# Patient Record
Sex: Male | Born: 1955 | Race: White | Hispanic: No | Marital: Married | Smoking: Former smoker
Health system: Southern US, Community
[De-identification: ages and names within clinical notes are randomized; demographics above are authoritative.]

## PROBLEM LIST (undated history)

## (undated) DIAGNOSIS — Z9289 Personal history of other medical treatment: Secondary | ICD-10-CM

## (undated) DIAGNOSIS — D473 Essential (hemorrhagic) thrombocythemia: Secondary | ICD-10-CM

## (undated) DIAGNOSIS — M1990 Unspecified osteoarthritis, unspecified site: Secondary | ICD-10-CM

## (undated) DIAGNOSIS — I82409 Acute embolism and thrombosis of unspecified deep veins of unspecified lower extremity: Secondary | ICD-10-CM

## (undated) DIAGNOSIS — F32A Depression, unspecified: Secondary | ICD-10-CM

## (undated) DIAGNOSIS — R161 Splenomegaly, not elsewhere classified: Secondary | ICD-10-CM

## (undated) DIAGNOSIS — I10 Essential (primary) hypertension: Secondary | ICD-10-CM

## (undated) DIAGNOSIS — N400 Enlarged prostate without lower urinary tract symptoms: Secondary | ICD-10-CM

## (undated) DIAGNOSIS — G4730 Sleep apnea, unspecified: Secondary | ICD-10-CM

## (undated) DIAGNOSIS — I739 Peripheral vascular disease, unspecified: Secondary | ICD-10-CM

## (undated) DIAGNOSIS — D75839 Thrombocytosis, unspecified: Secondary | ICD-10-CM

## (undated) DIAGNOSIS — H269 Unspecified cataract: Secondary | ICD-10-CM

## (undated) DIAGNOSIS — F419 Anxiety disorder, unspecified: Secondary | ICD-10-CM

## (undated) DIAGNOSIS — R51 Headache: Secondary | ICD-10-CM

## (undated) DIAGNOSIS — R519 Headache, unspecified: Secondary | ICD-10-CM

## (undated) DIAGNOSIS — K219 Gastro-esophageal reflux disease without esophagitis: Secondary | ICD-10-CM

## (undated) HISTORY — DX: Essential (hemorrhagic) thrombocythemia: D47.3

## (undated) HISTORY — PX: OTHER SURGICAL HISTORY: SHX169

## (undated) HISTORY — DX: Gastro-esophageal reflux disease without esophagitis: K21.9

## (undated) HISTORY — DX: Depression, unspecified: F32.A

## (undated) HISTORY — PX: JOINT REPLACEMENT: SHX530

## (undated) HISTORY — PX: TRIGGER FINGER RELEASE: SHX641

## (undated) HISTORY — PX: EYE SURGERY: SHX253

## (undated) HISTORY — PX: TONSILLECTOMY: SUR1361

## (undated) HISTORY — PX: NASAL SINUS SURGERY: SHX719

## (undated) HISTORY — DX: Benign prostatic hyperplasia without lower urinary tract symptoms: N40.0

## (undated) HISTORY — DX: Unspecified cataract: H26.9

## (undated) HISTORY — DX: Essential (primary) hypertension: I10

---

## 1999-03-26 DIAGNOSIS — I82409 Acute embolism and thrombosis of unspecified deep veins of unspecified lower extremity: Secondary | ICD-10-CM

## 1999-03-26 HISTORY — PX: KNEE SURGERY: SHX244

## 1999-03-26 HISTORY — DX: Acute embolism and thrombosis of unspecified deep veins of unspecified lower extremity: I82.409

## 2005-03-25 HISTORY — PX: ULNAR NERVE TRANSPOSITION: SHX2595

## 2006-03-25 DIAGNOSIS — Z9289 Personal history of other medical treatment: Secondary | ICD-10-CM

## 2006-03-25 HISTORY — DX: Personal history of other medical treatment: Z92.89

## 2011-08-12 ENCOUNTER — Encounter: Payer: Self-pay | Admitting: Family Medicine

## 2011-08-12 ENCOUNTER — Ambulatory Visit (INDEPENDENT_AMBULATORY_CARE_PROVIDER_SITE_OTHER): Payer: BC Managed Care – PPO | Admitting: Family Medicine

## 2011-08-12 VITALS — BP 112/50 | HR 88 | Temp 97.9°F | Ht 71.0 in | Wt 177.0 lb

## 2011-08-12 DIAGNOSIS — Z136 Encounter for screening for cardiovascular disorders: Secondary | ICD-10-CM

## 2011-08-12 DIAGNOSIS — D473 Essential (hemorrhagic) thrombocythemia: Secondary | ICD-10-CM | POA: Insufficient documentation

## 2011-08-12 DIAGNOSIS — I10 Essential (primary) hypertension: Secondary | ICD-10-CM

## 2011-08-12 DIAGNOSIS — N400 Enlarged prostate without lower urinary tract symptoms: Secondary | ICD-10-CM

## 2011-08-12 LAB — LIPID PANEL
HDL: 47.9 mg/dL (ref >39.00)
Total CHOL/HDL Ratio: 3
Triglycerides: 87 mg/dL (ref 0.0–149.0)
VLDL: 17.4 mg/dL (ref 0.0–40.0)

## 2011-08-12 LAB — CBC WITH DIFFERENTIAL/PLATELET
Basophils Absolute: 0.1 10*3/uL (ref 0.0–0.1)
Hemoglobin: 15.1 g/dL (ref 13.0–17.0)
Lymphocytes Relative: 10.3 % — ABNORMAL LOW (ref 12.0–46.0)
Monocytes Relative: 7 % (ref 3.0–12.0)
Neutro Abs: 5.5 10*3/uL (ref 1.4–7.7)
Neutrophils Relative %: 78.6 % — ABNORMAL HIGH (ref 43.0–77.0)
RDW: 13.4 % (ref 11.5–14.6)

## 2011-08-12 LAB — COMPREHENSIVE METABOLIC PANEL
ALT: 14 U/L (ref 0–53)
AST: 18 U/L (ref 0–37)
BUN: 24 mg/dL — ABNORMAL HIGH (ref 6–23)
Creatinine, Ser: 1 mg/dL (ref 0.4–1.5)
GFR: 79.07 mL/min (ref >60.00)
Total Bilirubin: 0.8 mg/dL (ref 0.3–1.2)

## 2011-08-12 MED ORDER — DOXAZOSIN MESYLATE 1 MG PO TABS: 1.0000 mg | ORAL_TABLET | Freq: Every day | ORAL | Status: DC

## 2011-08-12 MED ORDER — HYDROXYUREA 500 MG PO CAPS: ORAL_CAPSULE | ORAL | Status: DC

## 2011-08-12 MED ORDER — LISINOPRIL 10 MG PO TABS: 10.0000 mg | ORAL_TABLET | Freq: Every day | ORAL | Status: DC

## 2011-08-12 MED ORDER — NAPROXEN 500 MG PO TABS: 500.0000 mg | ORAL_TABLET | Freq: Two times a day (BID) | ORAL | Status: DC

## 2011-08-12 NOTE — Progress Notes (Signed)
Subjective:    Patient ID: Dennis Martin, male    DOB: 1955/10/12, 56 y.o.   MRN: [id number]  HPI  56 yo here to establish care.  Recently moved here from [facility] for his job-works for [employer].    Essential Thrombocytosis- diagnosed in 2006. Takes hydroxyurea. CBC has been stable.  Does not have a local hematologist- his platelets have been in 300-400s.  HTN- on lisinopril 10 mg daily. Denies any HA, blurred vision, dizziness with standing, LE edema, CP or SOB.  BPH- has been on cardura 1 mg for years- feels it works "ok."  Emptying bladder more effectively than he was prior to using it.  Patient Active Problem List  Diagnoses  . Essential thrombocytosis   Past Medical History  Diagnosis Date  . BPH (benign prostatic hyperplasia)    Past Surgical History  Procedure Date  . Total knee arthroplasty 2001    bilateral   History  Substance Use Topics  . Smoking status: Former Games developer  . Smokeless tobacco: Not on file   Comment: Quit 2005  . Alcohol Use: Not on file   No family history on file. No Known Allergies Current Outpatient Prescriptions on File Prior to Visit  Medication Sig Dispense Refill  . doxazosin (CARDURA) 1 MG tablet Take 1 tablet (1 mg total) by mouth at bedtime.  90 tablet  3  . lisinopril (PRINIVIL,ZESTRIL) 10 MG tablet Take 1 tablet (10 mg total) by mouth daily.  90 tablet  3  . loratadine (CLARITIN) 10 MG tablet Take 10 mg by mouth daily.       The PMH, PSH, Social History, Family History, Medications, and allergies have been reviewed in [facility], and have been updated if relevant.   Review of Systems See HPI Patient reports no  vision/ hearing changes,anorexia, weight change, fever ,adenopathy, persistant / recurrent hoarseness, swallowing issues, chest pain, edema,persistant / recurrent cough, hemoptysis, dyspnea(rest, exertional, paroxysmal nocturnal), gastrointestinal  bleeding (melena, rectal bleeding), abdominal pain, excessive heart burn, GU  symptoms(dysuria, hematuria, pyuria, voiding/incontinence  Issues) syncope, focal weakness, severe memory loss, concerning skin lesions, depression, anxiety, abnormal bruising/bleeding, major joint swelling.       Objective:   Physical Exam BP 112/50  Pulse 88  Temp(Src) 97.9 F (36.6 C) (Oral)  Ht 5\' 11"  (1.803 m)  Wt 177 lb (80.287 kg)  BMI 24.69 kg/m2 General:  pleasant male in NAD Eyes:  PERRL Ears:  External ear exam shows no significant lesions or deformities.  Otoscopic examination reveals clear canals, tympanic membranes are intact bilaterally without bulging, retraction, inflammation or discharge. Hearing is grossly normal bilaterally. Nose:  External nasal examination shows no deformity or inflammation. Nasal mucosa are pink and moist without lesions or exudates. Mouth:  Oral mucosa and oropharynx without lesions or exudates.  Teeth in good repair. Neck:  no carotid bruit or thyromegaly no cervical or supraclavicular lymphadenopathy  Lungs:  Normal respiratory effort, chest expands symmetrically. Lungs are clear to auscultation, no crackles or wheezes. Heart:  Normal rate and regular rhythm. S1 and S2 normal without gallop, murmur, click, rub or other extra sounds. Abdomen:  Bowel sounds positive,abdomen soft and non-tender without masses, organomegaly or hernias noted. Pulses:  R and L posterior tibial pulses are full and equal bilaterally  Extremities:  no edema     Assessment & Plan:   1. Essential thrombocytosis  Stable, refilled hydroxyurea.  Refer to hematology so he can establish with local hematologist.  CBC today. CBC with Differential, Ambulatory referral to Hematology,  Comprehensive metabolic panel  2. Screening for ischemic heart disease  Lipid Panel  3. HTN (hypertension)  Stable on current dose of lisinopril.   4. BPH (benign prostatic hyperplasia)  Stable with cardura.

## 2011-08-12 NOTE — Patient Instructions (Signed)
Wonderful to meet you. Please stop by to see Shirlee Limerick on your way out to set up your hematology referral. Make an appointment to see Dr. Patsy Lager for hip pain at the front desk. We will call you with your lab work.

## 2011-08-15 ENCOUNTER — Telehealth: Payer: Self-pay

## 2011-08-15 NOTE — Telephone Encounter (Signed)
Noted- quantity should be 12. Thank you.

## 2011-08-15 NOTE — Telephone Encounter (Signed)
Dennis Martin  [facility] request clarification on Hydrea instructions one by mouth on Fridays but quantity was 90.Please advise. Ref # [id number].

## 2011-08-16 ENCOUNTER — Telehealth: Payer: Self-pay | Admitting: Internal Medicine

## 2011-08-16 NOTE — Telephone Encounter (Signed)
l/m re:new pt appt  aom 

## 2011-08-16 NOTE — Telephone Encounter (Signed)
pt called back and made appt for 8/12-his req,he req this as he just had lab work and this is to estab care.

## 2011-08-21 ENCOUNTER — Other Ambulatory Visit: Payer: Self-pay | Admitting: *Deleted

## 2011-08-21 MED ORDER — HYDROXYUREA 500 MG PO CAPS: ORAL_CAPSULE | ORAL | Status: DC

## 2011-08-21 NOTE — Telephone Encounter (Signed)
Thank you :)

## 2011-08-21 NOTE — Telephone Encounter (Signed)
There has been some confusion on how pt takes hyroxyurea.  Chart has that he takes one on fridays, which is what was understood when he gave [place] his medicine information at first visit, but he says he takes one daily and takes two on fridays, for a total of 102 for a 90 day period.  We have already sent in 12, so today I am sending in 90 to the pharmacy, with refills.

## 2011-08-22 NOTE — Telephone Encounter (Signed)
Ethelene Browns pharmacist with [facility] called & confirmed cancelling Hyroxyurea one tab on Friday and update with new rx 1 daily and 2 on Fridays(rx received on 08/21/11.)

## 2011-08-26 ENCOUNTER — Encounter: Payer: Self-pay | Admitting: Family Medicine

## 2011-08-26 ENCOUNTER — Telehealth: Payer: Self-pay | Admitting: Internal Medicine

## 2011-08-26 ENCOUNTER — Ambulatory Visit (INDEPENDENT_AMBULATORY_CARE_PROVIDER_SITE_OTHER): Payer: BC Managed Care – PPO | Admitting: Family Medicine

## 2011-08-26 VITALS — BP 120/78 | HR 75 | Temp 97.6°F | Ht 71.0 in | Wt 176.0 lb

## 2011-08-26 DIAGNOSIS — M76899 Other specified enthesopathies of unspecified lower limb, excluding foot: Secondary | ICD-10-CM

## 2011-08-26 DIAGNOSIS — G5700 Lesion of sciatic nerve, unspecified lower limb: Secondary | ICD-10-CM

## 2011-08-26 DIAGNOSIS — M67959 Unspecified disorder of synovium and tendon, unspecified thigh: Secondary | ICD-10-CM

## 2011-08-26 DIAGNOSIS — M7060 Trochanteric bursitis, unspecified hip: Secondary | ICD-10-CM

## 2011-08-26 MED ORDER — DICLOFENAC SODIUM 75 MG PO TBEC: 75.0000 mg | DELAYED_RELEASE_TABLET | Freq: Two times a day (BID) | ORAL | Status: DC

## 2011-08-26 NOTE — Patient Instructions (Signed)
PIRIFORMIS SYNDROME REHAB 1. Work on pretzel stretching, shoulder back and leg draped in front. 3-5 sets, 30 sec.Marland Kitchen Hip Abductions - 3 sets of 15, then progress to 30 2. hip abductor rotations. standing, hip flexion and rotation outward then inward. 3 sets, 15 reps. when can do comfortably, add ankle weights starting at 2 pounds.  3. cross over stretching - shoulder back to ground, same side leg crossover. 3-5 sets for 30 min..  4. SINK STRETCH - YOU CAN DO THIS WHENEVER YOU WANT DURING THE DAY  Tennis ball underneath area in buttocks - on a hard surface underneath Can also massage this area with an Hydrologist or hand

## 2011-08-26 NOTE — Progress Notes (Signed)
HEALTHCARE at [facility]  Patient Name: Dennis Martin Date of Birth: 05/26/1955 Medical Record Number: [id number] Gender: male Date of Encounter: 08/26/2011  History of Present Illness:  Dennis Martin is a 56 y.o. very pleasant male patient who presents with the following:  Normally will sleep in his left hip and then starteed on the right. About a month ago, then would alternate left and right.  Pressure on the left  Patient presents and has been having symptoms that began more on the R, then alternating L and R. Posteriorly mostly, and then lateral. No back pain. No trauma. No radiculopathy. No groin pain.  Works at [employer] 8 hours a day. 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   Has taken some naprosyn  Patient Active Problem List  Diagnoses  . Essential thrombocytosis  . HTN (hypertension)  . BPH (benign prostatic hyperplasia)   Past Medical History  Diagnosis Date  . BPH (benign prostatic hyperplasia)    Past Surgical History  Procedure Date  . Total knee arthroplasty 2001    bilateral   History  Substance Use Topics  . Smoking status: Former Games developer  . Smokeless tobacco: Not on file   Comment: Quit 2005  . Alcohol Use: Not on file   No family history on file. No Known Allergies  Medication list has been reviewed and updated.  Prior to Admission medications   Medication Sig Start Date End Date Taking? Authorizing Provider  aspirin 325 MG tablet Take 325 mg by mouth daily.    Historical Provider, MD  doxazosin (CARDURA) 1 MG tablet Take 1 tablet (1 mg total) by mouth at bedtime. 08/12/11   Dianne Dun, MD  hydroxyurea (HYDREA) 500 MG capsule Take one by mouth daily and two by mouth on fridays. 08/21/11   Dianne Dun, MD  lisinopril (PRINIVIL,ZESTRIL) 10 MG tablet Take 1 tablet (10 mg total) by mouth daily. 08/12/11   Dianne Dun, MD  loratadine (CLARITIN) 10 MG tablet Take 10 mg by mouth daily.    Historical Provider, MD  Multiple Vitamins-Minerals (CENTRUM  SILVER PO) Take by mouth. Take one by mouth daily.    Historical Provider, MD  naproxen (NAPROSYN) 500 MG tablet Take 1 tablet (500 mg total) by mouth 2 (two) times daily with a meal. 08/12/11   Dianne Dun, MD    Review of Systems:   GEN: No fevers, chills. Nontoxic. Primarily MSK c/o today. MSK: Detailed in the HPI GI: tolerating PO intake without difficulty Neuro: No numbness, parasthesias, or tingling associated. Otherwise the pertinent positives of the ROS are noted above.    Physical Examination: Filed Vitals:   08/26/11 0802  BP: 120/78  Pulse: 75  Temp: 97.6 F (36.4 C)   Filed Vitals:   08/26/11 0802  Height: 5\' 11"  (1.803 m)  Weight: 176 lb (79.833 kg)   Body mass index is 24.55 kg/(m^2).   GEN: WDWN, NAD, Non-toxic, Alert & Oriented x 3 HEENT: Atraumatic, Normocephalic.  Ears and Nose: No external deformity. EXTR: No clubbing/cyanosis/edema NEURO: Normal gait.  PSYCH: Normally interactive. Conversant. Not depressed or anxious appearing.  Calm demeanor.   HIP EXAM: SIDE: b ROM: Abduction, Flexion, Internal and External range of motion: excellent and full Pain with terminal IROM and EROM: no GTB: mild GTB on L SLR: NEG Knees: No effusion FABER: NT REVERSE FABER: NT, neg Piriformis: NT at direct palpation Str: flexion: 5/5 abduction: 5/5 adduction: 5/5 Strength testing non-tender   Back: full  ROM, neurovascularly intact, DTR 2+, pulses 2+.  SLR  Assessment and Plan:  1. Piriformis syndrome   2. Trochanteric bursitis   3. Tendinopathy of gluteal region    >25 minutes spent in face to face time with patient, >50% spent in counselling or coordination of care: discussion of anatomy, pathophys, rehab. Mild all of above, suspect irritating sciatic nerve some with sitting and piriformis. Deconditioning contributing  Reviewed rehab from [facility] F/u 6 weeks if not better  Orders Today: No orders of the defined types were placed in this encounter.      Medications Today: Meds ordered this encounter  Medications  . diclofenac (VOLTAREN) 75 MG EC tablet    Sig: Take 1 tablet (75 mg total) by mouth 2 (two) times daily.    Dispense:  60 tablet    Refill:  3     Hannah Beat, MD 08/26/2011 8:12 AM

## 2011-08-26 NOTE — Telephone Encounter (Signed)
Referred by Dr. [name] Dx- Thombocytosis

## 2011-09-12 NOTE — Telephone Encounter (Signed)
Can this encounter be closed?

## 2011-09-13 NOTE — Telephone Encounter (Signed)
This has been taken care of.

## 2011-10-30 ENCOUNTER — Telehealth: Payer: Self-pay | Admitting: Internal Medicine

## 2011-10-30 NOTE — Telephone Encounter (Signed)
pt called and r/s new pt appt to 8/19

## 2011-11-04 ENCOUNTER — Other Ambulatory Visit: Payer: BC Managed Care – PPO | Admitting: Lab

## 2011-11-04 ENCOUNTER — Ambulatory Visit: Payer: BC Managed Care – PPO

## 2011-11-04 ENCOUNTER — Ambulatory Visit: Payer: BC Managed Care – PPO | Admitting: Internal Medicine

## 2011-11-11 ENCOUNTER — Telehealth: Payer: Self-pay | Admitting: Internal Medicine

## 2011-11-11 ENCOUNTER — Ambulatory Visit (HOSPITAL_BASED_OUTPATIENT_CLINIC_OR_DEPARTMENT_OTHER): Payer: BC Managed Care – PPO | Admitting: Internal Medicine

## 2011-11-11 ENCOUNTER — Other Ambulatory Visit (HOSPITAL_BASED_OUTPATIENT_CLINIC_OR_DEPARTMENT_OTHER): Payer: BC Managed Care – PPO

## 2011-11-11 ENCOUNTER — Ambulatory Visit: Payer: BC Managed Care – PPO

## 2011-11-11 VITALS — BP 113/72 | HR 68 | Temp 96.7°F | Resp 20 | Ht 71.0 in | Wt 179.8 lb

## 2011-11-11 DIAGNOSIS — D473 Essential (hemorrhagic) thrombocythemia: Secondary | ICD-10-CM

## 2011-11-11 DIAGNOSIS — I10 Essential (primary) hypertension: Secondary | ICD-10-CM

## 2011-11-11 LAB — COMPREHENSIVE METABOLIC PANEL
ALT: 14 U/L (ref 0–53)
AST: 18 U/L (ref 0–37)
Albumin: 4.1 g/dL (ref 3.5–5.2)
BUN: 27 mg/dL — ABNORMAL HIGH (ref 6–23)
Calcium: 9.5 mg/dL (ref 8.4–10.5)
Chloride: 101 mEq/L (ref 96–112)
Potassium: 4.9 mEq/L (ref 3.5–5.3)
Sodium: 140 mEq/L (ref 135–145)
Total Protein: 6 g/dL (ref 6.0–8.3)

## 2011-11-11 LAB — CBC WITH DIFFERENTIAL/PLATELET
BASO%: 0.2 % (ref 0.0–2.0)
Basophils Absolute: 0 10*3/uL (ref 0.0–0.1)
MCH: 36.6 pg — ABNORMAL HIGH (ref 27.2–33.4)
MCV: 107.1 fL — ABNORMAL HIGH (ref 79.3–98.0)
MONO#: 0.6 10*3/uL (ref 0.1–0.9)
NEUT#: 5.2 10*3/uL (ref 1.5–6.5)
NEUT%: 73 % (ref 39.0–75.0)
Platelets: 382 10*3/uL (ref 140–400)
RBC: 4.29 10*6/uL (ref 4.20–5.82)
RDW: 12.4 % (ref 11.0–14.6)

## 2011-11-11 NOTE — Telephone Encounter (Signed)
appts made and printed for pt aom °

## 2011-11-11 NOTE — Progress Notes (Signed)
Dennis Martin [phone]   [phone]  CONSULT NOTE  REASON FOR CONSULTATION:  56 years old white male with essential thrombocythemia.   HPI Dennis Martin is a 56 y.o. male was past medical history significant for hypertension, benign prostatic hypertrophy as well as essential thrombocythemia diagnosed in 2006 under the care of Dr. Mariah Milling in [facility] after having a bone marrow biopsy and aspirate as the patient was complaining of severe pain and heaviness in his feet. Blood work at that time showed elevated platelets count. He was started on hydroxyurea and for the last 4 years his dose has been 500 mg by mouth daily except Friday he receives 1000 mg. He has been doing fine and his platelets count has always been less than 400,000 over the last 4 years. He moved recently to [facility] and establish care with a primary care physician, Dr. Kerin Perna.  He recommended for him to establish care with a local hematologist for evaluation of his essential thrombocythemia. The patient is feeling fine with no specific complaints today except for occasional night sweats. He has no significant adverse effect from his hydroxyurea.  He denied having any significant nausea or vomiting, no chest pain or shortness breath, no cough or hemoptysis. The patient denied having any bleeding or bruises, he denied having any significant weight loss. He has a history of splenomegaly and occasional early satiety. Family history unremarkable for any malignancy. The patient is married and has 2 sons. He has a history of smoking but quit in 2005. He drinks alcohol on daily basis but no history of drug abuse. @SFHPI @  Past Medical History  Diagnosis Date  . BPH (benign prostatic hyperplasia)     Past Surgical History  Procedure Date  . Total knee arthroplasty 2001    bilateral    No family history on file.  Social History History  Substance Use Topics  .  Smoking status: Former Games developer  . Smokeless tobacco: Not on file   Comment: Quit 2005  . Alcohol Use: Not on file    No Known Allergies  Current Outpatient Prescriptions  Medication Sig Dispense Refill  . aspirin 325 MG tablet Take 325 mg by mouth daily.      Marland Kitchen doxazosin (CARDURA) 1 MG tablet Take 1 tablet (1 mg total) by mouth at bedtime.  90 tablet  3  . hydroxyurea (HYDREA) 500 MG capsule Take one by mouth daily and two by mouth on fridays.  90 capsule  3  . lisinopril (PRINIVIL,ZESTRIL) 10 MG tablet Take 1 tablet (10 mg total) by mouth daily.  90 tablet  3  . loratadine (CLARITIN) 10 MG tablet Take 10 mg by mouth daily.      . Multiple Vitamins-Minerals (CENTRUM SILVER PO) Take by mouth. Take one by mouth daily.      . naproxen (NAPROSYN) 500 MG tablet Take 1 tablet (500 mg total) by mouth 2 (two) times daily with a meal.  180 tablet  3    Review of Systems  A comprehensive review of systems was negative.  Physical Exam  AVW:UJWJX, healthy, no distress, well nourished and well developed SKIN: skin color, texture, turgor are normal HEAD: Normocephalic, No masses, lesions, tenderness or abnormalities EYES: normal EARS: External ears normal OROPHARYNX:no exudate and no erythema  NECK: supple, no adenopathy LYMPH:  no palpable lymphadenopathy, no hepatosplenomegaly LUNGS: clear to auscultation  HEART: regular rate & rhythm, no murmurs and no gallops ABDOMEN:abdomen soft, non-tender,  normal bowel sounds and no masses or organomegaly BACK: Back symmetric, no curvature. EXTREMITIES:no joint deformities, effusion, or inflammation, no edema, no skin discoloration, no clubbing, no cyanosis  NEURO: alert & oriented x 3 with fluent speech, no focal motor/sensory deficits  PERFORMANCE STATUS: ECOG 0  LABORATORY DATA: Lab Results  Component Value Date   WBC 7.1 11/11/2011   HGB 15.7 11/11/2011   HCT 45.9 11/11/2011   MCV 107.1* 11/11/2011   PLT 382 11/11/2011      Chemistry        Component Value Date/Time   NA 140 08/12/2011 1425   K 4.2 08/12/2011 1425   CL 102 08/12/2011 1425   CO2 31 08/12/2011 1425   BUN 24* 08/12/2011 1425   CREATININE 1.0 08/12/2011 1425      Component Value Date/Time   CALCIUM 9.0 08/12/2011 1425   ALKPHOS 49 08/12/2011 1425   AST 18 08/12/2011 1425   ALT 14 08/12/2011 1425   BILITOT 0.8 08/12/2011 1425       RADIOGRAPHIC STUDIES: No results found.  ASSESSMENT: This is a very pleasant 56 years old white male with history of essential thrombocythemia diagnosed in 2006 and has been on treatment with hydroxyurea for the last 7 years. The patient is tolerating his treatment fairly well with no significant adverse effect and good control of his platelets count.  PLAN: I ordered a few studies today for evaluation of his essential thrombocythemia including repeat CBC, comprehensive metabolic panel, LDH and JAK-2 mutation. I advised the patient to continue on his current dose of hydroxyurea 500 mg by mouth daily except Friday he will take 1000 mg. The patient will continue with close monitoring and repeat CBC on every 3 month basis. He would alternate visits between me and his primary care physician every 6 months. I would see the patient back for followup visit in 6 months with repeat CBC, comprehensive metabolic panel and LDH. He was advised to call me immediately if he has any concerning symptoms in the interval. I gave the patient the time to ask questions and I answered them completely to his satisfaction. He understands the risk of secondary malignancy from treatment with hydroxyurea as well as the possibility of developing other myeloproliferative disorder secondary to his essential thrombocythemia. He was advised to report to me any concerning symptoms or signs.  All questions were answered. The patient knows to call the clinic with any problems, questions or concerns. We can certainly see the patient much sooner if necessary.  Thank you so  much for allowing me to participate in the care of [name]. I will continue to follow up the patient with you and assist in his care.  I spent 30 minutes counseling the patient face to face. The total time spent in the appointment was 55 minutes.  Nancy Arvin K. 11/11/2011, 2:46 PM

## 2011-12-25 ENCOUNTER — Ambulatory Visit (INDEPENDENT_AMBULATORY_CARE_PROVIDER_SITE_OTHER): Payer: BC Managed Care – PPO

## 2011-12-25 DIAGNOSIS — Z23 Encounter for immunization: Secondary | ICD-10-CM

## 2012-02-10 ENCOUNTER — Ambulatory Visit (INDEPENDENT_AMBULATORY_CARE_PROVIDER_SITE_OTHER): Payer: BC Managed Care – PPO | Admitting: Family Medicine

## 2012-02-10 ENCOUNTER — Encounter: Payer: Self-pay | Admitting: Family Medicine

## 2012-02-10 VITALS — BP 128/72 | HR 80 | Temp 97.6°F | Wt 185.0 lb

## 2012-02-10 DIAGNOSIS — I10 Essential (primary) hypertension: Secondary | ICD-10-CM

## 2012-02-10 DIAGNOSIS — D473 Essential (hemorrhagic) thrombocythemia: Secondary | ICD-10-CM

## 2012-02-10 DIAGNOSIS — N400 Enlarged prostate without lower urinary tract symptoms: Secondary | ICD-10-CM

## 2012-02-10 LAB — CBC WITH DIFFERENTIAL/PLATELET
Basophils Absolute: 0.1 10*3/uL (ref 0.0–0.1)
Basophils Relative: 0.9 % (ref 0.0–3.0)
Eosinophils Absolute: 0.3 10*3/uL (ref 0.0–0.7)
Hemoglobin: 14.9 g/dL (ref 13.0–17.0)
MCHC: 33.3 g/dL (ref 30.0–36.0)
MCV: 107.1 fl — ABNORMAL HIGH (ref 78.0–100.0)
Monocytes Absolute: 0.6 10*3/uL (ref 0.1–1.0)
Neutro Abs: 4.4 10*3/uL (ref 1.4–7.7)
RBC: 4.18 Mil/uL — ABNORMAL LOW (ref 4.22–5.81)
RDW: 12.8 % (ref 11.5–14.6)

## 2012-02-10 MED ORDER — KETOCONAZOLE 2 % EX SHAM: MEDICATED_SHAMPOO | CUTANEOUS | Status: DC

## 2012-02-10 MED ORDER — DESONIDE 0.05 % EX CREA: TOPICAL_CREAM | Freq: Two times a day (BID) | CUTANEOUS | Status: DC

## 2012-02-10 NOTE — Patient Instructions (Addendum)
Seborrheic Keratosis  Seborrheic keratosis is a common, noncancerous (benign) skin growth that can occur anywhere on the skin. It looks like "stuck-on," waxy, rough, tan, brown, or black spots on the skin. These skin growths can be flat or raised. They are often called "barnacles" because of their pasted-on appearance. Usually, these skin growths appear in adulthood, around age 54, and increase in number as you age. They may also develop during pregnancy or following estrogen therapy. Many people may only have one growth appear in their lifetime, while some people may develop many growths.  CAUSES  It is unknown what causes these skin growths, but they appear to run in families.  SYMPTOMS  Seborrheic keratosis is often located on the face, chest, shoulders, back, or other areas. These growths are:  · Usually painless, but may become irritated and itchy.  · Yellow, brown, black, or other colors.  · Slightly raised or have a flat surface.  · Sometimes rough or wart-like in texture.  · Often waxy on the surface.  · Round or oval-shaped.  · Sometimes "stuck-on" in appearance.  · Sometimes single, but there are usually many growths.  Any growth that bleeds, itches on a regular basis, becomes inflamed, or becomes irritated needs to be evaluated by a skin specialist (dermatologist).  DIAGNOSIS  Diagnosis is mainly based on the way the growths appear. In some cases, it can be difficult to tell this type of skin growth from skin cancer. A skin growth tissue sample (biopsy)  may be used to confirm the diagnosis.  TREATMENT  Most often, treatment is not needed because the skin growths are benign. If the skin growth is irritated easily by clothing or jewelry, causing it to scab or bleed, treatment may be recommended. Patients may also choose to have the growths removed because they do not like their appearance. Most commonly, these growths are treated with cryosurgery.  In cryosurgery, liquid nitrogen is applied to "freeze" the  growth. The growth usually falls off within a matter of days. A blister may form and dry into a scab that will also fall off. After the growth or scab falls off, it may leave a dark or light spot on the skin. This color may fade over time, or it may remain permanent on the skin.  HOME CARE INSTRUCTIONS  If the skin growths are treated with cryosurgery, the treated area needs to be kept clean with water and soap.  SEEK MEDICAL CARE IF:  · You have questions about these growths or other skin problems.  · You develop new symptoms, including:  · A change in the appearance of the skin growth.  · New growths.  · Any bleeding, itching, or pain in the growths.  · A skin growth that looks similar to seborrheic keratosis.  Document Released: 04/13/2010 Document Revised: 06/03/2011 Document Reviewed: 04/13/2010  Exit[employer]® Patient Information ©2013 [employer].

## 2012-02-10 NOTE — Progress Notes (Signed)
Subjective:    Patient ID: Dennis Martin, male    DOB: 1955/05/13, 56 y.o.   MRN: [id number]  HPI  56 yo here for follow up.     Essential Thrombocytosis- diagnosed in 2006. Takes hydroxyurea. CBC has been stable. Followed by hematology (Dr. Shirline Frees)- his platelets have been in 300-400s. Lab Results  Component Value Date   WBC 7.1 11/11/2011   HGB 15.7 11/11/2011   HCT 45.9 11/11/2011   MCV 107.1* 11/11/2011   PLT 382 11/11/2011     HTN- on lisinopril 10 mg daily. Denies any HA, blurred vision, dizziness with standing, LE edema, CP or SOB. Lab Results  Component Value Date   CREATININE 1.01 11/11/2011     BPH- has been on cardura 1 mg for years.   Emptying bladder more effectively than he was prior to using it.  Patient Active Problem List  Diagnosis  . Essential thrombocytosis  . HTN (hypertension)  . BPH (benign prostatic hyperplasia)   Past Medical History  Diagnosis Date  . BPH (benign prostatic hyperplasia)    Past Surgical History  Procedure Date  . Total knee arthroplasty 2001    bilateral   History  Substance Use Topics  . Smoking status: Former Games developer  . Smokeless tobacco: Not on file     Comment: Quit 2005  . Alcohol Use: Not on file   No family history on file. No Known Allergies Current Outpatient Prescriptions on File Prior to Visit  Medication Sig Dispense Refill  . aspirin 325 MG tablet Take 325 mg by mouth daily.      Marland Kitchen doxazosin (CARDURA) 1 MG tablet Take 1 tablet (1 mg total) by mouth at bedtime.  90 tablet  3  . hydroxyurea (HYDREA) 500 MG capsule Take one by mouth daily and two by mouth on fridays.  90 capsule  3  . lisinopril (PRINIVIL,ZESTRIL) 10 MG tablet Take 1 tablet (10 mg total) by mouth daily.  90 tablet  3  . loratadine (CLARITIN) 10 MG tablet Take 10 mg by mouth daily.      . Multiple Vitamins-Minerals (CENTRUM SILVER PO) Take by mouth. Take one by mouth daily.      . naproxen (NAPROSYN) 500 MG tablet Take 1 tablet (500 mg total)  by mouth 2 (two) times daily with a meal.  180 tablet  3   The PMH, PSH, Social History, Family History, Medications, and allergies have been reviewed in [facility], and have been updated if relevant.   Review of Systems See HPI Patient reports no  vision/ hearing changes,anorexia, weight change, fever ,adenopathy, persistant / recurrent hoarseness, swallowing issues, chest pain, edema,persistant / recurrent cough, hemoptysis, dyspnea(rest, exertional, paroxysmal nocturnal), gastrointestinal  bleeding (melena, rectal bleeding), abdominal pain, excessive heart burn, GU symptoms(dysuria, hematuria, pyuria, voiding/incontinence  Issues) syncope, focal weakness, severe memory loss, concerning skin lesions, depression, anxiety, abnormal bruising/bleeding, major joint swelling.       Objective:   Physical Exam BP 128/72  Pulse 80  Temp 97.6 F (36.4 C)  Wt 185 lb (83.915 kg) Wt Readings from Last 3 Encounters:  02/10/12 185 lb (83.915 kg)  11/11/11 179 lb 12.8 oz (81.557 kg)  08/26/11 176 lb (79.833 kg)    General:  pleasant male in NAD Eyes:  PERRL Ears:  External ear exam shows no significant lesions or deformities.  Otoscopic examination reveals clear canals, tympanic membranes are intact bilaterally without bulging, retraction, inflammation or discharge. Hearing is grossly normal bilaterally. Nose:  External nasal examination shows  no deformity or inflammation. Nasal mucosa are pink and moist without lesions or exudates. Mouth:  Oral mucosa and oropharynx without lesions or exudates.  Teeth in good repair. Neck:  no carotid bruit or thyromegaly no cervical or supraclavicular lymphadenopathy  Lungs:  Normal respiratory effort, chest expands symmetrically. Lungs are clear to auscultation, no crackles or wheezes. Heart:  Normal rate and regular rhythm. S1 and S2 normal without gallop, murmur, click, rub or other extra sounds. Abdomen:  Bowel sounds positive,abdomen soft and non-tender without  masses, organomegaly or hernias noted. Pulses:  R and L posterior tibial pulses are full and equal bilaterally  Extremities:  no edema     Assessment & Plan:   1. Essential thrombocytosis  Stable,  CBC today. Continue current meds. CBC  2. HTN (hypertension)  Stable on current dose of lisinopril.   3. BPH (benign prostatic hyperplasia)  Stable with cardura.

## 2012-02-13 ENCOUNTER — Encounter: Payer: Self-pay | Admitting: *Deleted

## 2012-04-14 ENCOUNTER — Encounter: Payer: Self-pay | Admitting: Internal Medicine

## 2012-04-14 ENCOUNTER — Ambulatory Visit (INDEPENDENT_AMBULATORY_CARE_PROVIDER_SITE_OTHER): Payer: BC Managed Care – PPO | Admitting: Family Medicine

## 2012-04-14 ENCOUNTER — Encounter: Payer: Self-pay | Admitting: Family Medicine

## 2012-04-14 VITALS — BP 122/72 | HR 80 | Temp 97.5°F | Ht 71.25 in | Wt 184.0 lb

## 2012-04-14 DIAGNOSIS — Z125 Encounter for screening for malignant neoplasm of prostate: Secondary | ICD-10-CM

## 2012-04-14 DIAGNOSIS — Z136 Encounter for screening for cardiovascular disorders: Secondary | ICD-10-CM

## 2012-04-14 DIAGNOSIS — Z1211 Encounter for screening for malignant neoplasm of colon: Secondary | ICD-10-CM

## 2012-04-14 DIAGNOSIS — N400 Enlarged prostate without lower urinary tract symptoms: Secondary | ICD-10-CM

## 2012-04-14 DIAGNOSIS — Z0000 Encounter for general adult medical examination without abnormal findings: Secondary | ICD-10-CM

## 2012-04-14 DIAGNOSIS — I10 Essential (primary) hypertension: Secondary | ICD-10-CM

## 2012-04-14 DIAGNOSIS — D473 Essential (hemorrhagic) thrombocythemia: Secondary | ICD-10-CM

## 2012-04-14 LAB — LIPID PANEL
HDL: 46.7 mg/dL (ref >39.00)
LDL Cholesterol: 96 mg/dL (ref 0–99)
Total CHOL/HDL Ratio: 3
Triglycerides: 62 mg/dL (ref 0.0–149.0)
VLDL: 12.4 mg/dL (ref 0.0–40.0)

## 2012-04-14 LAB — COMPREHENSIVE METABOLIC PANEL
AST: 21 U/L (ref 0–37)
Alkaline Phosphatase: 57 U/L (ref 39–117)
BUN: 31 mg/dL — ABNORMAL HIGH (ref 6–23)
Creatinine, Ser: 1 mg/dL (ref 0.4–1.5)
Total Bilirubin: 1 mg/dL (ref 0.3–1.2)

## 2012-04-14 NOTE — Patient Instructions (Addendum)
We will call you with your lab results.  Everything looked great. Have a great week!  Please stop by to see Shirlee Limerick on your way out to set up your colonoscopy.

## 2012-04-14 NOTE — Progress Notes (Signed)
Subjective:    Patient ID: Dennis Martin, male    DOB: Mar 29, 1955, 57 y.o.   MRN: [id number]  HPI  Pleasant 57 yo male here for CPX.  Has never had a colonoscopy.  Essential Thrombocytosis- diagnosed in 2006. Takes hydroxyurea. CBC has been stable. Followed by hematology (Dr. Shirline Frees)- his platelets have been in 300-400s. Lab Results  Component Value Date   WBC 6.3 02/10/2012   HGB 14.9 02/10/2012   HCT 44.8 02/10/2012   MCV 107.1* 02/10/2012   PLT 423.0* 02/10/2012     HTN- on lisinopril 10 mg daily. Denies any HA, blurred vision, dizziness with standing, LE edema, CP or SOB. Lab Results  Component Value Date   CREATININE 1.01 11/11/2011    BPH- has been on cardura 1 mg for years.   Emptying bladder more effectively than he was prior to using it.  He has no complaints.  No family h/o prostate CA.  Patient Active Problem List  Diagnosis  . Essential thrombocytosis  . HTN (hypertension)  . BPH (benign prostatic hyperplasia)  . Routine general medical examination at a health care facility   Past Medical History  Diagnosis Date  . BPH (benign prostatic hyperplasia)    Past Surgical History  Procedure Date  . Total knee arthroplasty 2001    bilateral   History  Substance Use Topics  . Smoking status: Former Games developer  . Smokeless tobacco: Not on file     Comment: Quit 2005  . Alcohol Use: Not on file   No family history on file. No Known Allergies Current Outpatient Prescriptions on File Prior to Visit  Medication Sig Dispense Refill  . aspirin 325 MG tablet Take 325 mg by mouth daily.      Marland Kitchen desonide (DESOWEN) 0.05 % cream Apply topically 2 (two) times daily.  30 g  3  . doxazosin (CARDURA) 1 MG tablet Take 1 tablet (1 mg total) by mouth at bedtime.  90 tablet  3  . hydroxyurea (HYDREA) 500 MG capsule Take one by mouth daily and two by mouth on fridays.  90 capsule  3  . ketoconazole (NIZORAL) 2 % cream daily.      Marland Kitchen ketoconazole (NIZORAL) 2 % shampoo Apply  topically 2 (two) times a week.  120 mL  3  . lisinopril (PRINIVIL,ZESTRIL) 10 MG tablet Take 1 tablet (10 mg total) by mouth daily.  90 tablet  3  . loratadine (CLARITIN) 10 MG tablet Take 10 mg by mouth daily.      . Multiple Vitamins-Minerals (CENTRUM SILVER PO) Take by mouth. Take one by mouth daily.      . naproxen (NAPROSYN) 500 MG tablet Take 1 tablet (500 mg total) by mouth 2 (two) times daily with a meal.  180 tablet  3   The PMH, PSH, Social History, Family History, Medications, and allergies have been reviewed in [facility], and have been updated if relevant.   Review of Systems See HPI Patient reports no  vision/ hearing changes,anorexia, weight change, fever ,adenopathy, persistant / recurrent hoarseness, swallowing issues, chest pain, edema,persistant / recurrent cough, hemoptysis, dyspnea(rest, exertional, paroxysmal nocturnal), gastrointestinal  bleeding (melena, rectal bleeding), abdominal pain, excessive heart burn, GU symptoms(dysuria, hematuria, pyuria, voiding/incontinence  Issues) syncope, focal weakness, severe memory loss, concerning skin lesions, depression, anxiety, abnormal bruising/bleeding, major joint swelling.       Objective:   Physical Exam BP 122/72  Pulse 80  Temp 97.5 F (36.4 C)  Ht 5' 11.25" (1.81 m)  Wt 184  lb (83.462 kg)  BMI 25.48 kg/m2 Wt Readings from Last 3 Encounters:  04/14/12 184 lb (83.462 kg)  02/10/12 185 lb (83.915 kg)  11/11/11 179 lb 12.8 oz (81.557 kg)     General:  pleasant male in NAD Eyes:  PERRL Ears:  External ear exam shows no significant lesions or deformities.  Otoscopic examination reveals clear canals, tympanic membranes are intact bilaterally without bulging, retraction, inflammation or discharge. Hearing is grossly normal bilaterally. Nose:  External nasal examination shows no deformity or inflammation. Nasal mucosa are pink and moist without lesions or exudates. Mouth:  Oral mucosa and oropharynx without lesions or  exudates.  Teeth in good repair. Neck:  no carotid bruit or thyromegaly no cervical or supraclavicular lymphadenopathy  Lungs:  Normal respiratory effort, chest expands symmetrically. Lungs are clear to auscultation, no crackles or wheezes. Heart:  Normal rate and regular rhythm. S1 and S2 normal without gallop, murmur, click, rub or other extra sounds. Abdomen:  Bowel sounds positive,abdomen soft and non-tender without masses, organomegaly or hernias noted. Genitalia:  Testes bilaterally descended without nodularity, tenderness or masses. No scrotal masses or lesions. No penis lesions or urethral discharge. Prostate:  Prostate gland firm and smooth, 1 plus enlargement, no nodularity, tenderness, mass, asymmetry or induration. Pulses:  R and L posterior tibial pulses are full and equal bilaterally  Extremities:  no edema      Assessment & Plan:   1. Routine general medical examination at a health care facility  Reviewed preventive care protocols, scheduled due services, and updated immunizations Discussed nutrition, exercise, diet, and healthy lifestyle.  Orders Placed This Encounter  Procedures  . Comprehensive metabolic panel  . Lipid Panel  . PSA  . Ambulatory referral to Gastroenterology    Comprehensive metabolic panel  2. BPH (benign prostatic hyperplasia)  Stable on Cardura.   3. Essential thrombocytosis  Stable on current meds. Has appt with Dr. Shirline Frees next month.   4. HTN (hypertension)  Stable on current meds.   5. Screening for colon cancer  Refer to GI for first screening colonoscopy. Ambulatory referral to Gastroenterology  6. Prostate cancer screening  The natural history of prostate cancer and ongoing controversy regarding screening and potential treatment outcomes of prostate cancer has been discussed with the patient. The meaning of a false positive PSA and a false negative PSA has been discussed. He indicates understanding of the limitations of this screening  test and wishes  to proceed with screening PSA testing.  PSA  7. Screening for ischemic heart disease  Lipid Panel

## 2012-04-16 ENCOUNTER — Encounter: Payer: Self-pay | Admitting: *Deleted

## 2012-05-03 ENCOUNTER — Other Ambulatory Visit: Payer: BC Managed Care – PPO | Admitting: Lab

## 2012-05-04 ENCOUNTER — Telehealth: Payer: Self-pay | Admitting: Internal Medicine

## 2012-05-04 ENCOUNTER — Other Ambulatory Visit: Payer: BC Managed Care – PPO | Admitting: Lab

## 2012-05-04 ENCOUNTER — Ambulatory Visit (HOSPITAL_BASED_OUTPATIENT_CLINIC_OR_DEPARTMENT_OTHER): Payer: BC Managed Care – PPO | Admitting: Internal Medicine

## 2012-05-04 ENCOUNTER — Encounter: Payer: Self-pay | Admitting: Internal Medicine

## 2012-05-04 ENCOUNTER — Other Ambulatory Visit (HOSPITAL_BASED_OUTPATIENT_CLINIC_OR_DEPARTMENT_OTHER): Payer: BC Managed Care – PPO | Admitting: Lab

## 2012-05-04 VITALS — BP 115/69 | HR 85 | Temp 97.6°F | Resp 18 | Ht 71.25 in | Wt 188.3 lb

## 2012-05-04 DIAGNOSIS — D473 Essential (hemorrhagic) thrombocythemia: Secondary | ICD-10-CM

## 2012-05-04 LAB — CBC WITH DIFFERENTIAL/PLATELET
BASO%: 1.2 % (ref 0.0–2.0)
Basophils Absolute: 0.1 10*3/uL (ref 0.0–0.1)
EOS%: 5.1 % (ref 0.0–7.0)
HGB: 16.2 g/dL (ref 13.0–17.1)
MCH: 36.9 pg — ABNORMAL HIGH (ref 27.2–33.4)
MCHC: 34.9 g/dL (ref 32.0–36.0)
MCV: 105.9 fL — ABNORMAL HIGH (ref 79.3–98.0)
MONO%: 7.4 % (ref 0.0–14.0)
RBC: 4.38 10*6/uL (ref 4.20–5.82)
RDW: 12.6 % (ref 11.0–14.6)

## 2012-05-04 LAB — COMPREHENSIVE METABOLIC PANEL (CC13)
ALT: 16 U/L (ref 0–55)
Alkaline Phosphatase: 69 U/L (ref 40–150)
Sodium: 141 mEq/L (ref 136–145)
Total Bilirubin: 0.66 mg/dL (ref 0.20–1.20)
Total Protein: 6.3 g/dL — ABNORMAL LOW (ref 6.4–8.3)

## 2012-05-04 NOTE — Progress Notes (Signed)
[facility] [phone]   Fax:([phone]  OFFICE PROGRESS NOTE  [name], MD [address]  DIAGNOSIS: Essential thrombocythemia diagnosed in 2006  PRIOR THERAPY: None  CURRENT THERAPY: Hydroxyurea 500 mg by mouth daily except Friday he is on 1000 mg  INTERVAL HISTORY: Dennis Martin 57 y.o. male returns to the clinic today for routine six-month followup visit. The patient is feeling fine today with no specific complaints. He denied having any significant weight loss or night sweats. He denied having any bleeding issues, bruises or ecchymosis. The patient denied having any significant chest pain, shortness of breath, cough or hemoptysis. He is tolerating his treatment with hydroxyurea fairly well with no significant adverse effects.  MEDICAL HISTORY: Past Medical History  Diagnosis Date  . BPH (benign prostatic hyperplasia)     ALLERGIES:  has No Known Allergies.  MEDICATIONS:  Current Outpatient Prescriptions  Medication Sig Dispense Refill  . aspirin 325 MG tablet Take 325 mg by mouth daily.      Marland Kitchen desonide (DESOWEN) 0.05 % cream Apply topically 2 (two) times daily.  30 g  3  . doxazosin (CARDURA) 1 MG tablet Take 1 tablet (1 mg total) by mouth at bedtime.  90 tablet  3  . hydrocortisone 2.5 % lotion Apply topically daily.      . hydroxyurea (HYDREA) 500 MG capsule Take one by mouth daily and two by mouth on fridays.  90 capsule  3  . ketoconazole (NIZORAL) 2 % shampoo Apply topically 2 (two) times a week.  120 mL  3  . lisinopril (PRINIVIL,ZESTRIL) 10 MG tablet Take 1 tablet (10 mg total) by mouth daily.  90 tablet  3  . loratadine (CLARITIN) 10 MG tablet Take 10 mg by mouth daily.      . Multiple Vitamins-Minerals (CENTRUM SILVER PO) Take by mouth. Take one by mouth daily.      . naproxen (NAPROSYN) 500 MG tablet Take 1 tablet (500 mg total) by mouth 2 (two) times daily with a meal.  180 tablet  3   No current  facility-administered medications for this visit.    SURGICAL HISTORY:  Past Surgical History  Procedure Laterality Date  . Total knee arthroplasty  2001    bilateral    REVIEW OF SYSTEMS:  A comprehensive review of systems was negative.   PHYSICAL EXAMINATION: General appearance: alert, cooperative and no distress Head: Normocephalic, without obvious abnormality, atraumatic Neck: no adenopathy Resp: clear to auscultation bilaterally Cardio: regular rate and rhythm, S1, S2 normal, no murmur, click, rub or gallop GI: soft, non-tender; bowel sounds normal; no masses,  no organomegaly Extremities: extremities normal, atraumatic, no cyanosis or edema  ECOG PERFORMANCE STATUS: 0 - Asymptomatic  Blood pressure 115/69, pulse 85, temperature 97.6 F (36.4 C), temperature source Oral, resp. rate 18, height 5' 11.25" (1.81 m), weight 188 lb 4.8 oz (85.412 kg).  LABORATORY DATA: Lab Results  Component Value Date   WBC 7.0 05/04/2012   HGB 16.2 05/04/2012   HCT 46.3 05/04/2012   MCV 105.9* 05/04/2012   PLT 383 05/04/2012      Chemistry      Component Value Date/Time   NA 137 04/14/2012 0942   K 4.5 04/14/2012 0942   CL 103 04/14/2012 0942   CO2 28 04/14/2012 0942   BUN 31* 04/14/2012 0942   CREATININE 1.0 04/14/2012 0942      Component Value Date/Time   CALCIUM 9.2 04/14/2012 0942  ALKPHOS 57 04/14/2012 0942   AST 21 04/14/2012 0942   ALT 19 04/14/2012 0942   BILITOT 1.0 04/14/2012 0942       RADIOGRAPHIC STUDIES: No results found.  ASSESSMENT: This is a very pleasant 57 years old white male with essential thrombocythemia with positive JAK-2 mutation currently on treatment with hydroxyurea and tolerating it fairly well. His CBC today showed normal platelets count.  PLAN: I discussed the lab result with the patient today. I recommended for him to continue on hydroxyurea with the same regimen. I would see him back for followup visit in 6 months with repeat CBC, comprehensive metabolic  panel and LDH. He was advised to call me immediately if he has any concerning symptoms in the interval.  All questions were answered. The patient knows to call the clinic with any problems, questions or concerns. We can certainly see the patient much sooner if necessary.

## 2012-05-04 NOTE — Telephone Encounter (Signed)
gv and printed appt schedule for pt for Aug °

## 2012-05-04 NOTE — Patient Instructions (Signed)
Platelets count is normal today. Continue treatment with hydroxyurea. Followup in 6 months with repeat CBC

## 2012-05-05 ENCOUNTER — Ambulatory Visit (AMBULATORY_SURGERY_CENTER): Payer: BC Managed Care – PPO | Admitting: *Deleted

## 2012-05-05 VITALS — Ht 71.0 in | Wt 188.0 lb

## 2012-05-05 DIAGNOSIS — Z1211 Encounter for screening for malignant neoplasm of colon: Secondary | ICD-10-CM

## 2012-05-05 MED ORDER — MOVIPREP 100 G PO SOLR: ORAL | Status: DC

## 2012-05-06 ENCOUNTER — Encounter: Payer: Self-pay | Admitting: Internal Medicine

## 2012-05-25 ENCOUNTER — Ambulatory Visit (AMBULATORY_SURGERY_CENTER): Payer: BC Managed Care – PPO | Admitting: Internal Medicine

## 2012-05-25 ENCOUNTER — Encounter: Payer: Self-pay | Admitting: Internal Medicine

## 2012-05-25 VITALS — BP 122/84 | HR 70 | Temp 98.2°F | Resp 21 | Ht 71.0 in | Wt 188.0 lb

## 2012-05-25 DIAGNOSIS — Z1211 Encounter for screening for malignant neoplasm of colon: Secondary | ICD-10-CM

## 2012-05-25 MED ORDER — SODIUM CHLORIDE 0.9 % IV SOLN: 500.0000 mL | INTRAVENOUS | Status: DC

## 2012-05-25 NOTE — Op Note (Signed)
[facility] [address] [place], [address]   COLONOSCOPY PROCEDURE REPORT  PATIENT: Dennis, Martin  MR#: [id number] BIRTHDATE: 08/06/1955 , 56  yrs. old GENDER: Male ENDOSCOPIST: Beverley Fiedler, MD REFERRED AO:ZHYQ, Talia PROCEDURE DATE:  05/25/2012 PROCEDURE:   Colonoscopy, screening ASA CLASS:   Class II INDICATIONS:average risk screening and first colonoscopy. MEDICATIONS: MAC sedation, administered by CRNA and Propofol (Diprivan) 260 mg IV  DESCRIPTION OF PROCEDURE:   After the risks benefits and alternatives of the procedure were thoroughly explained, informed consent was obtained.  A digital rectal exam revealed no rectal mass.   The LB [id number]  endoscope was introduced through the anus and advanced to the cecum, which was identified by both the appendix and ileocecal valve. No adverse events experienced. The quality of the prep was good, using MoviPrep  The instrument was then slowly withdrawn as the colon was fully examined.    COLON FINDINGS: Moderate diverticulosis was noted in the descending colon and sigmoid colon.   The colon mucosa was otherwise normal. Retroflexed views revealed no abnormalities. The time to cecum=2 minutes 13 seconds.  Withdrawal time=8 minutes 12 seconds.  The scope was withdrawn and the procedure completed.  COMPLICATIONS: There were no complications.  ENDOSCOPIC IMPRESSION: 1.   Moderate diverticulosis was noted in the descending colon and sigmoid colon 2.   The colon mucosa was otherwise normal  RECOMMENDATIONS: 1.  High fiber diet 2.  You should continue to follow colorectal cancer screening guidelines for "routine risk" patients with a repeat colonoscopy in 10 years.  There is no need for FOBT (stool) testing for at least 5 years.   eSigned:  Beverley Fiedler, MD 05/25/2012 12:54 PM   cc: The Patient

## 2012-05-25 NOTE — Progress Notes (Signed)
Patient did not experience any of the following events: a burn prior to discharge; a fall within the facility; wrong site/side/patient/procedure/implant event; or a hospital transfer or hospital admission upon discharge from the facility. (G8907) Patient did not have preoperative order for IV antibiotic SSI prophylaxis. (G8918)  

## 2012-05-25 NOTE — Patient Instructions (Addendum)
YOU HAD AN ENDOSCOPIC PROCEDURE TODAY AT THE [facility]: Refer to the procedure report that was given to you for any specific questions about what was found during the examination.  If the procedure report does not answer your questions, please call your gastroenterologist to clarify.  If you requested that your care partner not be given the details of your procedure findings, then the procedure report has been included in a sealed envelope for you to review at your convenience later.  YOU SHOULD EXPECT: Some feelings of bloating in the abdomen. Passage of more gas than usual.  Walking can help get rid of the air that was put into your GI tract during the procedure and reduce the bloating. If you had a lower endoscopy (such as a colonoscopy or flexible sigmoidoscopy) you may notice spotting of blood in your stool or on the toilet paper. If you underwent a bowel prep for your procedure, then you may not have a normal bowel movement for a few days.  DIET: Your first meal following the procedure should be a light meal and then it is ok to progress to your normal diet.  A half-sandwich or bowl of soup is an example of a good first meal.  Heavy or fried foods are harder to digest and may make you feel nauseous or bloated.  Likewise meals heavy in dairy and vegetables can cause extra gas to form and this can also increase the bloating.  Drink plenty of fluids but you should avoid alcoholic beverages for 24 hours.  ACTIVITY: Your care partner should take you home directly after the procedure.  You should plan to take it easy, moving slowly for the rest of the day.  You can resume normal activity the day after the procedure however you should NOT DRIVE or use heavy machinery for 24 hours (because of the sedation medicines used during the test).    SYMPTOMS TO REPORT IMMEDIATELY: A gastroenterologist can be reached at any hour.  During normal business hours, 8:30 AM to 5:00 PM Monday through Friday,  call [phone].  After hours and on weekends, please call the GI answering service at [phone] who will take a message and have the physician on call contact you.   Following lower endoscopy (colonoscopy or flexible sigmoidoscopy):  Excessive amounts of blood in the stool  Significant tenderness or worsening of abdominal pains  Swelling of the abdomen that is new, acute  Fever of 100F or higher   FOLLOW UP: If any biopsies were taken you will be contacted by phone or by letter within the next 1-3 weeks.  Call your gastroenterologist if you have not heard about the biopsies in 3 weeks.  Our staff will call the home number listed on your records the next business day following your procedure to check on you and address any questions or concerns that you may have at that time regarding the information given to you following your procedure. This is a courtesy call and so if there is no answer at the home number and we have not heard from you through the emergency physician on call, we will assume that you have returned to your regular daily activities without incident.   Diverticulosis and high fiber diet information given.   SIGNATURES/CONFIDENTIALITY: You and/or your care partner have signed paperwork which will be entered into your electronic medical record.  These signatures attest to the fact that that the information above on your After Visit Summary has been reviewed  and is understood.  Full responsibility of the confidentiality of this discharge information lies with you and/or your care-partner. 

## 2012-05-26 ENCOUNTER — Telehealth: Payer: Self-pay

## 2012-05-26 NOTE — Telephone Encounter (Signed)
  Follow up Call-  Call back number 05/25/2012  Post procedure Call Back phone  # [phone]  Permission to leave phone message Yes     Patient questions:  Do you have a fever, pain , or abdominal swelling? no Pain Score  0 *  Have you tolerated food without any problems? yes  Have you been able to return to your normal activities? yes  Do you have any questions about your discharge instructions: Diet   no Medications  no Follow up visit  no  Do you have questions or concerns about your Care? no  Actions: * If pain score is 4 or above: No action needed, pain <4.

## 2012-07-26 ENCOUNTER — Other Ambulatory Visit: Payer: Self-pay | Admitting: Family Medicine

## 2012-08-03 ENCOUNTER — Other Ambulatory Visit: Payer: Self-pay | Admitting: *Deleted

## 2012-08-03 ENCOUNTER — Encounter: Payer: Self-pay | Admitting: *Deleted

## 2012-08-03 ENCOUNTER — Other Ambulatory Visit: Payer: Self-pay | Admitting: Family Medicine

## 2012-08-03 ENCOUNTER — Other Ambulatory Visit (INDEPENDENT_AMBULATORY_CARE_PROVIDER_SITE_OTHER): Payer: BC Managed Care – PPO

## 2012-08-03 DIAGNOSIS — I10 Essential (primary) hypertension: Secondary | ICD-10-CM

## 2012-08-03 DIAGNOSIS — D473 Essential (hemorrhagic) thrombocythemia: Secondary | ICD-10-CM

## 2012-08-03 LAB — CBC WITH DIFFERENTIAL/PLATELET
Basophils Absolute: 0.1 10*3/uL (ref 0.0–0.1)
Hemoglobin: 16.2 g/dL (ref 13.0–17.0)
Lymphocytes Relative: 14.3 % (ref 12.0–46.0)
Monocytes Relative: 8.7 % (ref 3.0–12.0)
Neutrophils Relative %: 70.8 % (ref 43.0–77.0)
Platelets: 373 10*3/uL (ref 150.0–400.0)
RDW: 13.5 % (ref 11.5–14.6)

## 2012-08-03 LAB — COMPREHENSIVE METABOLIC PANEL
AST: 23 U/L (ref 0–37)
Albumin: 3.9 g/dL (ref 3.5–5.2)
Alkaline Phosphatase: 58 U/L (ref 39–117)
BUN: 28 mg/dL — ABNORMAL HIGH (ref 6–23)
Calcium: 8.7 mg/dL (ref 8.4–10.5)
Chloride: 101 mEq/L (ref 96–112)
Creatinine, Ser: 1.1 mg/dL (ref 0.4–1.5)
Glucose, Bld: 99 mg/dL (ref 70–99)
Potassium: 4.1 mEq/L (ref 3.5–5.1)

## 2012-08-03 LAB — LACTATE DEHYDROGENASE: LDH: 185 U/L (ref 94–250)

## 2012-08-03 MED ORDER — LISINOPRIL 10 MG PO TABS: ORAL_TABLET | ORAL | Status: DC

## 2012-08-03 MED ORDER — NAPROXEN 500 MG PO TABS: ORAL_TABLET | ORAL | Status: DC

## 2012-08-03 NOTE — Addendum Note (Signed)
Addended by: Alvina Chou on: 08/03/2012 09:55 AM   Modules accepted: Orders

## 2012-09-10 ENCOUNTER — Telehealth: Payer: Self-pay | Admitting: Family Medicine

## 2012-09-10 MED ORDER — DOXAZOSIN MESYLATE 1 MG PO TABS: 1.0000 mg | ORAL_TABLET | Freq: Every day | ORAL | Status: DC

## 2012-09-10 MED ORDER — HYDROXYUREA 500 MG PO CAPS: ORAL_CAPSULE | ORAL | Status: DC

## 2012-09-10 NOTE — Addendum Note (Signed)
Addended by: Eliezer Bottom on: 09/10/2012 08:35 AM   Modules accepted: Orders

## 2012-09-10 NOTE — Telephone Encounter (Signed)
Hi Dr. Dayton Martes, Can you please renew 2 [facility] scripts for:  doxazosin 1MG  tab Qty 90 / 90 days supply hydroxyurea 500MG  cap Qty 104 / 90 days supply  --  Dennis Martin, Dennis Martin DOB 2055/05/29

## 2012-11-02 ENCOUNTER — Telehealth: Payer: Self-pay | Admitting: Internal Medicine

## 2012-11-02 ENCOUNTER — Other Ambulatory Visit (HOSPITAL_BASED_OUTPATIENT_CLINIC_OR_DEPARTMENT_OTHER): Payer: BC Managed Care – PPO | Admitting: Lab

## 2012-11-02 ENCOUNTER — Ambulatory Visit (HOSPITAL_BASED_OUTPATIENT_CLINIC_OR_DEPARTMENT_OTHER): Payer: BC Managed Care – PPO | Admitting: Internal Medicine

## 2012-11-02 ENCOUNTER — Encounter: Payer: Self-pay | Admitting: Internal Medicine

## 2012-11-02 VITALS — BP 112/68 | HR 80 | Temp 97.6°F | Resp 18 | Ht 71.0 in | Wt 194.7 lb

## 2012-11-02 DIAGNOSIS — D473 Essential (hemorrhagic) thrombocythemia: Secondary | ICD-10-CM

## 2012-11-02 LAB — COMPREHENSIVE METABOLIC PANEL (CC13)
AST: 18 U/L (ref 5–34)
Albumin: 3.6 g/dL (ref 3.5–5.0)
BUN: 25.9 mg/dL (ref 7.0–26.0)
Calcium: 8.7 mg/dL (ref 8.4–10.4)
Chloride: 107 mEq/L (ref 98–109)
Glucose: 111 mg/dl (ref 70–140)
Potassium: 4.2 mEq/L (ref 3.5–5.1)
Sodium: 143 mEq/L (ref 136–145)
Total Protein: 5.7 g/dL — ABNORMAL LOW (ref 6.4–8.3)

## 2012-11-02 LAB — CBC WITH DIFFERENTIAL/PLATELET
Basophils Absolute: 0 10*3/uL (ref 0.0–0.1)
Eosinophils Absolute: 0.3 10*3/uL (ref 0.0–0.5)
HGB: 15.1 g/dL (ref 13.0–17.1)
NEUT#: 5.3 10*3/uL (ref 1.5–6.5)
RDW: 12.8 % (ref 11.0–14.6)
WBC: 7.1 10*3/uL (ref 4.0–10.3)
lymph#: 1 10*3/uL (ref 0.9–3.3)

## 2012-11-02 NOTE — Telephone Encounter (Signed)
gv and printed appt sched and avs for pt  °

## 2012-11-02 NOTE — Patient Instructions (Signed)
Continue treatment with hydroxyurea.  Followup visit in 6 months.

## 2012-11-02 NOTE — Progress Notes (Signed)
[facility] Telephone:([phone]   Fax:([phone]) [phone]  OFFICE PROGRESS NOTE  [name], MD [address] [place] [address]  DIAGNOSIS: Essential thrombocythemia diagnosed in 2006   PRIOR THERAPY: None   CURRENT THERAPY: Hydroxyurea 500 mg by mouth daily except Friday he is on 1000 mg  INTERVAL HISTORY: Dennis Martin 57 y.o. male returns to the clinic today for followup visit accompanied by his wife. The patient is feeling fine today with no specific complaints. He denied having any significant weight loss or night sweats. He has no chest pain, shortness breath, cough or hemoptysis. The patient is tolerating his current treatment with hydroxyurea fairly well with no significant adverse effects. He has repeat CBC and comprehensive metabolic panel performed earlier today and he is here for evaluation and discussion of his lab results.  MEDICAL HISTORY: Past Medical History  Diagnosis Date  . BPH (benign prostatic hyperplasia)   . Hypertension   . Essential thrombocytosis     ALLERGIES:  has No Known Allergies.  MEDICATIONS:  Current Outpatient Prescriptions  Medication Sig Dispense Refill  . aspirin 325 MG tablet Take 325 mg by mouth daily.      Marland Kitchen desonide (DESOWEN) 0.05 % cream Apply topically 2 (two) times daily.  30 g  3  . doxazosin (CARDURA) 1 MG tablet Take 1 tablet (1 mg total) by mouth at bedtime.  90 tablet  1  . hydrocortisone 2.5 % lotion Apply topically daily.      . hydroxyurea (HYDREA) 500 MG capsule Take one by mouth daily and two by mouth on fridays.  90 capsule  1  . Inulin (FIBER CHOICE FRUITY BITES) 1.5 G CHEW Chew 1 each by mouth daily.      Marland Kitchen ketoconazole (NIZORAL) 2 % shampoo Apply topically 2 (two) times a week.  120 mL  3  . lisinopril (PRINIVIL,ZESTRIL) 10 MG tablet TAKE 1 TABLET DAILY  90 tablet  1  . loratadine (CLARITIN) 10 MG tablet Take 10 mg by mouth daily.      . naproxen (NAPROSYN) 500 MG tablet TAKE 1 TABLET TWICE A  DAY  WITH MEALS  180 tablet  3  . Pediatric Multivit-Minerals-C (GUMMI BEAR MULTIVITAMIN/MIN) CHEW Chew 1 each by mouth daily.       No current facility-administered medications for this visit.    SURGICAL HISTORY:  Past Surgical History  Procedure Laterality Date  . 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 Ulnar nerve transposition  2007    left    REVIEW OF SYSTEMS:  A comprehensive review of systems was negative.   PHYSICAL EXAMINATION: General appearance: alert, cooperative and no distress Head: Normocephalic, without obvious abnormality, atraumatic Neck: no adenopathy Lymph nodes: Cervical, supraclavicular, and axillary nodes normal. Resp: clear to auscultation bilaterally Cardio: regular rate and rhythm, S1, S2 normal, no murmur, click, rub or gallop GI: soft, non-tender; bowel sounds normal; no masses,  no organomegaly Extremities: extremities normal, atraumatic, no cyanosis or edema  ECOG PERFORMANCE STATUS: 0 - Asymptomatic  Blood pressure 112/68, pulse 80, temperature 97.6 F (36.4 C), temperature source Oral, resp. rate 18, height 5\' 11"  (1.803 m), weight 194 lb 11.2 oz (88.315 kg), SpO2 97.00%.  LABORATORY DATA: Lab Results  Component Value Date   WBC 7.1 11/02/2012   HGB 15.1 11/02/2012   HCT 44.0 11/02/2012   MCV 107.2* 11/02/2012   PLT 362 11/02/2012      Chemistry  Component Value Date/Time   NA 137 08/03/2012 0955   NA 141 05/04/2012 1309   K 4.1 08/03/2012 0955   K 4.1 05/04/2012 1309   CL 101 08/03/2012 0955   CL 102 05/04/2012 1309   CO2 28 08/03/2012 0955   CO2 30* 05/04/2012 1309   BUN 28* 08/03/2012 0955   BUN 28.6* 05/04/2012 1309   CREATININE 1.1 08/03/2012 0955   CREATININE 1.1 05/04/2012 1309      Component Value Date/Time   CALCIUM 8.7 08/03/2012 0955   CALCIUM 9.3 05/04/2012 1309   ALKPHOS 58 08/03/2012 0955   ALKPHOS 69 05/04/2012 1309   AST 23 08/03/2012 0955   AST 24 05/04/2012 1309   ALT 17 08/03/2012 0955   ALT 16 05/04/2012 1309    BILITOT 1.1 08/03/2012 0955   BILITOT 0.66 05/04/2012 1309       RADIOGRAPHIC STUDIES: No results found.  ASSESSMENT AND PLAN: This is a very pleasant 57 years old white male with essential thrombocythemia currently on treatment with hydroxyurea 500 mg by mouth daily except Friday where the patient takes 1000 mg. He is rating his treatment fairly well and there is no significant evidence for disease progression on his recent blood count. I recommended for him to continue on hydroxyurea with the current dose. I would see the patient back for followup visit in 6 months with repeat CBC, comprehensive metabolic panel and LDH. 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  The patient voices understanding of current disease status and treatment options and is in agreement with the current care plan.  All questions were answered. The patient knows to call the clinic with any problems, questions or concerns. We can certainly see the patient much sooner if necessary.

## 2012-12-16 ENCOUNTER — Ambulatory Visit (INDEPENDENT_AMBULATORY_CARE_PROVIDER_SITE_OTHER): Payer: BC Managed Care – PPO

## 2012-12-16 DIAGNOSIS — Z23 Encounter for immunization: Secondary | ICD-10-CM

## 2012-12-30 ENCOUNTER — Encounter: Payer: Self-pay | Admitting: Family Medicine

## 2012-12-31 ENCOUNTER — Encounter: Payer: Self-pay | Admitting: *Deleted

## 2012-12-31 MED ORDER — HYDROXYUREA 500 MG PO CAPS: ORAL_CAPSULE | ORAL | Status: DC

## 2013-01-04 ENCOUNTER — Telehealth: Payer: Self-pay | Admitting: Family Medicine

## 2013-01-04 NOTE — Telephone Encounter (Signed)
Yes ok to schedule on same day.

## 2013-01-04 NOTE — Telephone Encounter (Signed)
Dr. Dayton Martes, Is it ok for [place] Martin to receive the Tdap as well as the shingles vaccine? I will take care of calling him to get the appointment scheduled. This is a request through MyChart.   Appointment Request From: [place] With Provider: [name], MD [-Primary Care Physician-] Preferred Date Range: Any date 01/18/2013 or later  Preferred Times: Monday Morning  Reason: To address the following health maintenance concerns.  Tetanus/Tdap   Comments: If you could give me an appointment at 8:00 AM that would be great. Thanks, [employer]  Appointment Request From: [place] With Provider: [name], MD [-Primary Care Physician-]  Preferred Date Range: Any date 02/22/2013 or later  Preferred Times: Monday Morning Reason for visit: Office Visit  Comments: Hi Dr. Dayton Martes, I need to come in for a shingle vaccination. My Mom suffered with shingle for many years. I have a 1 in 3 chance of getting shingles. Those are not very good odds. Can you tell me if this is a one time shot or an annual thing? If I can get an early appointment at 8 AM that would be great. Lesle Reek will be getting one also. I will set up an appointment for her also. Thanks, [employer]

## 2013-01-15 ENCOUNTER — Other Ambulatory Visit: Payer: Self-pay | Admitting: Family Medicine

## 2013-01-19 ENCOUNTER — Ambulatory Visit: Payer: BC Managed Care – PPO

## 2013-01-19 ENCOUNTER — Encounter: Payer: Self-pay | Admitting: Internal Medicine

## 2013-01-20 ENCOUNTER — Telehealth: Payer: Self-pay

## 2013-01-20 NOTE — Telephone Encounter (Signed)
Pt was in office on 01/19/13 for shingles vaccine; Dr Ermalene Searing said since pt has essential thrombocytosis, pt should ck with hematologist to see if shingles vaccine is OK for pt to take. Pt will ck with hematologist and then notify office. Pt also requested med list updated.advised pt done.

## 2013-01-21 ENCOUNTER — Encounter: Payer: Self-pay | Admitting: Family Medicine

## 2013-01-21 ENCOUNTER — Encounter: Payer: Self-pay | Admitting: Medical Oncology

## 2013-01-21 ENCOUNTER — Telehealth: Payer: Self-pay | Admitting: Medical Oncology

## 2013-01-21 NOTE — Telephone Encounter (Signed)
Pt notified. He wants this in writing for his PCP.

## 2013-01-21 NOTE — Telephone Encounter (Signed)
Message copied by Charma Igo on Thu Jan 21, 2013  9:00 AM ------      Message from: Si Gaul      Created: Wed Jan 20, 2013 11:41 PM       No contrindication from my side if no other issues.      ----- Message -----         From: Charma Igo, RN         Sent: 01/20/2013  12:15 PM           To: Marlan Palau, RN, #            He wants Zostavax and his PC wants your approval to give him vaccination.      Can he get Zostavax?       ------

## 2013-01-21 NOTE — Telephone Encounter (Signed)
E-Mail sent with Dr Asa Lente response.

## 2013-01-22 NOTE — Telephone Encounter (Signed)
Okay to reschedule.

## 2013-01-22 NOTE — Telephone Encounter (Signed)
Pt said Dr Shirline Frees said no contraindication from his side if no other issues. Pt wants to know if can rescheduled shingles vaccine.Please advise.

## 2013-01-22 NOTE — Telephone Encounter (Signed)
Yes reschedule

## 2013-01-22 NOTE — Telephone Encounter (Signed)
Appointment scheduled for 02/09/2013 @ 4:00pm. 

## 2013-02-01 ENCOUNTER — Other Ambulatory Visit (INDEPENDENT_AMBULATORY_CARE_PROVIDER_SITE_OTHER): Payer: BC Managed Care – PPO

## 2013-02-01 DIAGNOSIS — D473 Essential (hemorrhagic) thrombocythemia: Secondary | ICD-10-CM

## 2013-02-01 LAB — COMPREHENSIVE METABOLIC PANEL
AST: 19 U/L (ref 0–37)
Alkaline Phosphatase: 61 U/L (ref 39–117)
BUN: 27 mg/dL — ABNORMAL HIGH (ref 6–23)
CO2: 27 mEq/L (ref 19–32)
Creatinine, Ser: 1 mg/dL (ref 0.4–1.5)
Glucose, Bld: 89 mg/dL (ref 70–99)
Sodium: 138 mEq/L (ref 135–145)
Total Bilirubin: 0.7 mg/dL (ref 0.3–1.2)
Total Protein: 6.1 g/dL (ref 6.0–8.3)

## 2013-02-01 LAB — CBC WITH DIFFERENTIAL/PLATELET
Basophils Relative: 0.4 % (ref 0.0–3.0)
Eosinophils Absolute: 0.3 10*3/uL (ref 0.0–0.7)
Eosinophils Relative: 5 % (ref 0.0–5.0)
HCT: 46.4 % (ref 39.0–52.0)
Hemoglobin: 16 g/dL (ref 13.0–17.0)
Lymphs Abs: 0.9 10*3/uL (ref 0.7–4.0)
MCHC: 34.6 g/dL (ref 30.0–36.0)
MCV: 105.9 fl — ABNORMAL HIGH (ref 78.0–100.0)
Monocytes Absolute: 0.5 10*3/uL (ref 0.1–1.0)
Monocytes Relative: 8.2 % (ref 3.0–12.0)
Neutro Abs: 4 10*3/uL (ref 1.4–7.7)
Neutrophils Relative %: 71.3 % (ref 43.0–77.0)
Platelets: 352 10*3/uL (ref 150.0–400.0)
WBC: 5.7 10*3/uL (ref 4.5–10.5)

## 2013-02-01 NOTE — Addendum Note (Signed)
Addended by: Liane Comber C on: 02/01/2013 12:15 PM   Modules accepted: Orders

## 2013-02-02 LAB — LACTATE DEHYDROGENASE: LDH: 167 U/L (ref 94–250)

## 2013-02-09 ENCOUNTER — Ambulatory Visit (INDEPENDENT_AMBULATORY_CARE_PROVIDER_SITE_OTHER): Payer: BC Managed Care – PPO

## 2013-02-09 ENCOUNTER — Other Ambulatory Visit: Payer: Self-pay | Admitting: Family Medicine

## 2013-02-09 ENCOUNTER — Encounter: Payer: Self-pay | Admitting: Family Medicine

## 2013-02-09 DIAGNOSIS — Z2911 Encounter for prophylactic immunotherapy for respiratory syncytial virus (RSV): Secondary | ICD-10-CM

## 2013-02-09 DIAGNOSIS — Z23 Encounter for immunization: Secondary | ICD-10-CM

## 2013-02-09 MED ORDER — DOXAZOSIN MESYLATE 1 MG PO TABS: ORAL_TABLET | ORAL | Status: DC

## 2013-02-09 NOTE — Telephone Encounter (Signed)
Last office visit 04/14/2012.  Ok to refill?

## 2013-03-02 ENCOUNTER — Telehealth: Payer: Self-pay

## 2013-03-02 NOTE — Telephone Encounter (Signed)
Yes - tdap

## 2013-03-02 NOTE — Telephone Encounter (Signed)
Pt left v/m requesting tetanus shot. Is it OK to schedule nurse visit for td or tdap?

## 2013-03-05 ENCOUNTER — Ambulatory Visit (INDEPENDENT_AMBULATORY_CARE_PROVIDER_SITE_OTHER): Payer: BC Managed Care – PPO

## 2013-03-05 VITALS — BP 135/81 | HR 79 | Resp 18

## 2013-03-05 DIAGNOSIS — L03039 Cellulitis of unspecified toe: Secondary | ICD-10-CM

## 2013-03-05 DIAGNOSIS — L600 Ingrowing nail: Secondary | ICD-10-CM

## 2013-03-05 MED ORDER — CEPHALEXIN 500 MG PO CAPS: 500.0000 mg | ORAL_CAPSULE | Freq: Three times a day (TID) | ORAL | Status: DC

## 2013-03-05 NOTE — Progress Notes (Signed)
° °  Subjective:    Patient ID: [place] Martin, male    DOB: Jun 14, 1955, 57 y.o.   MRN: [id number]  HPI my right big toenail is ingrowned and been going on for about a month and no draining and some redness and sore and tender    Review of Systems  Constitutional: Negative.   HENT: Negative.   Eyes:       Sty  Respiratory: Negative.   Cardiovascular: Negative.   Gastrointestinal: Negative.   Endocrine: Negative.   Genitourinary: Negative.   Musculoskeletal: Negative.   Skin: Negative.   Allergic/Immunologic: Negative.   Neurological: Negative.   Hematological: Negative.   Psychiatric/Behavioral: Negative.        Objective:   Physical Exam Neurovascular status is intact with pedal pulses palpable bilateral epicritic and proprioceptive sensations intact and symmetric bilateral patient previously had AP nail procedure medial lateral border of left great toe more than a year ago with good success no recurrence. At this time has edema and erythema medial border anchor keratosis of medial lateral borders of the right great toenail. Patient is requesting permanent nail excision in similar fashion as previously performed on the left orthopedic biomechanical exam otherwise unremarkable noncontributory. Rectus foot type is noted. Does have keratoses incurvation of nails with early paronychia noted.       Assessment & Plan:  Assessment paronychia secondary to ingrown nail right great toe. Plan at this time permanent nail excision and phenol matricectomy of borders right great toe. Local anesthetic in Mr. to of 3 cc 50-50 mixture of 2% Xylocaine plain and 0.5% Marcaine plain. Betadine prep was performed. The medial lateral borders are then excised sharply in a longitudinal fashion feel matricectomy followed by alcohol wash Silvadene cream and gauze dressing being applied. Patient is given instructions for daily soaks and Betadine warm water prescription for cephalexin 500 mg 3 times a day x10 days.  Recommended Tylenol as needed for pain. Reappointed to 3 weeks for followup for nail check. Contact us if any changes or exacerbations occur.  Alvan Dame DPM

## 2013-03-05 NOTE — Patient Instructions (Addendum)

## 2013-03-05 NOTE — Telephone Encounter (Signed)
Spoke with pt and scheduled nurse visit for 04/07/13 at 2:30 pm for tdap.; pt needs late appt and pts wife has nurse visit same day at 3:30 pm and pt will come at 3:30.pm.

## 2013-03-22 ENCOUNTER — Encounter: Payer: Self-pay | Admitting: Family Medicine

## 2013-03-22 ENCOUNTER — Ambulatory Visit (INDEPENDENT_AMBULATORY_CARE_PROVIDER_SITE_OTHER): Payer: BC Managed Care – PPO | Admitting: Family Medicine

## 2013-03-22 DIAGNOSIS — L270 Generalized skin eruption due to drugs and medicaments taken internally: Secondary | ICD-10-CM

## 2013-03-22 DIAGNOSIS — I10 Essential (primary) hypertension: Secondary | ICD-10-CM

## 2013-03-22 NOTE — Patient Instructions (Signed)
I think you have a drug rash from Keflex (cephalexin)- continue your claritin  Do not take that antibiotic again  If worse or itching let me know  Try to stay cool

## 2013-03-22 NOTE — Progress Notes (Signed)
Subjective:    Patient ID: [place] Martin, male    DOB: 02-Jan-1956, 57 y.o.   MRN: [id number]  HPI Here for a rash  Started 2 days ago - red bumps on upper body- trunk (spares head)- some on face  None on palms  As of wed was on cephalexin - finished a 10 day course  No other changes  Not too itchy- but he does rub it   noST or cold symptoms   ? A delayed sens rxn  Had a shingles shot 1-2 mo ago   No new products  No fever   Patient Active Problem List   Diagnosis Date Noted  . Allergic drug rash 03/22/2013  . Routine general medical examination at a health care facility 04/14/2012  . Essential thrombocytosis 08/12/2011  . HTN (hypertension) 08/12/2011  . BPH (benign prostatic hyperplasia) 08/12/2011   Past Medical History  Diagnosis Date  . BPH (benign prostatic hyperplasia)   . Hypertension   . Essential thrombocytosis    Past Surgical History  Procedure Laterality Date  . 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 Ulnar nerve transposition  2007    left   History  Substance Use Topics  . Smoking status: Former Smoker    Quit date: 05/06/2003  . Smokeless tobacco: Never Used     Comment: Quit 2005  . Alcohol Use: 1.2 oz/week    2 Cans of beer per week     Comment: occ   Family History  Problem Relation Age of Onset  . Stomach cancer Neg Hx   . Colon cancer Paternal Aunt 40   Allergies  Allergen Reactions  . Keflex [Cephalexin]     rash   Current Outpatient Prescriptions on File Prior to Visit  Medication Sig Dispense Refill  . aspirin 325 MG tablet Take 325 mg by mouth daily.      Marland Kitchen desonide (DESOWEN) 0.05 % cream Apply topically 2 (two) times daily.  30 g  3  . doxazosin (CARDURA) 1 MG tablet TAKE 1 TABLET AT BEDTIME  90 tablet  3  . FIBER SELECT GUMMIES PO Pt chews 2 Phillips fiber good gummies daily.      . folic acid (FOLVITE) 400 MCG tablet Take 400 mcg by mouth daily.      . hydrocortisone 2.5 % lotion Apply topically daily.      .  hydroxyurea (HYDREA) 500 MG capsule Take one by mouth daily and two by mouth on fridays.  90 capsule  1  . ketoconazole (NIZORAL) 2 % shampoo Apply topically 2 (two) times a week.  120 mL  3  . lisinopril (PRINIVIL,ZESTRIL) 10 MG tablet TAKE 1 TABLET DAILY  90 tablet  1  . loratadine (CLARITIN) 10 MG tablet Take 10 mg by mouth daily.      . naproxen (NAPROSYN) 500 MG tablet TAKE 1 TABLET TWICE A DAY  WITH MEALS  180 tablet  3  . OVER THE COUNTER MEDICATION Vitafusion men's complete vitamin gummies; pt chews two gummies daily.       No current facility-administered medications on file prior to visit.    Review of Systems    Review of Systems  Constitutional: Negative for fever, appetite change, fatigue and unexpected weight change.  Eyes: Negative for pain and visual disturbance. neg for eye swelling  ENT neg lip or throat swelling Respiratory: Negative for cough and shortness of breath.   Cardiovascular: Negative for cp or palpitations  Gastrointestinal: Negative for nausea, diarrhea and constipation.  Genitourinary: Negative for urgency and frequency.  Skin: Negative for pallor or rash   Neurological: Negative for weakness, light-headedness, numbness and headaches.  Hematological: Negative for adenopathy. Does not bruise/bleed easily.  Psychiatric/Behavioral: Negative for dysphoric mood. The patient is not nervous/anxious.      Objective:   Physical Exam  Constitutional: He appears well-developed and well-nourished. No distress.  HENT:  Head: Normocephalic and atraumatic.  Mouth/Throat: Oropharynx is clear and moist.  No mouth or lip swelling   Eyes: Conjunctivae and EOM are normal. Pupils are equal, round, and reactive to light. Right eye exhibits no discharge. Left eye exhibits no discharge.  Neck: Normal range of motion. Neck supple.  Cardiovascular: Normal rate and regular rhythm.   Pulmonary/Chest: Effort normal and breath sounds normal. No respiratory distress. He has no  wheezes. He has no rales.  Lymphadenopathy:    He has no cervical adenopathy.  Neurological: He is alert. He has normal reflexes.  Skin: Skin is warm and dry. Rash noted. There is erythema.  Rash over trunk/ neck/ lower face and upper arms- papular/ dry and erythematous No excoriation  No plaques   Psychiatric: He has a normal mood and affect.          Assessment & Plan:

## 2013-03-22 NOTE — Assessment & Plan Note (Signed)
Likely from keflex Disc symptomatic care -antihistamine prn Added to allergy list  Update if not starting to improve in a week or if worsening  - esp if any sob or swelling

## 2013-03-22 NOTE — Progress Notes (Signed)
Pre-visit discussion using our clinic review tool. No additional management support is needed unless otherwise documented below in the visit note.  

## 2013-03-24 ENCOUNTER — Ambulatory Visit (INDEPENDENT_AMBULATORY_CARE_PROVIDER_SITE_OTHER): Payer: BC Managed Care – PPO

## 2013-03-24 VITALS — BP 116/77 | HR 76 | Resp 16 | Ht 71.0 in | Wt 190.0 lb

## 2013-03-24 DIAGNOSIS — L600 Ingrowing nail: Secondary | ICD-10-CM

## 2013-03-24 NOTE — Progress Notes (Signed)
   Subjective:    Patient ID: [place] Martin, male    DOB: August 27, 1955, 57 y.o.   MRN: [id number]  HPI  "It's doing great.  The only thing is I had an allergic reaction to the Cephalexin.  It put a rash all over my body."      Review of Systems no changes     Objective:   Physical Exam Neurovascular status is intact pedal pulses palpable. No complaints patient did develop a rash in his axillary area after taking the cephalexin likely allergic to cephalexin or penicillins will avoid those in the future. Are not causing any pain or discomfort or pruritus. The nail right hallux appears to be doing well following AP nail procedure no discharge or drainage no signs of recurrence or regrowthtoe noted       Assessment & Plan:  Assessment good postop progress following nail procedure discharge to an as-needed basis for future followup  Alvan Dame DPM

## 2013-03-24 NOTE — Patient Instructions (Signed)

## 2013-04-07 ENCOUNTER — Ambulatory Visit (INDEPENDENT_AMBULATORY_CARE_PROVIDER_SITE_OTHER): Payer: BC Managed Care – PPO

## 2013-04-07 DIAGNOSIS — Z23 Encounter for immunization: Secondary | ICD-10-CM

## 2013-04-22 ENCOUNTER — Ambulatory Visit (INDEPENDENT_AMBULATORY_CARE_PROVIDER_SITE_OTHER): Payer: BC Managed Care – PPO | Admitting: Family Medicine

## 2013-04-22 ENCOUNTER — Telehealth: Payer: Self-pay

## 2013-04-22 ENCOUNTER — Encounter: Payer: Self-pay | Admitting: Family Medicine

## 2013-04-22 VITALS — BP 108/62 | HR 91 | Temp 97.7°F | Wt 191.0 lb

## 2013-04-22 DIAGNOSIS — R21 Rash and other nonspecific skin eruption: Secondary | ICD-10-CM | POA: Insufficient documentation

## 2013-04-22 NOTE — Telephone Encounter (Signed)
Pt received Zostavax on 02/09/13.

## 2013-04-22 NOTE — Patient Instructions (Signed)
Go to the lab on the way out.  We'll contact you with your lab report. We'll go from there. Take care. Don't change your meds in the meantime.

## 2013-04-22 NOTE — Assessment & Plan Note (Signed)
On hydroxyurea, would check cmet and cbc. This isn't a new med.  He agrees with plan. Nontoxic.  If unrevealing labs, then consider punch bx vs referral to derm.  He agrees.  No change in meds now.

## 2013-04-22 NOTE — Progress Notes (Signed)
Pre-visit discussion using our clinic review tool. No additional management support is needed unless otherwise documented below in the visit note.  Finished keflex course, then had rash.  Neck down, spared the palms and soles. Progressive in the last month, same distribution but more pronounced. No new soaps, etc.  No fevers. Not itchy, no blistering.  No similar sx prev.  No airway or oral sx.    Meds, vitals, and allergies reviewed.   ROS: See HPI.  Otherwise, noncontributory.  nad ncat Mmm, OP wnl Neck supple, no LA rrr ctab abd soft Ext w/o edema Skin with blanching diffuse maculopapular rash in nondermatomal distribution B from neck down, chest >back, spares palms and soles

## 2013-04-23 ENCOUNTER — Other Ambulatory Visit: Payer: Self-pay | Admitting: Family Medicine

## 2013-04-23 DIAGNOSIS — R21 Rash and other nonspecific skin eruption: Secondary | ICD-10-CM

## 2013-04-23 LAB — CBC WITH DIFFERENTIAL/PLATELET
BASOS PCT: 0.6 % (ref 0.0–3.0)
Basophils Absolute: 0 10*3/uL (ref 0.0–0.1)
Eosinophils Absolute: 0.3 10*3/uL (ref 0.0–0.7)
Eosinophils Relative: 3.2 % (ref 0.0–5.0)
HCT: 49.3 % (ref 39.0–52.0)
Hemoglobin: 16.3 g/dL (ref 13.0–17.0)
Lymphocytes Relative: 14 % (ref 12.0–46.0)
Lymphs Abs: 1.2 10*3/uL (ref 0.7–4.0)
MCHC: 33 g/dL (ref 30.0–36.0)
MONO ABS: 0.7 10*3/uL (ref 0.1–1.0)
Monocytes Relative: 8.8 % (ref 3.0–12.0)
NEUTROS ABS: 6.1 10*3/uL (ref 1.4–7.7)
Neutrophils Relative %: 73.4 % (ref 43.0–77.0)
PLATELETS: 476 10*3/uL — AB (ref 150.0–400.0)
RBC: 4.46 Mil/uL (ref 4.22–5.81)
RDW: 13.2 % (ref 11.5–14.6)
WBC: 8.3 10*3/uL (ref 4.5–10.5)

## 2013-04-23 LAB — COMPREHENSIVE METABOLIC PANEL
ALK PHOS: 66 U/L (ref 39–117)
ALT: 20 U/L (ref 0–53)
AST: 23 U/L (ref 0–37)
Albumin: 4.3 g/dL (ref 3.5–5.2)
BILIRUBIN TOTAL: 0.9 mg/dL (ref 0.3–1.2)
BUN: 30 mg/dL — ABNORMAL HIGH (ref 6–23)
CO2: 30 mEq/L (ref 19–32)
CREATININE: 1 mg/dL (ref 0.4–1.5)
Calcium: 9.5 mg/dL (ref 8.4–10.5)
Chloride: 102 mEq/L (ref 96–112)
GFR: 87.23 mL/min (ref >60.00)
GLUCOSE: 76 mg/dL (ref 70–99)
Potassium: 4.7 mEq/L (ref 3.5–5.1)
SODIUM: 138 meq/L (ref 135–145)
TOTAL PROTEIN: 6.5 g/dL (ref 6.0–8.3)

## 2013-04-30 ENCOUNTER — Other Ambulatory Visit: Payer: Self-pay | Admitting: Medical Oncology

## 2013-04-30 DIAGNOSIS — D473 Essential (hemorrhagic) thrombocythemia: Secondary | ICD-10-CM

## 2013-05-03 ENCOUNTER — Other Ambulatory Visit (HOSPITAL_BASED_OUTPATIENT_CLINIC_OR_DEPARTMENT_OTHER): Payer: BC Managed Care – PPO

## 2013-05-03 ENCOUNTER — Ambulatory Visit (HOSPITAL_BASED_OUTPATIENT_CLINIC_OR_DEPARTMENT_OTHER): Payer: BC Managed Care – PPO | Admitting: Internal Medicine

## 2013-05-03 ENCOUNTER — Encounter: Payer: Self-pay | Admitting: Internal Medicine

## 2013-05-03 VITALS — BP 140/73 | HR 85 | Temp 96.9°F | Resp 18 | Ht 71.0 in | Wt 191.3 lb

## 2013-05-03 DIAGNOSIS — D473 Essential (hemorrhagic) thrombocythemia: Secondary | ICD-10-CM

## 2013-05-03 LAB — LACTATE DEHYDROGENASE (CC13): LDH: 230 U/L (ref 125–245)

## 2013-05-03 LAB — CBC WITH DIFFERENTIAL/PLATELET
BASO%: 0.2 % (ref 0.0–2.0)
Basophils Absolute: 0 10*3/uL (ref 0.0–0.1)
EOS%: 0.1 % (ref 0.0–7.0)
Eosinophils Absolute: 0 10*3/uL (ref 0.0–0.5)
HCT: 45.7 % (ref 38.4–49.9)
HGB: 15.8 g/dL (ref 13.0–17.1)
LYMPH%: 4.3 % — AB (ref 14.0–49.0)
MCH: 36.5 pg — AB (ref 27.2–33.4)
MCHC: 34.6 g/dL (ref 32.0–36.0)
MCV: 105.5 fL — ABNORMAL HIGH (ref 79.3–98.0)
MONO#: 0.5 10*3/uL (ref 0.1–0.9)
MONO%: 5.1 % (ref 0.0–14.0)
NEUT#: 9.1 10*3/uL — ABNORMAL HIGH (ref 1.5–6.5)
NEUT%: 90.3 % — AB (ref 39.0–75.0)
PLATELETS: 474 10*3/uL — AB (ref 140–400)
RBC: 4.33 10*6/uL (ref 4.20–5.82)
RDW: 12.7 % (ref 11.0–14.6)
WBC: 10.1 10*3/uL (ref 4.0–10.3)
lymph#: 0.4 10*3/uL — ABNORMAL LOW (ref 0.9–3.3)

## 2013-05-03 LAB — COMPREHENSIVE METABOLIC PANEL (CC13)
ALK PHOS: 70 U/L (ref 40–150)
ALT: 20 U/L (ref 0–55)
ANION GAP: 9 meq/L (ref 3–11)
AST: 18 U/L (ref 5–34)
Albumin: 4.2 g/dL (ref 3.5–5.0)
BILIRUBIN TOTAL: 0.61 mg/dL (ref 0.20–1.20)
BUN: 27.7 mg/dL — ABNORMAL HIGH (ref 7.0–26.0)
CO2: 26 mEq/L (ref 22–29)
CREATININE: 0.9 mg/dL (ref 0.7–1.3)
Calcium: 9.4 mg/dL (ref 8.4–10.4)
Chloride: 103 mEq/L (ref 98–109)
Glucose: 122 mg/dl (ref 70–140)
Potassium: 4.3 mEq/L (ref 3.5–5.1)
SODIUM: 138 meq/L (ref 136–145)
TOTAL PROTEIN: 6.1 g/dL — AB (ref 6.4–8.3)

## 2013-05-03 NOTE — Progress Notes (Signed)
House Telephone:([phone]) [phone]   Fax:([phone]) [name], MD [place] [address]  DIAGNOSIS: Essential thrombocythemia diagnosed in 2006   PRIOR THERAPY: None   CURRENT THERAPY: Hydroxyurea 500 mg by mouth daily except Friday he is on 1000 mg  INTERVAL HISTORY: Dennis Martin 58 y.o. male returns to the clinic today for followup visit. The patient is feeling fine today with no specific complaints. He denied having any significant weight loss or night sweats. He was recently treated with a course of Keflex for skin infection but he developed allergy to Keflex. He is currently on prednisone. He has no chest pain, shortness of breath, cough or hemoptysis. The patient is tolerating his current treatment with hydroxyurea fairly well with no significant adverse effects. He has repeat CBC and comprehensive metabolic panel performed earlier today and he is here for evaluation and discussion of his lab results.  MEDICAL HISTORY: Past Medical History  Diagnosis Date  . BPH (benign prostatic hyperplasia)   . Hypertension   . Essential thrombocytosis     ALLERGIES:  is allergic to keflex.  MEDICATIONS:  Current Outpatient Prescriptions  Medication Sig Dispense Refill  . aspirin 325 MG tablet Take 325 mg by mouth daily.      Marland Kitchen desonide (DESOWEN) 0.05 % cream Apply topically 2 (two) times daily.  30 g  3  . doxazosin (CARDURA) 1 MG tablet TAKE 1 TABLET AT BEDTIME  90 tablet  3  . FIBER SELECT GUMMIES PO Pt chews 2 Phillips fiber good gummies daily.      . folic acid (FOLVITE) 500 MCG tablet Take 400 mcg by mouth daily.      . hydrocortisone 2.5 % lotion Apply topically daily.      . hydroxyurea (HYDREA) 500 MG capsule Take one by mouth daily and two by mouth on fridays.  90 capsule  1  . ketoconazole (NIZORAL) 2 % shampoo Apply topically 2 (two) times a week.  120 mL  3  . lisinopril (PRINIVIL,ZESTRIL) 10 MG tablet TAKE  1 TABLET DAILY  90 tablet  1  . loratadine (CLARITIN) 10 MG tablet Take 10 mg by mouth daily.      . naproxen (NAPROSYN) 500 MG tablet TAKE 1 TABLET TWICE A DAY  WITH MEALS  180 tablet  3  . OVER THE COUNTER MEDICATION Vitafusion men's complete vitamin gummies; pt chews two gummies daily.      Marland Kitchen PREDNISONE, PAK, PO Take by mouth. Tapering dose for rash      . triamcinolone cream (KENALOG) 0.1 %        No current facility-administered medications for this visit.    SURGICAL HISTORY:  Past Surgical History  Procedure Laterality Date  . 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 Ulnar nerve transposition  2007    left    REVIEW OF SYSTEMS:  A comprehensive review of systems was negative.   PHYSICAL EXAMINATION: General appearance: alert, cooperative and no distress Head: Normocephalic, without obvious abnormality, atraumatic Neck: no adenopathy Lymph nodes: Cervical, supraclavicular, and axillary nodes normal. Resp: clear to auscultation bilaterally Cardio: regular rate and rhythm, S1, S2 normal, no murmur, click, rub or gallop GI: soft, non-tender; bowel sounds normal; no masses,  no organomegaly Extremities: extremities normal, atraumatic, no cyanosis or edema  ECOG PERFORMANCE STATUS: 0 - Asymptomatic  Blood pressure 140/73, pulse 85, temperature 96.9 F (36.1 C), temperature  source Oral, resp. rate 18, height 5\' 11"  (1.803 m), weight 191 lb 4.8 oz (86.773 kg), SpO2 97.00%.  LABORATORY DATA: Lab Results  Component Value Date   WBC 10.1 05/03/2013   HGB 15.8 05/03/2013   HCT 45.7 05/03/2013   MCV 105.5* 05/03/2013   PLT 474* 05/03/2013      Chemistry      Component Value Date/Time   NA 138 04/22/2013 1637   NA 143 11/02/2012 1321   K 4.7 04/22/2013 1637   K 4.2 11/02/2012 1321   CL 102 04/22/2013 1637   CL 102 05/04/2012 1309   CO2 30 04/22/2013 1637   CO2 26 11/02/2012 1321   BUN 30* 04/22/2013 1637   BUN 25.9 11/02/2012 1321   CREATININE 1.0 04/22/2013 1637   CREATININE 1.0  11/02/2012 1321      Component Value Date/Time   CALCIUM 9.5 04/22/2013 1637   CALCIUM 8.7 11/02/2012 1321   ALKPHOS 66 04/22/2013 1637   ALKPHOS 61 11/02/2012 1321   AST 23 04/22/2013 1637   AST 18 11/02/2012 1321   ALT 20 04/22/2013 1637   ALT 16 11/02/2012 1321   BILITOT 0.9 04/22/2013 1637   BILITOT 0.49 11/02/2012 1321       RADIOGRAPHIC STUDIES: No results found.  ASSESSMENT AND PLAN: This is a very pleasant 58 years old white male with essential thrombocythemia currently on treatment with hydroxyurea 500 mg by mouth daily except Friday where the patient takes 1000 mg. He is tolerating his treatment fairly well and there is no significant evidence for disease progression on his recent blood count. I recommended for him to continue on hydroxyurea with the current dose. I would see the patient back for followup visit in 6 months with repeat CBC, comprehensive metabolic panel and LDH. He continue to take folic acid over-the-counter 1-2 tablets every day for the macrocytosis resulting from the hydroxyurea treatment.  The patient voices understanding of current disease status and treatment options and is in agreement with the current care plan.  All questions were answered. The patient knows to call the clinic with any problems, questions or concerns. We can certainly see the patient much sooner if necessary.

## 2013-05-04 ENCOUNTER — Telehealth: Payer: Self-pay | Admitting: Internal Medicine

## 2013-05-04 NOTE — Telephone Encounter (Signed)
s.w. pt and advised on Aug appt....pt ok and aware °

## 2013-05-23 ENCOUNTER — Other Ambulatory Visit: Payer: Self-pay | Admitting: Family Medicine

## 2013-06-17 ENCOUNTER — Encounter: Payer: Self-pay | Admitting: Family Medicine

## 2013-06-17 ENCOUNTER — Ambulatory Visit (INDEPENDENT_AMBULATORY_CARE_PROVIDER_SITE_OTHER): Payer: BC Managed Care – PPO | Admitting: Family Medicine

## 2013-06-17 ENCOUNTER — Telehealth: Payer: Self-pay | Admitting: Family Medicine

## 2013-06-17 VITALS — BP 128/70 | HR 80 | Temp 98.0°F | Ht 70.5 in | Wt 190.8 lb

## 2013-06-17 DIAGNOSIS — I10 Essential (primary) hypertension: Secondary | ICD-10-CM

## 2013-06-17 DIAGNOSIS — Z125 Encounter for screening for malignant neoplasm of prostate: Secondary | ICD-10-CM

## 2013-06-17 DIAGNOSIS — Z136 Encounter for screening for cardiovascular disorders: Secondary | ICD-10-CM

## 2013-06-17 DIAGNOSIS — Z0000 Encounter for general adult medical examination without abnormal findings: Secondary | ICD-10-CM

## 2013-06-17 DIAGNOSIS — Z1231 Encounter for screening mammogram for malignant neoplasm of breast: Secondary | ICD-10-CM

## 2013-06-17 DIAGNOSIS — Z780 Asymptomatic menopausal state: Secondary | ICD-10-CM

## 2013-06-17 DIAGNOSIS — R21 Rash and other nonspecific skin eruption: Secondary | ICD-10-CM

## 2013-06-17 DIAGNOSIS — N400 Enlarged prostate without lower urinary tract symptoms: Secondary | ICD-10-CM

## 2013-06-17 DIAGNOSIS — K625 Hemorrhage of anus and rectum: Secondary | ICD-10-CM

## 2013-06-17 LAB — PSA: PSA: 0.43 ng/mL (ref 0.10–4.00)

## 2013-06-17 LAB — LIPID PANEL
Cholesterol: 171 mg/dL (ref 0–200)
HDL: 45.6 mg/dL (ref >39.00)
LDL Cholesterol: 93 mg/dL (ref 0–99)
Total CHOL/HDL Ratio: 4
Triglycerides: 162 mg/dL — ABNORMAL HIGH (ref 0.0–149.0)
VLDL: 32.4 mg/dL (ref 0.0–40.0)

## 2013-06-17 MED ORDER — HYDROCORTISONE ACE-PRAMOXINE 1-1 % RE FOAM: 1.0000 | Freq: Two times a day (BID) | RECTAL | Status: DC

## 2013-06-17 NOTE — Progress Notes (Signed)
Subjective:    Patient ID: Dennis Martin, male    DOB: 06-17-1955, 58 y.o.   MRN: [id number]  HPI  Pleasant 58 yo male here for CPX.  He would also like to discuss his rash today.  Saw Dr. Glori Bickers and Dr. Damita Dunnings for this.  Started after given keflex by podiatrist after toenail procedure. Referred to derm- told it was a drug reaction and placed on triamcinolone and nizoral on areas near buttocks. Symptoms have almost completely resolved.  Colonoscopy 05/25/12 (py[facility]le)- recall in 10 years. Has had some painless bright red blood per rectum for past several weeks.  On toilet paper, not in stool No black stools.   Essential Thrombocytosis- diagnosed in 2006. Takes hydroxyurea. CBC has been stable. Followed by hematology (Dr. Earlie Server)- his platelets have been in 300-400s.  Last saw him on 05/03/13- note reviewed.  Suggested follow up in 6 months. Lab Results  Component Value Date   WBC 10.1 05/03/2013   HGB 15.8 05/03/2013   HCT 45.7 05/03/2013   MCV 105.5* 05/03/2013   PLT 474* 05/03/2013     HTN- on lisinopril 10 mg daily. Denies any HA, blurred vision, dizziness with standing, LE edema, CP or SOB. Lab Results  Component Value Date   CREATININE 0.9 05/03/2013    BPH- has been on cardura 1 mg for years.   Emptying bladder more effectively than he was prior to using it. Lab Results  Component Value Date   PSA 0.48 04/14/2012    Lab Results  Component Value Date   CHOL 155 04/14/2012   HDL 46.70 04/14/2012   LDLCALC 96 04/14/2012   TRIG 62.0 04/14/2012   CHOLHDL 3 04/14/2012    He has no complaints.  No family h/o prostate CA.  Patient Active Problem List   Diagnosis Date Noted  . Rash and nonspecific skin eruption 04/22/2013  . Allergic drug rash 03/22/2013  . Routine general medical examination at a health care facility 04/14/2012  . Essential thrombocytosis 08/12/2011  . HTN (hypertension) 08/12/2011  . BPH (benign prostatic hyperplasia) 08/12/2011   Past Medical History  Diagnosis  Date  . BPH (benign prostatic hyperplasia)   . Hypertension   . Essential thrombocytosis    Past Surgical History  Procedure Laterality Date  . 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 Ulnar nerve transposition  2007    left   History  Substance Use Topics  . Smoking status: Former Smoker    Quit date: 05/06/2003  . Smokeless tobacco: Never Used     Comment: Quit 2005  . Alcohol Use: 1.2 oz/week    2 Cans of beer per week     Comment: occ   Family History  Problem Relation Age of Onset  . Stomach cancer Neg Hx   . Colon cancer Paternal Aunt 40   Allergies  Allergen Reactions  . Keflex [Cephalexin]     rash   Current Outpatient Prescriptions on File Prior to Visit  Medication Sig Dispense Refill  . aspirin 325 MG tablet Take 325 mg by mouth daily.      Marland Kitchen desonide (DESOWEN) 0.05 % cream Apply topically 2 (two) times daily.  30 g  3  . doxazosin (CARDURA) 1 MG tablet TAKE 1 TABLET AT BEDTIME  90 tablet  3  . FIBER SELECT GUMMIES PO Pt chews 2 Phillips fiber good gummies daily.      . folic acid (FOLVITE) 106 MCG tablet Take 400 mcg by mouth daily.      Marland Kitchen  hydrocortisone 2.5 % lotion Apply topically daily.      . hydroxyurea (HYDREA) 500 MG capsule TAKE 1 CAPSULE DAILY EXCEPTTAKE 2 CAPSULES ON FRIDAYS  90 capsule  0  . ketoconazole (NIZORAL) 2 % shampoo Apply topically 2 (two) times a week.  120 mL  3  . lisinopril (PRINIVIL,ZESTRIL) 10 MG tablet TAKE 1 TABLET DAILY  90 tablet  1  . loratadine (CLARITIN) 10 MG tablet Take 10 mg by mouth daily.      . naproxen (NAPROSYN) 500 MG tablet TAKE 1 TABLET TWICE A DAY  WITH MEALS  180 tablet  3  . OVER THE COUNTER MEDICATION Vitafusion men's complete vitamin gummies; pt chews two gummies daily.      Marland Kitchen triamcinolone cream (KENALOG) 0.1 %        No current facility-administered medications on file prior to visit.   The PMH, PSH, Social History, Family History, Medications, and allergies have been reviewed in [facility], and have been  updated if relevant.   Review of Systems See HPI Patient reports no  vision/ hearing changes,anorexia, weight change, fever ,adenopathy, persistant / recurrent hoarseness, swallowing issues, chest pain, edema,persistant / recurrent cough, hemoptysis, dyspnea(rest, exertional, paroxysmal nocturnal), gastrointestinal  bleeding (melena, rectal bleeding), abdominal pain, excessive heart burn, GU symptoms(dysuria, hematuria, pyuria, voiding/incontinence  Issues) syncope, focal weakness, severe memory loss, concerning skin lesions, depression, anxiety, abnormal bruising/bleeding, major joint swelling.       Objective:   Physical Exam BP 128/70  Pulse 80  Temp(Src) 98 F (36.7 C) (Oral)  Ht 5' 10.5" (1.791 m)  Wt 190 lb 12 oz (86.524 kg)  BMI 26.97 kg/m2  SpO2 97% Wt Readings from Last 3 Encounters:  06/17/13 190 lb 12 oz (86.524 kg)  05/03/13 191 lb 4.8 oz (86.773 kg)  04/22/13 191 lb (86.637 kg)     General:  pleasant male in NAD Eyes:  PERRL Ears:  External ear exam shows no significant lesions or deformities.  Otoscopic examination reveals clear canals, tympanic membranes are intact bilaterally without bulging, retraction, inflammation or discharge. Hearing is grossly normal bilaterally. Nose:  External nasal examination shows no deformity or inflammation. Nasal mucosa are pink and moist without lesions or exudates. Mouth:  Oral mucosa and oropharynx without lesions or exudates.  Teeth in good repair. Neck:  no carotid bruit or thyromegaly no cervical or supraclavicular lymphadenopathy  Lungs:  Normal respiratory effort, chest expands symmetrically. Lungs are clear to auscultation, no crackles or wheezes. Heart:  Normal rate and regular rhythm. S1 and S2 normal without gallop, murmur, click, rub or other extra sounds. Abdomen:  Bowel sounds positive,abdomen soft and non-tender without masses, organomegaly or hernias noted. Genitalia:  Testes bilaterally descended without nodularity,  tenderness or masses. No scrotal masses or lesions. No penis lesions or urethral discharge. Prostate:  Prostate gland firm and smooth, 1 plus enlargement, no nodularity, tenderness, mass, asymmetry or induration. Rectal:  No fissures, small internal hemorrhoid, no external hemorrhoids on exam Pulses:  R and L posterior tibial pulses are full and equal bilaterally  Extremities:  no edema      Assessment & Plan:

## 2013-06-17 NOTE — Assessment & Plan Note (Signed)
Resolved. Drug rxn. Keflex added to allergy list.

## 2013-06-17 NOTE — Assessment & Plan Note (Signed)
Reviewed preventive care protocols, scheduled due services, and updated immunizations Discussed nutrition, exercise, diet, and healthy lifestyle.  Orders Placed This Encounter  Procedures  . PSA  . Lipid panel

## 2013-06-17 NOTE — Patient Instructions (Signed)
Good to see you. Try proctofoam- twice daily for 7-10 days. Call me with an update.  We will call you with your lab results or you can view them online.

## 2013-06-17 NOTE — Assessment & Plan Note (Signed)
Reassured by recent normal colonoscopy. ?hemorrhoidal bleeding but was not seen on colonoscopy. Will treat with short course of proctofoam.  If no improvement, will send to GI. Call or return to clinic prn if these symptoms worsen or fail to improve as anticipated. The patient indicates understanding of these issues and agrees with the plan.

## 2013-06-17 NOTE — Assessment & Plan Note (Signed)
Well controlled. No changes. 

## 2013-06-17 NOTE — Telephone Encounter (Signed)
Relevant patient education assigned to patient using Emmi. ° °

## 2013-06-17 NOTE — Progress Notes (Signed)
Pre visit review using our clinic review tool, if applicable. No additional management support is needed unless otherwise documented below in the visit note. 

## 2013-06-24 ENCOUNTER — Other Ambulatory Visit: Payer: Self-pay | Admitting: Family Medicine

## 2013-08-25 ENCOUNTER — Other Ambulatory Visit (INDEPENDENT_AMBULATORY_CARE_PROVIDER_SITE_OTHER): Payer: BC Managed Care – PPO

## 2013-08-25 DIAGNOSIS — D473 Essential (hemorrhagic) thrombocythemia: Secondary | ICD-10-CM

## 2013-08-25 LAB — CBC WITH DIFFERENTIAL/PLATELET
BASOS PCT: 0.7 % (ref 0.0–3.0)
Basophils Absolute: 0.1 10*3/uL (ref 0.0–0.1)
Eosinophils Absolute: 0.2 10*3/uL (ref 0.0–0.7)
Eosinophils Relative: 2.6 % (ref 0.0–5.0)
HCT: 48.4 % (ref 39.0–52.0)
Hemoglobin: 16.3 g/dL (ref 13.0–17.0)
LYMPHS PCT: 13.4 % (ref 12.0–46.0)
Lymphs Abs: 1 10*3/uL (ref 0.7–4.0)
MCHC: 33.7 g/dL (ref 30.0–36.0)
MCV: 107.2 fl — ABNORMAL HIGH (ref 78.0–100.0)
Monocytes Absolute: 0.5 10*3/uL (ref 0.1–1.0)
Monocytes Relative: 6.3 % (ref 3.0–12.0)
NEUTROS PCT: 77 % (ref 43.0–77.0)
Neutro Abs: 5.6 10*3/uL (ref 1.4–7.7)
PLATELETS: 407 10*3/uL — AB (ref 150.0–400.0)
RBC: 4.52 Mil/uL (ref 4.22–5.81)
RDW: 13 % (ref 11.5–15.5)
WBC: 7.2 10*3/uL (ref 4.0–10.5)

## 2013-08-25 LAB — COMPREHENSIVE METABOLIC PANEL
ALT: 18 U/L (ref 0–53)
AST: 23 U/L (ref 0–37)
Albumin: 4.2 g/dL (ref 3.5–5.2)
Alkaline Phosphatase: 59 U/L (ref 39–117)
BUN: 17 mg/dL (ref 6–23)
CALCIUM: 9.4 mg/dL (ref 8.4–10.5)
CHLORIDE: 105 meq/L (ref 96–112)
CO2: 27 mEq/L (ref 19–32)
Creatinine, Ser: 1.1 mg/dL (ref 0.4–1.5)
GFR: 76.78 mL/min (ref >60.00)
Glucose, Bld: 126 mg/dL — ABNORMAL HIGH (ref 70–99)
POTASSIUM: 4 meq/L (ref 3.5–5.1)
Sodium: 139 mEq/L (ref 135–145)
Total Bilirubin: 1 mg/dL (ref 0.2–1.2)
Total Protein: 6.4 g/dL (ref 6.0–8.3)

## 2013-08-26 LAB — LACTATE DEHYDROGENASE: LDH: 189 U/L (ref 94–250)

## 2013-09-09 ENCOUNTER — Other Ambulatory Visit: Payer: Self-pay | Admitting: Family Medicine

## 2013-09-09 NOTE — Telephone Encounter (Signed)
Ok to refill 

## 2013-09-27 ENCOUNTER — Telehealth: Payer: Self-pay | Admitting: Internal Medicine

## 2013-09-27 NOTE — Telephone Encounter (Signed)
s.w. pt and advised on 8.10 appt moved to 8.19 due to MD on Pal...pt ok and aware

## 2013-10-17 ENCOUNTER — Other Ambulatory Visit: Payer: Self-pay | Admitting: Family Medicine

## 2013-10-19 ENCOUNTER — Other Ambulatory Visit: Payer: Self-pay | Admitting: *Deleted

## 2013-10-19 MED ORDER — DOXAZOSIN MESYLATE 1 MG PO TABS: ORAL_TABLET | ORAL | Status: DC

## 2013-11-01 ENCOUNTER — Ambulatory Visit: Payer: BC Managed Care – PPO | Admitting: Internal Medicine

## 2013-11-01 ENCOUNTER — Other Ambulatory Visit: Payer: BC Managed Care – PPO

## 2013-11-09 ENCOUNTER — Other Ambulatory Visit: Payer: Self-pay | Admitting: *Deleted

## 2013-11-09 DIAGNOSIS — D473 Essential (hemorrhagic) thrombocythemia: Secondary | ICD-10-CM

## 2013-11-10 ENCOUNTER — Telehealth: Payer: Self-pay | Admitting: Internal Medicine

## 2013-11-10 ENCOUNTER — Other Ambulatory Visit (HOSPITAL_BASED_OUTPATIENT_CLINIC_OR_DEPARTMENT_OTHER): Payer: BC Managed Care – PPO

## 2013-11-10 ENCOUNTER — Ambulatory Visit (HOSPITAL_BASED_OUTPATIENT_CLINIC_OR_DEPARTMENT_OTHER): Payer: BC Managed Care – PPO | Admitting: Internal Medicine

## 2013-11-10 ENCOUNTER — Encounter: Payer: Self-pay | Admitting: Internal Medicine

## 2013-11-10 VITALS — BP 127/70 | HR 74 | Temp 97.8°F | Resp 18 | Ht 70.5 in | Wt 188.9 lb

## 2013-11-10 DIAGNOSIS — D473 Essential (hemorrhagic) thrombocythemia: Secondary | ICD-10-CM

## 2013-11-10 LAB — COMPREHENSIVE METABOLIC PANEL (CC13)
ALBUMIN: 4.2 g/dL (ref 3.5–5.0)
ALT: 15 U/L (ref 0–55)
ANION GAP: 8 meq/L (ref 3–11)
AST: 19 U/L (ref 5–34)
Alkaline Phosphatase: 78 U/L (ref 40–150)
BUN: 15.4 mg/dL (ref 7.0–26.0)
CO2: 28 meq/L (ref 22–29)
Calcium: 9.6 mg/dL (ref 8.4–10.4)
Chloride: 103 mEq/L (ref 98–109)
Creatinine: 1.1 mg/dL (ref 0.7–1.3)
GLUCOSE: 115 mg/dL (ref 70–140)
POTASSIUM: 4.3 meq/L (ref 3.5–5.1)
Sodium: 139 mEq/L (ref 136–145)
TOTAL PROTEIN: 6.5 g/dL (ref 6.4–8.3)
Total Bilirubin: 0.43 mg/dL (ref 0.20–1.20)

## 2013-11-10 LAB — CBC WITH DIFFERENTIAL/PLATELET
BASO%: 0.9 % (ref 0.0–2.0)
Basophils Absolute: 0.1 10*3/uL (ref 0.0–0.1)
EOS ABS: 0.1 10*3/uL (ref 0.0–0.5)
EOS%: 1.6 % (ref 0.0–7.0)
HCT: 46.3 % (ref 38.4–49.9)
HEMOGLOBIN: 15.6 g/dL (ref 13.0–17.1)
LYMPH%: 12.6 % — ABNORMAL LOW (ref 14.0–49.0)
MCH: 35.1 pg — ABNORMAL HIGH (ref 27.2–33.4)
MCHC: 33.6 g/dL (ref 32.0–36.0)
MCV: 104.5 fL — AB (ref 79.3–98.0)
MONO#: 0.7 10*3/uL (ref 0.1–0.9)
MONO%: 8.2 % (ref 0.0–14.0)
NEUT%: 76.7 % — ABNORMAL HIGH (ref 39.0–75.0)
NEUTROS ABS: 6.7 10*3/uL — AB (ref 1.5–6.5)
Platelets: 402 10*3/uL — ABNORMAL HIGH (ref 140–400)
RBC: 4.43 10*6/uL (ref 4.20–5.82)
RDW: 13.2 % (ref 11.0–14.6)
WBC: 8.8 10*3/uL (ref 4.0–10.3)
lymph#: 1.1 10*3/uL (ref 0.9–3.3)

## 2013-11-10 LAB — LACTATE DEHYDROGENASE (CC13): LDH: 217 U/L (ref 125–245)

## 2013-11-10 MED ORDER — HYDROXYUREA 500 MG PO CAPS: 500.0000 mg | ORAL_CAPSULE | Freq: Every day | ORAL | Status: DC

## 2013-11-10 NOTE — Progress Notes (Signed)
[facility] Telephone:([phone]) [phone]   Fax:(336) [place], [place] [address]  DIAGNOSIS: Essential thrombocythemia diagnosed in 2006   PRIOR THERAPY: None   CURRENT THERAPY: Hydroxyurea 500 mg by mouth daily except Friday he is on 1000 mg  INTERVAL HISTORY: Dennis Martin 58 y.o. male returns to the clinic today for followup visit. The patient is feeling fine today with no specific complaints except for some tingling and pain in the feet. He was in treatment with naproxen for pain management and aspirin 325 mg by mouth daily but she discontinued these medications because of concern about GI bleed after his wife had significant gastrointestinal bleed and was admitted to the intensive care unit for treatment. He denied having any significant weight loss or night sweats. He has no chest pain, shortness of breath, cough or hemoptysis. The patient is tolerating his current treatment with hydroxyurea fairly well with no significant adverse effects. He has repeat CBC and comprehensive metabolic panel performed earlier today and he is here for evaluation and discussion of his lab results.  MEDICAL HISTORY: Past Medical History  Diagnosis Date  . BPH (benign prostatic hyperplasia)   . Hypertension   . Essential thrombocytosis     ALLERGIES:  is allergic to keflex.  MEDICATIONS:  Current Outpatient Prescriptions  Medication Sig Dispense Refill  . aspirin 325 MG tablet Take 325 mg by mouth daily.      . cetaphil (CETAPHIL) cream Apply topically as needed.      . desonide (DESOWEN) 0.05 % cream Apply topically 2 (two) times daily.  30 g  3  . doxazosin (CARDURA) 1 MG tablet TAKE 1 TABLET AT BEDTIME  90 tablet  1  . FIBER SELECT GUMMIES PO Pt chews 2 Phillips fiber good gummies daily.      . folic acid (FOLVITE) 270 MCG tablet Take 400 mcg by mouth daily.      . hydrocortisone 2.5 % lotion Apply topically daily.        . hydrocortisone-pramoxine (PROCTOFOAM HC) rectal foam Place 1 applicator rectally 2 (two) times daily.  10 g  0  . hydroxyurea (HYDREA) 500 MG capsule TAKE 1 CAPSULE DAILY AND ONFRIDAYS TAKE 2 CAPSULES    (OFFICE VISIT REQUIRED FOR ADDITIONAL REFILLS)  90 capsule  0  . ketoconazole (NIZORAL) 2 % cream Apply 1 application topically daily.      Marland Kitchen ketoconazole (NIZORAL) 2 % shampoo Apply topically 2 (two) times a week.  120 mL  3  . lisinopril (PRINIVIL,ZESTRIL) 10 MG tablet TAKE 1 TABLET DAILY  90 tablet  1  . loratadine (CLARITIN) 10 MG tablet Take 10 mg by mouth daily.      . naproxen (NAPROSYN) 500 MG tablet TAKE 1 TABLET TWICE A DAY  WITH MEALS  180 tablet  3  . OVER THE COUNTER MEDICATION Vitafusion men's complete vitamin gummies; pt chews two gummies daily.      Marland Kitchen triamcinolone cream (KENALOG) 0.1 %        No current facility-administered medications for this visit.    SURGICAL HISTORY:  Past Surgical History  Procedure Laterality Date  . Ulnar nerve transposition  2007    left  . Knee surgery      REVIEW OF SYSTEMS:  A comprehensive review of systems was negative.   PHYSICAL EXAMINATION: General appearance: alert, cooperative and no distress Head: Normocephalic, without obvious abnormality, atraumatic Neck: no  adenopathy Lymph nodes: Cervical, supraclavicular, and axillary nodes normal. Resp: clear to auscultation bilaterally Cardio: regular rate and rhythm, S1, S2 normal, no murmur, click, rub or gallop GI: soft, non-tender; bowel sounds normal; no masses,  no organomegaly Extremities: extremities normal, atraumatic, no cyanosis or edema  ECOG PERFORMANCE STATUS: 0 - Asymptomatic  Blood pressure 127/70, pulse 74, temperature 97.8 F (36.6 C), temperature source Oral, resp. rate 18, height 5' 10.5" (1.791 m), weight 188 lb 14.4 oz (85.684 kg), SpO2 97.00%.  LABORATORY DATA: Lab Results  Component Value Date   WBC 8.8 11/10/2013   HGB 15.6 11/10/2013   HCT 46.3  11/10/2013   MCV 104.5* 11/10/2013   PLT 402* 11/10/2013      Chemistry      Component Value Date/Time   NA 139 11/10/2013 1508   NA 139 08/25/2013 0857   K 4.3 11/10/2013 1508   K 4.0 08/25/2013 0857   CL 105 08/25/2013 0857   CL 102 05/04/2012 1309   CO2 28 11/10/2013 1508   CO2 27 08/25/2013 0857   BUN 15.4 11/10/2013 1508   BUN 17 08/25/2013 0857   CREATININE 1.1 11/10/2013 1508   CREATININE 1.1 08/25/2013 0857      Component Value Date/Time   CALCIUM 9.6 11/10/2013 1508   CALCIUM 9.4 08/25/2013 0857   ALKPHOS 78 11/10/2013 1508   ALKPHOS 59 08/25/2013 0857   AST 19 11/10/2013 1508   AST 23 08/25/2013 0857   ALT 15 11/10/2013 1508   ALT 18 08/25/2013 0857   BILITOT 0.43 11/10/2013 1508   BILITOT 1.0 08/25/2013 0857       RADIOGRAPHIC STUDIES: No results found.  ASSESSMENT AND PLAN: This is a very pleasant 58 years old white male with essential thrombocythemia currently on treatment with hydroxyurea 500 mg by mouth daily except Friday where the patient takes 1000 mg. He is tolerating his treatment fairly well and there is no significant evidence for disease progression on his recent blood count. The pain management I recommend for the patient to take naproxen on the as-needed basis may be once or twice a week. He would also resume treatment with aspirin but at a dose of 81 mg by mouth daily. I recommended for him to continue on hydroxyurea with the current dose. I would see the patient back for followup visit in 6 months with repeat CBC, comprehensive metabolic panel and LDH. He continue to take folic acid over-the-counter 1-2 tablets every day for the macrocytosis resulting from the hydroxyurea treatment.  The patient voices understanding of current disease status and treatment options and is in agreement with the current care plan.  All questions were answered. The patient knows to call the clinic with any problems, questions or concerns. We can certainly see the patient much sooner if  necessary.  Disclaimer: This note was dictated with voice recognition software. Similar sounding words can inadvertently be transcribed and may be missed upon review.

## 2013-11-10 NOTE — Telephone Encounter (Signed)
Pt confirmed labs/ov per 08/19 POF, gave pt AVS....KJ °

## 2013-12-29 ENCOUNTER — Ambulatory Visit (INDEPENDENT_AMBULATORY_CARE_PROVIDER_SITE_OTHER): Payer: BC Managed Care – PPO

## 2013-12-29 DIAGNOSIS — Z23 Encounter for immunization: Secondary | ICD-10-CM

## 2014-01-11 ENCOUNTER — Other Ambulatory Visit: Payer: Self-pay | Admitting: *Deleted

## 2014-01-11 MED ORDER — LISINOPRIL 10 MG PO TABS: ORAL_TABLET | ORAL | Status: DC

## 2014-01-26 ENCOUNTER — Telehealth: Payer: Self-pay | Admitting: Family Medicine

## 2014-01-26 NOTE — Telephone Encounter (Signed)
Spoke to pt who states he would prefer to have those labs done at this office as it is closer. Advised pt to schedule lab appt and that labs were to be ordered

## 2014-01-26 NOTE — Telephone Encounter (Signed)
Pt. calling and wants to have lab work ordered. Said he routinely has it done thru Dr Earlie Server. Please advise. Thank you!

## 2014-01-26 NOTE — Telephone Encounter (Signed)
We can check CMET, CBC and LDH here if he would prefer.

## 2014-02-04 ENCOUNTER — Other Ambulatory Visit: Payer: Self-pay | Admitting: Internal Medicine

## 2014-02-09 ENCOUNTER — Other Ambulatory Visit (INDEPENDENT_AMBULATORY_CARE_PROVIDER_SITE_OTHER): Payer: BC Managed Care – PPO

## 2014-02-09 ENCOUNTER — Telehealth: Payer: Self-pay | Admitting: Internal Medicine

## 2014-02-09 DIAGNOSIS — D473 Essential (hemorrhagic) thrombocythemia: Secondary | ICD-10-CM

## 2014-02-09 LAB — CBC WITH DIFFERENTIAL/PLATELET
Basophils Absolute: 0 10*3/uL (ref 0.0–0.1)
Basophils Relative: 0.6 % (ref 0.0–3.0)
EOS PCT: 2 % (ref 0.0–5.0)
Eosinophils Absolute: 0.1 10*3/uL (ref 0.0–0.7)
HEMATOCRIT: 49.1 % (ref 39.0–52.0)
HEMOGLOBIN: 16.3 g/dL (ref 13.0–17.0)
LYMPHS ABS: 0.9 10*3/uL (ref 0.7–4.0)
Lymphocytes Relative: 13.2 % (ref 12.0–46.0)
MCHC: 33.2 g/dL (ref 30.0–36.0)
MCV: 105.9 fl — AB (ref 78.0–100.0)
MONO ABS: 0.5 10*3/uL (ref 0.1–1.0)
Monocytes Relative: 8 % (ref 3.0–12.0)
Neutro Abs: 5.1 10*3/uL (ref 1.4–7.7)
Neutrophils Relative %: 76.2 % (ref 43.0–77.0)
PLATELETS: 411 10*3/uL — AB (ref 150.0–400.0)
RBC: 4.64 Mil/uL (ref 4.22–5.81)
RDW: 13.3 % (ref 11.5–15.5)
WBC: 6.6 10*3/uL (ref 4.0–10.5)

## 2014-02-09 LAB — LACTATE DEHYDROGENASE: LDH: 182 U/L (ref 94–250)

## 2014-02-09 LAB — COMPREHENSIVE METABOLIC PANEL
ALT: 17 U/L (ref 0–53)
AST: 19 U/L (ref 0–37)
Albumin: 4.6 g/dL (ref 3.5–5.2)
Alkaline Phosphatase: 62 U/L (ref 39–117)
BILIRUBIN TOTAL: 0.9 mg/dL (ref 0.2–1.2)
BUN: 17 mg/dL (ref 6–23)
CO2: 31 mEq/L (ref 19–32)
CREATININE: 1.2 mg/dL (ref 0.4–1.5)
Calcium: 9.3 mg/dL (ref 8.4–10.5)
Chloride: 102 mEq/L (ref 96–112)
GFR: 69.77 mL/min (ref >60.00)
GLUCOSE: 87 mg/dL (ref 70–99)
Potassium: 4.7 mEq/L (ref 3.5–5.1)
Sodium: 140 mEq/L (ref 135–145)
Total Protein: 6.8 g/dL (ref 6.0–8.3)

## 2014-02-09 NOTE — Telephone Encounter (Signed)
pt called to cx appt he is having all bloodwork done at [facility]

## 2014-04-01 ENCOUNTER — Other Ambulatory Visit: Payer: Self-pay | Admitting: *Deleted

## 2014-04-01 MED ORDER — LISINOPRIL 10 MG PO TABS: ORAL_TABLET | ORAL | Status: DC

## 2014-04-11 ENCOUNTER — Other Ambulatory Visit: Payer: BC Managed Care – PPO

## 2014-04-14 ENCOUNTER — Encounter: Payer: BC Managed Care – PPO | Admitting: Family Medicine

## 2014-04-25 ENCOUNTER — Other Ambulatory Visit: Payer: Self-pay | Admitting: Family Medicine

## 2014-04-25 DIAGNOSIS — Z0000 Encounter for general adult medical examination without abnormal findings: Secondary | ICD-10-CM

## 2014-04-25 DIAGNOSIS — D473 Essential (hemorrhagic) thrombocythemia: Secondary | ICD-10-CM

## 2014-04-27 ENCOUNTER — Other Ambulatory Visit (INDEPENDENT_AMBULATORY_CARE_PROVIDER_SITE_OTHER): Payer: BLUE CROSS/BLUE SHIELD

## 2014-04-27 DIAGNOSIS — D473 Essential (hemorrhagic) thrombocythemia: Secondary | ICD-10-CM

## 2014-04-27 DIAGNOSIS — Z0000 Encounter for general adult medical examination without abnormal findings: Secondary | ICD-10-CM

## 2014-04-27 LAB — CBC WITH DIFFERENTIAL/PLATELET
Basophils Absolute: 0 10*3/uL (ref 0.0–0.1)
Basophils Relative: 0.6 % (ref 0.0–3.0)
Eosinophils Absolute: 0.1 10*3/uL (ref 0.0–0.7)
Eosinophils Relative: 1.9 % (ref 0.0–5.0)
HEMATOCRIT: 46.3 % (ref 39.0–52.0)
Hemoglobin: 15.9 g/dL (ref 13.0–17.0)
LYMPHS ABS: 0.9 10*3/uL (ref 0.7–4.0)
Lymphocytes Relative: 14.7 % (ref 12.0–46.0)
MCHC: 34.2 g/dL (ref 30.0–36.0)
MCV: 103.7 fl — AB (ref 78.0–100.0)
MONO ABS: 0.5 10*3/uL (ref 0.1–1.0)
MONOS PCT: 7.7 % (ref 3.0–12.0)
Neutro Abs: 4.5 10*3/uL (ref 1.4–7.7)
Neutrophils Relative %: 75.1 % (ref 43.0–77.0)
Platelets: 403 10*3/uL — ABNORMAL HIGH (ref 150.0–400.0)
RBC: 4.47 Mil/uL (ref 4.22–5.81)
RDW: 13.8 % (ref 11.5–15.5)
WBC: 6 10*3/uL (ref 4.0–10.5)

## 2014-04-27 LAB — LIPID PANEL
Cholesterol: 150 mg/dL (ref 0–200)
HDL: 48 mg/dL (ref >39.00)
LDL Cholesterol: 81 mg/dL (ref 0–99)
NonHDL: 102
TRIGLYCERIDES: 106 mg/dL (ref 0.0–149.0)
Total CHOL/HDL Ratio: 3
VLDL: 21.2 mg/dL (ref 0.0–40.0)

## 2014-04-27 LAB — COMPREHENSIVE METABOLIC PANEL
ALK PHOS: 64 U/L (ref 39–117)
ALT: 15 U/L (ref 0–53)
AST: 16 U/L (ref 0–37)
Albumin: 4.3 g/dL (ref 3.5–5.2)
BILIRUBIN TOTAL: 0.6 mg/dL (ref 0.2–1.2)
BUN: 17 mg/dL (ref 6–23)
CHLORIDE: 103 meq/L (ref 96–112)
CO2: 33 mEq/L — ABNORMAL HIGH (ref 19–32)
CREATININE: 1.07 mg/dL (ref 0.40–1.50)
Calcium: 9.6 mg/dL (ref 8.4–10.5)
GFR: 75.77 mL/min (ref >60.00)
Glucose, Bld: 93 mg/dL (ref 70–99)
POTASSIUM: 5.2 meq/L — AB (ref 3.5–5.1)
SODIUM: 139 meq/L (ref 135–145)
Total Protein: 5.8 g/dL — ABNORMAL LOW (ref 6.0–8.3)

## 2014-04-27 LAB — PSA: PSA: 0.46 ng/mL (ref 0.10–4.00)

## 2014-04-27 LAB — LACTATE DEHYDROGENASE: LDH: 185 U/L (ref 94–250)

## 2014-04-29 ENCOUNTER — Other Ambulatory Visit: Payer: Self-pay | Admitting: *Deleted

## 2014-04-29 NOTE — Telephone Encounter (Signed)
Pt had recent PSA with upcoming CPE. pls advise

## 2014-05-02 MED ORDER — DOXAZOSIN MESYLATE 1 MG PO TABS: ORAL_TABLET | ORAL | Status: DC

## 2014-05-03 ENCOUNTER — Encounter: Payer: Self-pay | Admitting: Family Medicine

## 2014-05-03 ENCOUNTER — Ambulatory Visit (INDEPENDENT_AMBULATORY_CARE_PROVIDER_SITE_OTHER): Payer: BLUE CROSS/BLUE SHIELD | Admitting: Family Medicine

## 2014-05-03 VITALS — BP 120/68 | HR 84 | Temp 97.9°F | Ht 70.75 in | Wt 184.5 lb

## 2014-05-03 DIAGNOSIS — R5383 Other fatigue: Secondary | ICD-10-CM

## 2014-05-03 DIAGNOSIS — R208 Other disturbances of skin sensation: Secondary | ICD-10-CM

## 2014-05-03 DIAGNOSIS — E875 Hyperkalemia: Secondary | ICD-10-CM

## 2014-05-03 DIAGNOSIS — Z0000 Encounter for general adult medical examination without abnormal findings: Secondary | ICD-10-CM

## 2014-05-03 DIAGNOSIS — I10 Essential (primary) hypertension: Secondary | ICD-10-CM

## 2014-05-03 DIAGNOSIS — D473 Essential (hemorrhagic) thrombocythemia: Secondary | ICD-10-CM

## 2014-05-03 DIAGNOSIS — R209 Unspecified disturbances of skin sensation: Secondary | ICD-10-CM

## 2014-05-03 DIAGNOSIS — N400 Enlarged prostate without lower urinary tract symptoms: Secondary | ICD-10-CM

## 2014-05-03 NOTE — Progress Notes (Signed)
Subjective:    Patient ID: Dennis Martin, male    DOB: [birth year], 57 y.o.   MRN: [id number]  HPI  Pleasant 59 yo male here for CPX.  Colonoscopy 05/25/12 (pyrtle)- recall in 10 years. Flu vaccine 12/29/13 tdap 04/07/13  He is concerned about decreased libido.  Has noticed this over the last 6 months.  Feels like his relationship is fine.  No difficulty getting or maintaining erections.  He has been tired.  He wonders if his testosterone is low.  Essential Thrombocytosis- diagnosed in 2006. Takes hydroxyurea. CBC has been stable. Followed by hematology (Dr. Earlie Server)- his platelets have been in 300-400s.  Last saw him on 11/10/13- note reviewed.  Suggested follow up in 6 months. Lab Results  Component Value Date   WBC 6.0 04/27/2014   HGB 15.9 04/27/2014   HCT 46.3 04/27/2014   MCV 103.7* 04/27/2014   PLT 403.0* 04/27/2014   He is concerned that sometimes his feet are cold.  Not painful.  Only his wife mentions this at night.  HTN- on lisinopril 10 mg daily. Denies any HA, blurred vision, dizziness with standing, LE edema, CP or SOB. Lab Results  Component Value Date   CREATININE 1.07 04/27/2014   Hyperkalemia- Potassium mildly elevated at 5.2.  Does admit to eating a lot of potatoes- both yellow and sweet.  BPH- has been on cardura 1 mg for years.   He does not have to wake up at night to urinate.  Does sometimes feel like he is not emptying his bladder well but this is unchanged.  Lab Results  Component Value Date   PSA 0.46 04/27/2014   PSA 0.43 06/17/2013   PSA 0.48 04/14/2012    Lab Results  Component Value Date   CHOL 150 04/27/2014   HDL 48.00 04/27/2014   LDLCALC 81 04/27/2014   TRIG 106.0 04/27/2014   CHOLHDL 3 04/27/2014     Patient Active Problem List   Diagnosis Date Noted  . Routine general medical examination at a health care facility 04/14/2012  . Essential thrombocytosis 08/12/2011  . HTN (hypertension) 08/12/2011  . BPH (benign prostatic  hyperplasia) 08/12/2011   Past Medical History  Diagnosis Date  . BPH (benign prostatic hyperplasia)   . Hypertension   . Essential thrombocytosis    Past Surgical History  Procedure Laterality Date  . Ulnar nerve transposition  2007    left  . Knee surgery     History  Substance Use Topics  . Smoking status: Former Smoker    Quit date: 05/06/2003  . Smokeless tobacco: Never Used     Comment: Quit 2005  . Alcohol Use: 1.2 oz/week    2 Cans of beer per week     Comment: occ   Family History  Problem Relation Age of Onset  . Stomach cancer Neg Hx   . Colon cancer Paternal Aunt 40   Allergies  Allergen Reactions  . Keflex [Cephalexin]     rash   Current Outpatient Prescriptions on File Prior to Visit  Medication Sig Dispense Refill  . aspirin 81 MG tablet Take 81 mg by mouth daily.    . cetaphil (CETAPHIL) cream Apply topically as needed.    . desonide (DESOWEN) 0.05 % cream Apply topically 2 (two) times daily. 30 g 3  . doxazosin (CARDURA) 1 MG tablet TAKE 1 TABLET AT BEDTIME 90 tablet 1  . FIBER SELECT GUMMIES PO Pt chews 2 Phillips fiber good gummies daily.    . folic acid (  FOLVITE) 400 MCG tablet Take 400 mcg by mouth daily.    . hydrocortisone 2.5 % lotion Apply topically daily.    . hydroxyurea (HYDREA) 500 MG capsule TAKE 1 CAPSULE DAILY MAY   TAKE WITH FOOD TO MINIMIZE GASTROINTESTINAL SIDE      EFFECTS 90 capsule 0  . ketoconazole (NIZORAL) 2 % cream Apply 1 application topically daily.    Marland Kitchen ketoconazole (NIZORAL) 2 % shampoo Apply topically 2 (two) times a week. 120 mL 3  . lisinopril (PRINIVIL,ZESTRIL) 10 MG tablet TAKE 1 TABLET DAILY 90 tablet 0  . loratadine (CLARITIN) 10 MG tablet Take 10 mg by mouth daily.    . naproxen (NAPROSYN) 500 MG tablet TAKE 1 TABLET TWICE A DAY  WITH MEALS as needed    . OVER THE COUNTER MEDICATION Vitafusion men's complete vitamin gummies; pt chews two gummies daily.    Marland Kitchen triamcinolone cream (KENALOG) 0.1 %      No current  facility-administered medications on file prior to visit.   The PMH, PSH, Social History, Family History, Medications, and allergies have been reviewed in [facility], and have been updated if relevant.    Review of Systems  Constitutional: Positive for fatigue. Negative for fever, chills, diaphoresis and unexpected weight change.  HENT: Negative.   Eyes: Negative.   Respiratory: Negative.   Endocrine: Negative.   Genitourinary: Negative.   Musculoskeletal: Negative.   Skin: Negative.   Allergic/Immunologic: Negative.   Neurological: Negative.   Psychiatric/Behavioral: Negative.         Objective:   Physical Exam BP 120/68 mmHg  Pulse 84  Temp(Src) 97.9 F (36.6 C) (Oral)  Ht 5' 10.75" (1.797 m)  Wt 184 lb 8 oz (83.689 kg)  BMI 25.92 kg/m2  SpO2 98% Wt Readings from Last 3 Encounters:  05/03/14 184 lb 8 oz (83.689 kg)  11/10/13 188 lb 14.4 oz (85.684 kg)  06/17/13 190 lb 12 oz (86.524 kg)     General:  pleasant male in NAD Eyes:  PERRL Ears:  External ear exam shows no significant lesions or deformities.  Otoscopic examination reveals clear canals, tympanic membranes are intact bilaterally without bulging, retraction, inflammation or discharge. Hearing is grossly normal bilaterally. Nose:  External nasal examination shows no deformity or inflammation. Nasal mucosa are pink and moist without lesions or exudates. Mouth:  Oral mucosa and oropharynx without lesions or exudates.  Teeth in good repair. Neck:  no carotid bruit or thyromegaly no cervical or supraclavicular lymphadenopathy  Lungs:  Normal respiratory effort, chest expands symmetrically. Lungs are clear to auscultation, no crackles or wheezes. Heart:  Normal rate and regular rhythm. S1 and S2 normal without gallop, murmur, click, rub or other extra sounds. Abdomen:  Bowel sounds positive,abdomen soft and non-tender without masses, organomegaly or hernias noted. Pulses:  R and L posterior tibial pulses are full and equal  bilaterally  Extremities:  no edema  Pedal pulses normal bilaterally, feet do not feel cold to touch, normal sensation     Assessment & Plan:

## 2014-05-03 NOTE — Assessment & Plan Note (Signed)
Blood work stable. Followed by Dr. Inda Merlin but he would like for me to take over blood work and contact Dr. Earlie Server if there are any issues. This seems reasonable. Continue current rx.

## 2014-05-03 NOTE — Assessment & Plan Note (Signed)
Stable on current rx. No changes made. 

## 2014-05-03 NOTE — Assessment & Plan Note (Signed)
Reviewed preventive care protocols, scheduled due services, and updated immunizations Discussed nutrition, exercise, diet, and healthy lifestyle.  

## 2014-05-03 NOTE — Progress Notes (Signed)
Pre visit review using our clinic review tool, if applicable. No additional management support is needed unless otherwise documented below in the visit note. 

## 2014-05-03 NOTE — Assessment & Plan Note (Signed)
New- advised to cut back on potato intake. Also given lists of foods higher potassium. The patient indicates understanding of these issues and agrees with the plan.

## 2014-05-03 NOTE — Assessment & Plan Note (Signed)
Labs reassuring but I agreed to check am testosterone level- he will schedule lab visit on his way out.

## 2014-05-03 NOTE — Assessment & Plan Note (Signed)
Exam reassuring. Reassurance provided. Call or return to clinic prn if these symptoms worsen or fail to improve as anticipated. The patient indicates understanding of these issues and agrees with the plan.

## 2014-05-03 NOTE — Patient Instructions (Signed)
Potassium Content of Foods  Potassium is a mineral found in many foods and drinks. It helps keep fluids and minerals balanced in your body and affects how steadily your heart beats. Potassium also helps control your blood pressure and keep your muscles and nervous system healthy.  Certain health conditions and medicines may change the balance of potassium in your body. When this happens, you can help balance your level of potassium through the foods that you do or do not eat. Your health care provider or dietitian may recommend an amount of potassium that you should have each day. The following lists of foods provide the amount of potassium (in parentheses) per serving in each item.  HIGH IN POTASSIUM   The following foods and beverages have 200 mg or more of potassium per serving:  · Apricots, 2 raw or 5 dry (200 mg).  · Artichoke, 1 medium (345 mg).  · Avocado, raw,  ¼ each (245 mg).  · Banana, 1 medium (425 mg).  · Beans, lima, or baked beans, canned, ½ cup (280 mg).  · Beans, white, canned, ½ cup (595 mg).  · Beef roast, 3 oz (320 mg).  · Beef, ground, 3 oz (270 mg).  · Beets, raw or cooked, ½ cup (260 mg).  · Bran muffin, 2 oz (300 mg).  · Broccoli, ½ cup (230 mg).  · Brussels sprouts, ½ cup (250 mg).  · Cantaloupe, ½ cup (215 mg).  · Cereal, 100% bran, ½ cup (200-400 mg).  · Cheeseburger, single, fast food, 1 each (225-400 mg).  · Chicken, 3 oz (220 mg).  · Clams, canned, 3 oz (535 mg).  · Crab, 3 oz (225 mg).  · Dates, 5 each (270 mg).  · Dried beans and peas, ½ cup (300-475 mg).  · Figs, dried, 2 each (260 mg).  · Fish: halibut, tuna, cod, snapper, 3 oz (480 mg).  · Fish: salmon, haddock, swordfish, perch, 3 oz (300 mg).  · Fish, tuna, canned 3 oz (200 mg).  · French fries, fast food, 3 oz (470 mg).  · Granola with fruit and nuts, ½ cup (200 mg).  · Grapefruit juice, ½ cup (200 mg).  · Greens, beet, ½ cup (655 mg).  · Honeydew melon, ½ cup (200 mg).  · Kale, raw, 1 cup (300 mg).  · Kiwi, 1 medium (240  mg).  · Kohlrabi, rutabaga, parsnips, ½ cup (280 mg).  · Lentils, ½ cup (365 mg).  · Mango, 1 each (325 mg).  · Milk, chocolate, 1 cup (420 mg).  · Milk: nonfat, low-fat, whole, buttermilk, 1 cup (350-380 mg).  · Molasses, 1 Tbsp (295 mg).  · Mushrooms, ½ cup (280) mg.  · Nectarine, 1 each (275 mg).  · Nuts: almonds, peanuts, hazelnuts, [place], cashew, mixed, 1 oz (200 mg).  · Nuts, pistachios, 1 oz (295 mg).  · Orange, 1 each (240 mg).  · Orange juice, ½ cup (235 mg).  · Papaya, medium, ½ fruit (390 mg).  · Peanut butter, chunky, 2 Tbsp (240 mg).  · Peanut butter, smooth, 2 Tbsp (210 mg).  · Pear, 1 medium (200 mg).  · Pomegranate, 1 whole (400 mg).  · Pomegranate juice, ½ cup (215 mg).  · Pork, 3 oz (350 mg).  · Potato chips, salted, 1 oz (465 mg).  · Potato, baked with skin, 1 medium (925 mg).  · Potatoes, boiled, ½ cup (255 mg).  · Potatoes, mashed, ½ cup (330 mg).  · Prune juice, ½ cup (  370 mg).  · Prunes, 5 each (305 mg).  · Pudding, chocolate, ½ cup (230 mg).  · Pumpkin, canned, ½ cup (250 mg).  · Raisins, seedless, ¼ cup (270 mg).  · Seeds, sunflower or pumpkin, 1 oz (240 mg).  · Soy milk, 1 cup (300 mg).  · Spinach, ½ cup (420 mg).  · Spinach, canned, ½ cup (370 mg).  · Sweet potato, baked with skin, 1 medium (450 mg).  · Swiss chard, ½ cup (480 mg).  · Tomato or vegetable juice, ½ cup (275 mg).  · Tomato sauce or puree, ½ cup (400-550 mg).  · Tomato, raw, 1 medium (290 mg).  · Tomatoes, canned, ½ cup (200-300 mg).  · Turkey, 3 oz (250 mg).  · Wheat germ, 1 oz (250 mg).  · Winter squash, ½ cup (250 mg).  · Yogurt, plain or fruited, 6 oz (260-435 mg).  · Zucchini, ½ cup (220 mg).  MODERATE IN POTASSIUM  The following foods and beverages have 50-200 mg of potassium per serving:  · Apple, 1 each (150 mg).  · Apple juice, ½ cup (150 mg).  · Applesauce, ½ cup (90 mg).  · Apricot nectar, ½ cup (140 mg).  · Asparagus, small spears, ½ cup or 6 spears (155 mg).  · Bagel, cinnamon raisin, 1 each (130 mg).  · Bagel,  egg or plain, 4 in., 1 each (70 mg).  · Beans, green, ½ cup (90 mg).  · Beans, yellow, ½ cup (190 mg).  · Beer, regular, 12 oz (100 mg).  · Beets, canned, ½ cup (125 mg).  · Blackberries, ½ cup (115 mg).  · Blueberries, ½ cup (60 mg).  · Bread, whole wheat, 1 slice (70 mg).  · Broccoli, raw, ½ cup (145 mg).  · Cabbage, ½ cup (150 mg).  · Carrots, cooked or raw, ½ cup (180 mg).  · Cauliflower, raw, ½ cup (150 mg).  · Celery, raw, ½ cup (155 mg).  · Cereal, bran flakes, ½cup (120-150 mg).  · Cheese, cottage, ½ cup (110 mg).  · Cherries, 10 each (150 mg).  · Chocolate, 1½ oz bar (165 mg).  · Coffee, brewed 6 oz (90 mg).  · Corn, ½ cup or 1 ear (195 mg).  · Cucumbers, ½ cup (80 mg).  · Egg, large, 1 each (60 mg).  · Eggplant, ½ cup (60 mg).  · Endive, raw, ½cup (80 mg).  · English muffin, 1 each (65 mg).  · Fish, orange roughy, 3 oz (150 mg).  · Frankfurter, beef or pork, 1 each (75 mg).  · Fruit cocktail, ½ cup (115 mg).  · Grape juice, ½ cup (170 mg).  · Grapefruit, ½ fruit (175 mg).  · Grapes, ½ cup (155 mg).  · Greens: kale, turnip, collard, ½ cup (110-150 mg).  · Ice cream or frozen yogurt, chocolate, ½ cup (175 mg).  · Ice cream or frozen yogurt, vanilla, ½ cup (120-150 mg).  · Lemons, limes, 1 each (80 mg).  · Lettuce, all types, 1 cup (100 mg).  · Mixed vegetables, ½ cup (150 mg).  · Mushrooms, raw, ½ cup (110 mg).  · Nuts: walnuts, pecans, or macadamia, 1 oz (125 mg).  · Oatmeal, ½ cup (80 mg).  · Okra, ½ cup (110 mg).  · Onions, raw, ½ cup (120 mg).  · Peach, 1 each (185 mg).  · Peaches, canned, ½ cup (120 mg).  · Pears, canned, ½ cup (120 mg).  · Peas, green,   frozen, ½ cup (90 mg).  · Peppers, green, ½ cup (130 mg).  · Peppers, red, ½ cup (160 mg).  · Pineapple juice, ½ cup (165 mg).  · Pineapple, fresh or canned, ½ cup (100 mg).  · Plums, 1 each (105 mg).  · Pudding, vanilla, ½ cup (150 mg).  · Raspberries, ½ cup (90 mg).  · Rhubarb, ½ cup (115 mg).  · Rice, wild, ½ cup (80 mg).  · Shrimp, 3 oz (155  mg).  · Spinach, raw, 1 cup (170 mg).  · Strawberries, ½ cup (125 mg).  · Summer squash ½ cup (175-200 mg).  · Swiss chard, raw, 1 cup (135 mg).  · Tangerines, 1 each (140 mg).  · Tea, brewed, 6 oz (65 mg).  · Turnips, ½ cup (140 mg).  · Watermelon, ½ cup (85 mg).  · Wine, red, table, 5 oz (180 mg).  · Wine, white, table, 5 oz (100 mg).  LOW IN POTASSIUM  The following foods and beverages have less than 50 mg of potassium per serving.  · Bread, white, 1 slice (30 mg).  · Carbonated beverages, 12 oz (less than 5 mg).  · Cheese, 1 oz (20-30 mg).  · Cranberries, ½ cup (45 mg).  · Cranberry juice cocktail, ½ cup (20 mg).  · Fats and oils, 1 Tbsp (less than 5 mg).  · Hummus, 1 Tbsp (32 mg).  · Nectar: papaya, mango, or pear, ½ cup (35 mg).  · Rice, white or brown, ½ cup (50 mg).  · Spaghetti or macaroni, ½ cup cooked (30 mg).  · Tortilla, flour or corn, 1 each (50 mg).  · Waffle, 4 in., 1 each (50 mg).  · Water chestnuts, ½ cup (40 mg).  Document Released: 10/23/2004 Document Revised: 03/16/2013 Document Reviewed: 02/05/2013  Exit[employer]® Patient Information ©2015 [employer]. This information is not intended to replace advice given to you by your health care provider. Make sure you discuss any questions you have with your health care provider.

## 2014-05-04 ENCOUNTER — Other Ambulatory Visit: Payer: Self-pay | Admitting: Family Medicine

## 2014-05-04 ENCOUNTER — Other Ambulatory Visit (INDEPENDENT_AMBULATORY_CARE_PROVIDER_SITE_OTHER): Payer: BLUE CROSS/BLUE SHIELD

## 2014-05-04 DIAGNOSIS — R5383 Other fatigue: Secondary | ICD-10-CM

## 2014-05-04 DIAGNOSIS — R7989 Other specified abnormal findings of blood chemistry: Secondary | ICD-10-CM

## 2014-05-04 LAB — TESTOSTERONE: TESTOSTERONE: 243.08 ng/dL — AB (ref 300.00–890.00)

## 2014-05-11 ENCOUNTER — Ambulatory Visit: Payer: BC Managed Care – PPO | Admitting: Internal Medicine

## 2014-05-11 ENCOUNTER — Other Ambulatory Visit: Payer: BC Managed Care – PPO

## 2014-06-17 ENCOUNTER — Encounter: Payer: Self-pay | Admitting: Family Medicine

## 2014-06-17 ENCOUNTER — Other Ambulatory Visit: Payer: Self-pay | Admitting: Internal Medicine

## 2014-06-17 ENCOUNTER — Telehealth: Payer: Self-pay | Admitting: *Deleted

## 2014-06-17 NOTE — Telephone Encounter (Signed)
Pt requesting refill on Hydrea 500mg  daily.  Labs last drawn 2/3 PLT 403 No f/u appts in Epic Pt states "my labs and f/u appts are done by Dr. Arnette Norris unless something is out of wack, then I'm to go see Dr. Julien Nordmann." Copy of labs left for MD to review.  Advised pt we will notify him if rx is not refilled or any changes are made. No further concerns at this time.

## 2014-06-18 ENCOUNTER — Other Ambulatory Visit: Payer: Self-pay

## 2014-06-21 ENCOUNTER — Other Ambulatory Visit: Payer: Self-pay | Admitting: *Deleted

## 2014-06-21 MED ORDER — LISINOPRIL 10 MG PO TABS: ORAL_TABLET | ORAL | Status: DC

## 2014-09-02 ENCOUNTER — Encounter: Payer: Self-pay | Admitting: Family Medicine

## 2014-09-02 ENCOUNTER — Telehealth: Payer: Self-pay | Admitting: *Deleted

## 2014-09-02 ENCOUNTER — Other Ambulatory Visit: Payer: Self-pay

## 2014-09-02 MED ORDER — HYDROXYUREA 500 MG PO CAPS: ORAL_CAPSULE | ORAL | Status: DC

## 2014-09-02 NOTE — Telephone Encounter (Signed)
Pt left v/m requesting refill hydroxyurea to [facility]. Pt last seen annual exam on 05/03/14. Dr Earlie Server filled last time.Please advise.

## 2014-09-02 NOTE — Telephone Encounter (Signed)
PER 11/10/13 DICTATION PT. TO SEE DR.MOHAMED IN SIX MONTHS. ON 05/11/14 PT.'S LAB AND MD APPOINTMENT WAS CANCELLED. PT. STATES HE HAS HIS LABS DRAWN AT DR.ARON'S OFFICE. HE WILL CHECK WITH DR.ARON'S OFFICE CONCERNING A REFILL FOR HIS HYDREA.

## 2014-09-04 ENCOUNTER — Encounter: Payer: Self-pay | Admitting: Internal Medicine

## 2014-09-05 MED ORDER — HYDROXYUREA 500 MG PO CAPS
ORAL_CAPSULE | ORAL | Status: DC
Start: 2014-09-05 — End: 2015-05-18

## 2014-09-06 ENCOUNTER — Encounter: Payer: Self-pay | Admitting: Internal Medicine

## 2014-09-07 ENCOUNTER — Other Ambulatory Visit: Payer: Self-pay | Admitting: Medical Oncology

## 2014-09-07 ENCOUNTER — Telehealth: Payer: Self-pay | Admitting: *Deleted

## 2014-09-07 ENCOUNTER — Telehealth: Payer: Self-pay | Admitting: Medical Oncology

## 2014-09-07 DIAGNOSIS — D473 Essential (hemorrhagic) thrombocythemia: Secondary | ICD-10-CM

## 2014-09-07 NOTE — Telephone Encounter (Signed)
Labs entered.

## 2014-09-07 NOTE — Telephone Encounter (Signed)
Pt wants Dr Julien Nordmann to order his platelet count and the lab be drawn  at [facility] because Dr Deborra Medina is monitoring platelets  and ordering his hydrea. I explained to pt that Dr Deborra Medina needs to place orders so [place] can see them in epic. I called Dr Hulen Shouts office and Dr Deborra Medina will order his labs to monitor platelets and she will do his hydrea refills. If she thinks pt needs to see Julien Nordmann she will call for appt. Pt notified via email.

## 2014-09-07 NOTE — Telephone Encounter (Signed)
Spoke to Diane, who states that the pt has contacted them requesting CBC labs. She states that Dr Deborra Medina will be managing his Hydrea medication and will periodically need CBC labs drawn to confirm his platelet count. Diane states that labs should be drawn appx every 51mos but that Dr Deborra Medina could make the final decision on when labs are to be repeated for med refills. Spoke to pt and advised. States his most recent CBC was in March at a location outside of [facility].

## 2014-09-07 NOTE — Telephone Encounter (Signed)
Spoke to pt and informed him labs have been entered and he states he will cb to schedule lab appt

## 2014-09-09 ENCOUNTER — Other Ambulatory Visit (INDEPENDENT_AMBULATORY_CARE_PROVIDER_SITE_OTHER): Payer: BLUE CROSS/BLUE SHIELD

## 2014-09-09 DIAGNOSIS — D473 Essential (hemorrhagic) thrombocythemia: Secondary | ICD-10-CM

## 2014-09-09 LAB — CBC WITH DIFFERENTIAL/PLATELET
BASOS PCT: 0.9 % (ref 0.0–3.0)
Basophils Absolute: 0.1 10*3/uL (ref 0.0–0.1)
Eosinophils Absolute: 0.1 10*3/uL (ref 0.0–0.7)
Eosinophils Relative: 1.8 % (ref 0.0–5.0)
HEMATOCRIT: 47.6 % (ref 39.0–52.0)
Hemoglobin: 16 g/dL (ref 13.0–17.0)
LYMPHS ABS: 0.9 10*3/uL (ref 0.7–4.0)
Lymphocytes Relative: 13.2 % (ref 12.0–46.0)
MCHC: 33.5 g/dL (ref 30.0–36.0)
MCV: 105.3 fl — AB (ref 78.0–100.0)
MONO ABS: 0.5 10*3/uL (ref 0.1–1.0)
Monocytes Relative: 6.9 % (ref 3.0–12.0)
Neutro Abs: 5 10*3/uL (ref 1.4–7.7)
Neutrophils Relative %: 77.2 % — ABNORMAL HIGH (ref 43.0–77.0)
Platelets: 419 10*3/uL — ABNORMAL HIGH (ref 150.0–400.0)
RBC: 4.52 Mil/uL (ref 4.22–5.81)
RDW: 13.5 % (ref 11.5–15.5)
WBC: 6.5 10*3/uL (ref 4.0–10.5)

## 2014-09-09 LAB — LACTATE DEHYDROGENASE: LDH: 196 U/L (ref 94–250)

## 2014-09-12 ENCOUNTER — Encounter: Payer: Self-pay | Admitting: *Deleted

## 2014-11-23 ENCOUNTER — Encounter: Payer: Self-pay | Admitting: Family Medicine

## 2014-11-23 MED ORDER — DOXAZOSIN MESYLATE 1 MG PO TABS: ORAL_TABLET | ORAL | Status: DC

## 2014-11-23 MED ORDER — LISINOPRIL 10 MG PO TABS: ORAL_TABLET | ORAL | Status: DC

## 2014-12-08 ENCOUNTER — Other Ambulatory Visit: Payer: Self-pay | Admitting: Family Medicine

## 2014-12-08 DIAGNOSIS — Z0000 Encounter for general adult medical examination without abnormal findings: Secondary | ICD-10-CM

## 2014-12-08 DIAGNOSIS — D473 Essential (hemorrhagic) thrombocythemia: Secondary | ICD-10-CM

## 2014-12-09 ENCOUNTER — Other Ambulatory Visit (INDEPENDENT_AMBULATORY_CARE_PROVIDER_SITE_OTHER): Payer: BLUE CROSS/BLUE SHIELD

## 2014-12-09 ENCOUNTER — Ambulatory Visit (INDEPENDENT_AMBULATORY_CARE_PROVIDER_SITE_OTHER): Payer: BLUE CROSS/BLUE SHIELD

## 2014-12-09 DIAGNOSIS — Z23 Encounter for immunization: Secondary | ICD-10-CM | POA: Diagnosis not present

## 2014-12-09 DIAGNOSIS — D473 Essential (hemorrhagic) thrombocythemia: Secondary | ICD-10-CM

## 2014-12-09 DIAGNOSIS — Z0000 Encounter for general adult medical examination without abnormal findings: Secondary | ICD-10-CM | POA: Diagnosis not present

## 2014-12-09 LAB — LIPID PANEL
CHOLESTEROL: 149 mg/dL (ref 0–200)
HDL: 47.1 mg/dL (ref >39.00)
LDL Cholesterol: 83 mg/dL (ref 0–99)
NonHDL: 102.37
TRIGLYCERIDES: 95 mg/dL (ref 0.0–149.0)
Total CHOL/HDL Ratio: 3
VLDL: 19 mg/dL (ref 0.0–40.0)

## 2014-12-09 LAB — CBC WITH DIFFERENTIAL/PLATELET
Basophils Absolute: 0 10*3/uL (ref 0.0–0.1)
Basophils Relative: 0.6 % (ref 0.0–3.0)
EOS PCT: 2.1 % (ref 0.0–5.0)
Eosinophils Absolute: 0.1 10*3/uL (ref 0.0–0.7)
HCT: 48.2 % (ref 39.0–52.0)
HEMOGLOBIN: 16.3 g/dL (ref 13.0–17.0)
LYMPHS PCT: 17.9 % (ref 12.0–46.0)
Lymphs Abs: 1.2 10*3/uL (ref 0.7–4.0)
MCHC: 33.8 g/dL (ref 30.0–36.0)
MCV: 104.7 fl — ABNORMAL HIGH (ref 78.0–100.0)
Monocytes Absolute: 0.5 10*3/uL (ref 0.1–1.0)
Monocytes Relative: 7.6 % (ref 3.0–12.0)
Neutro Abs: 4.7 10*3/uL (ref 1.4–7.7)
Neutrophils Relative %: 71.8 % (ref 43.0–77.0)
PLATELETS: 452 10*3/uL — AB (ref 150.0–400.0)
RBC: 4.6 Mil/uL (ref 4.22–5.81)
RDW: 13.1 % (ref 11.5–15.5)
WBC: 6.5 10*3/uL (ref 4.0–10.5)

## 2014-12-09 LAB — COMPREHENSIVE METABOLIC PANEL
ALBUMIN: 4.4 g/dL (ref 3.5–5.2)
ALT: 16 U/L (ref 0–53)
AST: 16 U/L (ref 0–37)
Alkaline Phosphatase: 66 U/L (ref 39–117)
BUN: 18 mg/dL (ref 6–23)
CO2: 32 mEq/L (ref 19–32)
Calcium: 9.7 mg/dL (ref 8.4–10.5)
Chloride: 101 mEq/L (ref 96–112)
Creatinine, Ser: 1.11 mg/dL (ref 0.40–1.50)
GFR: 72.46 mL/min (ref >60.00)
Glucose, Bld: 78 mg/dL (ref 70–99)
POTASSIUM: 5 meq/L (ref 3.5–5.1)
Sodium: 140 mEq/L (ref 135–145)
Total Bilirubin: 0.7 mg/dL (ref 0.2–1.2)
Total Protein: 6.1 g/dL (ref 6.0–8.3)

## 2014-12-09 LAB — LACTATE DEHYDROGENASE: LDH: 195 U/L (ref 94–250)

## 2014-12-09 LAB — PSA: PSA: 0.41 ng/mL (ref 0.10–4.00)

## 2015-03-02 ENCOUNTER — Encounter: Payer: Self-pay | Admitting: Family Medicine

## 2015-03-03 ENCOUNTER — Other Ambulatory Visit: Payer: Self-pay | Admitting: Family Medicine

## 2015-03-03 DIAGNOSIS — N400 Enlarged prostate without lower urinary tract symptoms: Secondary | ICD-10-CM

## 2015-03-03 DIAGNOSIS — D473 Essential (hemorrhagic) thrombocythemia: Secondary | ICD-10-CM

## 2015-03-03 DIAGNOSIS — Z0000 Encounter for general adult medical examination without abnormal findings: Secondary | ICD-10-CM

## 2015-03-09 ENCOUNTER — Other Ambulatory Visit (INDEPENDENT_AMBULATORY_CARE_PROVIDER_SITE_OTHER): Payer: BLUE CROSS/BLUE SHIELD

## 2015-03-09 DIAGNOSIS — R5383 Other fatigue: Secondary | ICD-10-CM | POA: Diagnosis not present

## 2015-03-09 DIAGNOSIS — N400 Enlarged prostate without lower urinary tract symptoms: Secondary | ICD-10-CM | POA: Diagnosis not present

## 2015-03-09 DIAGNOSIS — Z0000 Encounter for general adult medical examination without abnormal findings: Secondary | ICD-10-CM

## 2015-03-09 LAB — CBC WITH DIFFERENTIAL/PLATELET
BASOS ABS: 0 10*3/uL (ref 0.0–0.1)
Basophils Relative: 0.6 % (ref 0.0–3.0)
EOS PCT: 1.4 % (ref 0.0–5.0)
Eosinophils Absolute: 0.1 10*3/uL (ref 0.0–0.7)
HCT: 47.8 % (ref 39.0–52.0)
HEMOGLOBIN: 16.1 g/dL (ref 13.0–17.0)
Lymphocytes Relative: 15.1 % (ref 12.0–46.0)
Lymphs Abs: 1.1 10*3/uL (ref 0.7–4.0)
MCHC: 33.8 g/dL (ref 30.0–36.0)
MCV: 104.8 fl — AB (ref 78.0–100.0)
MONO ABS: 0.6 10*3/uL (ref 0.1–1.0)
MONOS PCT: 7.9 % (ref 3.0–12.0)
Neutro Abs: 5.2 10*3/uL (ref 1.4–7.7)
Neutrophils Relative %: 75 % (ref 43.0–77.0)
Platelets: 484 10*3/uL — ABNORMAL HIGH (ref 150.0–400.0)
RBC: 4.57 Mil/uL (ref 4.22–5.81)
RDW: 13.2 % (ref 11.5–15.5)
WBC: 7 10*3/uL (ref 4.0–10.5)

## 2015-03-09 LAB — COMPREHENSIVE METABOLIC PANEL
ALBUMIN: 4.4 g/dL (ref 3.5–5.2)
ALK PHOS: 72 U/L (ref 39–117)
ALT: 16 U/L (ref 0–53)
AST: 18 U/L (ref 0–37)
BILIRUBIN TOTAL: 0.8 mg/dL (ref 0.2–1.2)
BUN: 16 mg/dL (ref 6–23)
CO2: 32 mEq/L (ref 19–32)
Calcium: 9.7 mg/dL (ref 8.4–10.5)
Chloride: 102 mEq/L (ref 96–112)
Creatinine, Ser: 1.02 mg/dL (ref 0.40–1.50)
GFR: 79.82 mL/min (ref >60.00)
Glucose, Bld: 86 mg/dL (ref 70–99)
POTASSIUM: 5 meq/L (ref 3.5–5.1)
Sodium: 140 mEq/L (ref 135–145)
TOTAL PROTEIN: 6.2 g/dL (ref 6.0–8.3)

## 2015-03-09 LAB — TESTOSTERONE: TESTOSTERONE: 211.68 ng/dL — AB (ref 300.00–890.00)

## 2015-03-09 LAB — LIPID PANEL
Cholesterol: 152 mg/dL (ref 0–200)
HDL: 47 mg/dL (ref >39.00)
LDL CALC: 82 mg/dL (ref 0–99)
NONHDL: 105.09
Total CHOL/HDL Ratio: 3
Triglycerides: 114 mg/dL (ref 0.0–149.0)
VLDL: 22.8 mg/dL (ref 0.0–40.0)

## 2015-03-09 LAB — PSA: PSA: 0.38 ng/mL (ref 0.10–4.00)

## 2015-03-09 LAB — LACTATE DEHYDROGENASE: LDH: 191 U/L (ref 94–250)

## 2015-03-13 ENCOUNTER — Telehealth: Payer: Self-pay | Admitting: *Deleted

## 2015-03-13 ENCOUNTER — Telehealth: Payer: Self-pay | Admitting: Internal Medicine

## 2015-03-13 ENCOUNTER — Telehealth: Payer: Self-pay | Admitting: Family Medicine

## 2015-03-13 NOTE — Telephone Encounter (Signed)
Patient called requesting f/u w/MM. R/s lab/fu from feb and gave patient appointment for 03/29/15. Patient forwarded to desk nurse to leave message re concerns about blood count and low testosterone and to see if he needs to be seen before 03/29/15.

## 2015-03-13 NOTE — Telephone Encounter (Signed)
No concern about platelet count of 484K. Will keep appointment as scheduled. I have no openings before that time.

## 2015-03-13 NOTE — Telephone Encounter (Signed)
Pt call wanting you to fax all recent blood done to Dr Curt Bears @ [facility]

## 2015-03-13 NOTE — Telephone Encounter (Signed)
Pt left a message that he would like an appt sooner than 03/29/15 to follow up with Dr Julien Nordmann.   Labs are being drawn at PCP. Platelets 484K, having increased discomfort in spleen area.   Dr Hartley Barefoot wants to start him on medication for low testosterone. This med can effect platelet count.  Wants advise from Dr Julien Nordmann.

## 2015-03-13 NOTE — Telephone Encounter (Signed)
Spoke to pt and advised him that Dr Lew Dawes office will automatically be able to see his labs through epic

## 2015-03-14 ENCOUNTER — Telehealth: Payer: Self-pay | Admitting: *Deleted

## 2015-03-14 NOTE — Telephone Encounter (Signed)
This RN returned call per review by MD with his ok to proceed with testosterone therapy per Dr Hartley Barefoot.  Pt had multiple concerns relating to the above due to medication recommended is " not testosterone but another medication which could increase my platelet count "  Other concern is raising plts, possible medication that could increase them further and planned knee surgery in April.  Per call pt was able to find name of medication recommended by Dr Hartley Barefoot as " clomiphene citrate ".  At present and per call including review of current appointment date of 03/29/2015 pt would prefer to wait to start any testosterone therapy until he has his consultation with Dr Julien Nordmann.  No other concerns at this time.  This note will be sent to Dr Julien Nordmann and Dr Binnie Kand for communication.

## 2015-03-15 ENCOUNTER — Encounter: Payer: Self-pay | Admitting: Family Medicine

## 2015-03-22 NOTE — Telephone Encounter (Signed)
No entry 

## 2015-03-24 ENCOUNTER — Telehealth: Payer: Self-pay | Admitting: *Deleted

## 2015-03-24 NOTE — Telephone Encounter (Signed)
Pt called with concern if need to keep Lab appt 1/4. Per MD pt to keep scheduled appt as his plt counts were elevated and pt had concerns about this. Instructed pt to keep appt. No further concerns.

## 2015-03-28 ENCOUNTER — Other Ambulatory Visit: Payer: Self-pay | Admitting: Internal Medicine

## 2015-03-28 DIAGNOSIS — D473 Essential (hemorrhagic) thrombocythemia: Secondary | ICD-10-CM

## 2015-03-29 ENCOUNTER — Telehealth: Payer: Self-pay | Admitting: Family Medicine

## 2015-03-29 ENCOUNTER — Emergency Department (HOSPITAL_BASED_OUTPATIENT_CLINIC_OR_DEPARTMENT_OTHER)
Admit: 2015-03-29 | Discharge: 2015-03-29 | Disposition: A | Discharge status: Home / Self Care | Payer: BLUE CROSS/BLUE SHIELD | Attending: Emergency Medicine | Admitting: Emergency Medicine

## 2015-03-29 ENCOUNTER — Other Ambulatory Visit: Payer: Self-pay | Admitting: Medical Oncology

## 2015-03-29 ENCOUNTER — Encounter (HOSPITAL_COMMUNITY): Payer: Self-pay | Admitting: Family Medicine

## 2015-03-29 ENCOUNTER — Ambulatory Visit (HOSPITAL_BASED_OUTPATIENT_CLINIC_OR_DEPARTMENT_OTHER): Payer: BLUE CROSS/BLUE SHIELD | Admitting: Internal Medicine

## 2015-03-29 ENCOUNTER — Emergency Department (HOSPITAL_COMMUNITY)
Admission: EM | Admit: 2015-03-29 | Discharge: 2015-03-29 | Disposition: A | Discharge status: Home / Self Care | Payer: BLUE CROSS/BLUE SHIELD | Attending: Physician Assistant | Admitting: Physician Assistant

## 2015-03-29 ENCOUNTER — Other Ambulatory Visit (HOSPITAL_BASED_OUTPATIENT_CLINIC_OR_DEPARTMENT_OTHER): Payer: BLUE CROSS/BLUE SHIELD

## 2015-03-29 ENCOUNTER — Encounter: Payer: Self-pay | Admitting: Internal Medicine

## 2015-03-29 VITALS — BP 131/79 | HR 92 | Temp 97.9°F | Resp 18 | Ht 72.0 in | Wt 187.9 lb

## 2015-03-29 DIAGNOSIS — Z86718 Personal history of other venous thrombosis and embolism: Secondary | ICD-10-CM | POA: Insufficient documentation

## 2015-03-29 DIAGNOSIS — M79609 Pain in unspecified limb: Secondary | ICD-10-CM | POA: Diagnosis not present

## 2015-03-29 DIAGNOSIS — Z862 Personal history of diseases of the blood and blood-forming organs and certain disorders involving the immune mechanism: Secondary | ICD-10-CM | POA: Insufficient documentation

## 2015-03-29 DIAGNOSIS — N400 Enlarged prostate without lower urinary tract symptoms: Secondary | ICD-10-CM | POA: Insufficient documentation

## 2015-03-29 DIAGNOSIS — M79662 Pain in left lower leg: Secondary | ICD-10-CM | POA: Diagnosis not present

## 2015-03-29 DIAGNOSIS — R5383 Other fatigue: Secondary | ICD-10-CM

## 2015-03-29 DIAGNOSIS — Z7952 Long term (current) use of systemic steroids: Secondary | ICD-10-CM | POA: Insufficient documentation

## 2015-03-29 DIAGNOSIS — M79605 Pain in left leg: Secondary | ICD-10-CM

## 2015-03-29 DIAGNOSIS — Z79899 Other long term (current) drug therapy: Secondary | ICD-10-CM | POA: Diagnosis not present

## 2015-03-29 DIAGNOSIS — Z7982 Long term (current) use of aspirin: Secondary | ICD-10-CM | POA: Diagnosis not present

## 2015-03-29 DIAGNOSIS — Z87891 Personal history of nicotine dependence: Secondary | ICD-10-CM | POA: Diagnosis not present

## 2015-03-29 DIAGNOSIS — D473 Essential (hemorrhagic) thrombocythemia: Secondary | ICD-10-CM

## 2015-03-29 DIAGNOSIS — I10 Essential (primary) hypertension: Secondary | ICD-10-CM | POA: Diagnosis not present

## 2015-03-29 DIAGNOSIS — R161 Splenomegaly, not elsewhere classified: Secondary | ICD-10-CM | POA: Insufficient documentation

## 2015-03-29 LAB — CBC WITH DIFFERENTIAL/PLATELET
BASO%: 0.8 % (ref 0.0–2.0)
Basophils Absolute: 0.1 10*3/uL (ref 0.0–0.1)
EOS ABS: 0.1 10*3/uL (ref 0.0–0.5)
EOS%: 1.1 % (ref 0.0–7.0)
HCT: 48.8 % (ref 38.4–49.9)
HEMOGLOBIN: 16.6 g/dL (ref 13.0–17.1)
LYMPH%: 14.1 % (ref 14.0–49.0)
MCH: 35.5 pg — ABNORMAL HIGH (ref 27.2–33.4)
MCHC: 34 g/dL (ref 32.0–36.0)
MCV: 104.3 fL — ABNORMAL HIGH (ref 79.3–98.0)
MONO#: 0.6 10*3/uL (ref 0.1–0.9)
MONO%: 7.7 % (ref 0.0–14.0)
NEUT%: 76.3 % — ABNORMAL HIGH (ref 39.0–75.0)
NEUTROS ABS: 6.5 10*3/uL (ref 1.5–6.5)
Platelets: 445 10*3/uL — ABNORMAL HIGH (ref 140–400)
RBC: 4.69 10*6/uL (ref 4.20–5.82)
RDW: 13.1 % (ref 11.0–14.6)
WBC: 8.4 10*3/uL (ref 4.0–10.3)
lymph#: 1.2 10*3/uL (ref 0.9–3.3)

## 2015-03-29 NOTE — Progress Notes (Signed)
*  PRELIMINARY RESULTS* Vascular Ultrasound Left lower extremity venous duplex has been completed.  Preliminary findings: No evidence of DVT or baker's cyst.   Landry Mellow, RDMS, RVT  03/29/2015, 1:51 PM

## 2015-03-29 NOTE — ED Notes (Signed)
Pt in vascular.

## 2015-03-29 NOTE — Telephone Encounter (Signed)
Patient Name: Dennis Martin  DOB: Nov 18, 1955    Initial Comment Caller states he's having lower calf pain. Enlarge spleen.   Nurse Assessment  Nurse: Leilani Merl, RN, Heather Date/Time (Eastern Time): 03/29/2015 8:16:15 AM  Confirm and document reason for call. If symptomatic, describe symptoms. ---Caller states that he has been having left calf pain for the last several weeks and abd pain in his spleen area for the last 6 weeks.  Has the patient traveled out of the country within the last 30 days? ---Not Applicable  Does the patient have any new or worsening symptoms? ---Yes  Will a triage be completed? ---Yes  Related visit to physician within the last 2 weeks? ---No  Does the PT have any chronic conditions? (i.e. diabetes, asthma, etc.) ---Yes  List chronic conditions. ---bleeding disorder, enlarged spleen  Is this a behavioral health or substance abuse call? ---No     Guidelines    Guideline Title Affirmed Question Affirmed Notes  Leg Pain [1] Thigh or calf pain AND [2] only 1 side AND [3] present > 1 hour    Final Disposition User   See Physician within 4 Hours (or PCP triage) Standifer, RN, Ashley states that since there are no appts at the office for this morning, he would like an ultrasound ordered for his left calf and his left abd area where his spleen is. He feels that is what he is going to need. He would like to have the ultrasound at [place] preferably and if not there then [facility]. Please call him and let him know what will be done.   Referrals  GO TO FACILITY REFUSED   Disagree/Comply: Disagree  Disagree/Comply Reason: Disagree with instructions

## 2015-03-29 NOTE — ED Notes (Signed)
Pt here for LLE tenderness. sts hx of DVT in the other leg.

## 2015-03-29 NOTE — ED Provider Notes (Signed)
CSN: [id number]     Arrival date & time 03/29/15  1044 History  By signing my name below, I, Starleen Arms, attest that this documentation has been prepared under the direction and in the presence of Wendie Simmer, PA-C. Electronically Signed: Starleen Arms ED Scribe. 03/29/2015. 2:21 PM.    Chief Complaint  Patient presents with  . Leg Pain   The history is provided by the patient. No language interpreter was used.   HPI Comments: Dennis Martin is a 60 y.o. male with hx of post-operative DVT who presents to the Emergency Department complaining of constant, unchanged left lateral calf pain onset several weeks ago without injury.  The pain is worse when he lays down at night, 4/10 pain scale, intermittent, achy, non-radiating. He thinks it is similar to previous DVT pain.  No fevers, chills, chest pain, SOB, inability to bear weight.  Past Medical History  Diagnosis Date  . BPH (benign prostatic hyperplasia)   . Hypertension   . Essential thrombocytosis [facility])    Past Surgical History  Procedure Laterality Date  . Ulnar nerve transposition  2007    left  . Knee surgery     Family History  Problem Relation Age of Onset  . Stomach cancer Neg Hx   . Colon cancer Paternal Aunt 71   Social History  Substance Use Topics  . Smoking status: Former Smoker    Quit date: 05/06/2003  . Smokeless tobacco: Never Used     Comment: Quit 2005  . Alcohol Use: 1.2 oz/week    2 Cans of beer per week     Comment: occ    Review of Systems 10 Systems reviewed and all are negative for acute change except as noted in the HPI.  Allergies  Keflex  Home Medications   Prior to Admission medications   Medication Sig Start Date End Date Taking? Authorizing Provider  aspirin 81 MG tablet Take 81 mg by mouth daily.    Historical Provider, MD  cetaphil (CETAPHIL) cream Apply topically as needed.    Historical Provider, MD  desonide (DESOWEN) 0.05 % cream Apply topically 2 (two) times daily. 02/10/12    Lucille Passy, MD  doxazosin (CARDURA) 1 MG tablet TAKE 1 TABLET AT BEDTIME 11/23/14   Lucille Passy, MD  FIBER SELECT GUMMIES PO Pt chews 2 Phillips fiber good gummies daily.    Historical Provider, MD  folic acid (FOLVITE) A999333 MCG tablet Take 400 mcg by mouth daily.    Historical Provider, MD  hydrocortisone 2.5 % lotion Apply topically daily.    Historical Provider, MD  hydroxyurea (HYDREA) 500 MG capsule TAKE 1 CAPSULE DAILY, MAY  TAKE WITH FOOD TO MINIMIZE GASTROINTESTINAL SIDE      EFFECTS 09/05/14   Lucille Passy, MD  ketoconazole (NIZORAL) 2 % cream Apply 1 application topically daily.    Historical Provider, MD  ketoconazole (NIZORAL) 2 % shampoo Apply topically 2 (two) times a week. 02/10/12   Lucille Passy, MD  lisinopril (PRINIVIL,ZESTRIL) 10 MG tablet TAKE 1 TABLET DAILY 11/23/14   Lucille Passy, MD  loratadine (CLARITIN) 10 MG tablet Take 10 mg by mouth daily.    Historical Provider, MD  naproxen (NAPROSYN) 500 MG tablet TAKE 1 TABLET TWICE A DAY  WITH MEALS as needed 08/03/12   Lucille Passy, MD  OVER THE COUNTER MEDICATION Vitafusion men's complete vitamin gummies; pt chews two gummies daily.    Historical Provider, MD  triamcinolone cream (KENALOG) 0.1 %  04/29/13   Historical Provider, MD   BP 136/74 mmHg  Pulse 78  Temp(Src) 97.6 F (36.4 C) (Oral)  Resp 18  Ht 6' (1.829 m)  Wt 186 lb (84.369 kg)  BMI 25.22 kg/m2  SpO2 97% Physical Exam  Constitutional: He is oriented to person, place, and time. He appears well-developed and well-nourished. No distress.  HENT:  Head: Normocephalic and atraumatic.  Mouth/Throat: Oropharynx is clear and moist. No oropharyngeal exudate.  Eyes: Conjunctivae are normal. Right eye exhibits no discharge. Left eye exhibits no discharge. No scleral icterus.  Neck: Neck supple. No tracheal deviation present.  Cardiovascular: Normal rate, regular rhythm, normal heart sounds and intact distal pulses.  Exam reveals no gallop and no friction rub.   No murmur  heard. Pulmonary/Chest: Effort normal and breath sounds normal. No respiratory distress. He has no wheezes. He has no rales. He exhibits no tenderness.  Abdominal: Soft. Bowel sounds are normal. He exhibits no distension and no mass. There is no tenderness. There is no rebound and no guarding.  Musculoskeletal: Normal range of motion. He exhibits tenderness. He exhibits no edema.  Left gastrocnemius tenderness. Neurovascularly intact BL.   Lymphadenopathy:    He has no cervical adenopathy.  Neurological: He is alert and oriented to person, place, and time. Coordination normal.  Skin: Skin is warm and dry. No rash noted. He is not diaphoretic. No erythema.  Psychiatric: He has a normal mood and affect. His behavior is normal.  Nursing note and vitals reviewed.   ED Course  Procedures   DIAGNOSTIC STUDIES: Oxygen Saturation is 97% on RA, normal by my interpretation.    COORDINATION OF CARE:  2:21 PM Discussed treatment plan with patient at bedside.  Patient acknowledges and agrees with plan.    MDM   Final diagnoses:  Pain of left lower extremity   Patient non-toxic appearing and VSS. Based on patient history and physical exam, most likely etiologies are vascular vs electrolyte abnormalities. Less likely etiologies include cardiac, infection, trauma, neurologic. No evidence of DVT or baker's cyst per preliminary vascular report. I discussed results with patient and offered to order a BMP to evaluate for electrolyte disturbances but patient declined, as he has a doctor's appointment later today. He also declined pain medication. Patient may be safely discharged home. Discussed reasons for return. Patient to follow-up with primary care provider later today. Patient in understanding and agreement with the plan.  I personally performed the services described in this documentation, which was scribed in my presence. The recorded information has been reviewed and is accurate.    [place] Lions, PA-C 04/01/15 [place], MD 04/02/15 [phone]

## 2015-03-29 NOTE — Telephone Encounter (Signed)
Agree, eval is needed. ER if no appts today.

## 2015-03-29 NOTE — Progress Notes (Signed)
[place] [phone]   Fax:([phone]) [place], MD [place] [address]  DIAGNOSIS: Essential thrombocythemia diagnosed in 2006   PRIOR THERAPY: None   CURRENT THERAPY: Hydroxyurea 500 mg by mouth daily except Friday he is on 1000 mg  INTERVAL HISTORY: Dennis Martin 60 y.o. male returns to the clinic today for followup visit accompanied by his wife. He was last seen in August 2015. The patient has been on treatment with hydroxyurea and tolerating it fairly well. He was seen recently by his urologist Dr. Gaynelle Arabian consider him for treatment with clomiphene citrate. The patient was concerned about the treatment and its effect on his platelets count. He was also noted a recent bloodwork to have elevated platelets count of 484,000. He requested follow-up visit for reevaluation and discussion of his condition. The patient also noticed some fullness in the left upper quadrant questionable for enlargement of his spleen. He has known history of splenomegaly but his last ultrasound of the abdomen was performed at [facility] more than 10 years ago. He denied having any other significant complaints except for mild fatigue. He has no nausea or vomiting, no fever or chills. The patient denied having any bleeding issues. He has no chest pain, shortness of breath, cough or hemoptysis. He had repeat CBC and iron study earlier today and he is here for evaluation and discussion of his lab results.  MEDICAL HISTORY: Past Medical History  Diagnosis Date  . BPH (benign prostatic hyperplasia)   . Hypertension   . Essential thrombocytosis (HCC)     ALLERGIES:  is allergic to keflex.  MEDICATIONS:  Current Outpatient Prescriptions  Medication Sig Dispense Refill  . aspirin 81 MG tablet Take 81 mg by mouth daily.    . cetaphil (CETAPHIL) cream Apply topically as needed.    . desonide (DESOWEN) 0.05 % cream Apply  topically 2 (two) times daily. 30 g 3  . doxazosin (CARDURA) 1 MG tablet TAKE 1 TABLET AT BEDTIME 90 tablet 3  . folic acid (FOLVITE) A999333 MCG tablet Take 400 mcg by mouth daily.    . hydrocortisone 2.5 % lotion Apply topically daily.    . hydroxyurea (HYDREA) 500 MG capsule TAKE 1 CAPSULE DAILY, MAY  TAKE WITH FOOD TO MINIMIZE GASTROINTESTINAL SIDE      EFFECTS 90 capsule 3  . ketoconazole (NIZORAL) 2 % cream Apply 1 application topically daily.    Marland Kitchen ketoconazole (NIZORAL) 2 % shampoo Apply topically 2 (two) times a week. 120 mL 3  . lisinopril (PRINIVIL,ZESTRIL) 10 MG tablet TAKE 1 TABLET DAILY 90 tablet 3  . OVER THE COUNTER MEDICATION Vitafusion men's complete vitamin gummies; pt chews two gummies daily.    Marland Kitchen triamcinolone cream (KENALOG) 0.1 %      No current facility-administered medications for this visit.    SURGICAL HISTORY:  Past Surgical History  Procedure Laterality Date  . Ulnar nerve transposition  2007    left  . Knee surgery      REVIEW OF SYSTEMS:  A comprehensive review of systems was negative except for: Constitutional: positive for fatigue   PHYSICAL EXAMINATION: General appearance: alert, cooperative, fatigued and no distress Head: Normocephalic, without obvious abnormality, atraumatic Neck: no adenopathy Lymph nodes: Cervical, supraclavicular, and axillary nodes normal. Resp: clear to auscultation bilaterally Cardio: regular rate and rhythm, S1, S2 normal, no murmur, click, rub or gallop GI: abnormal findings:  splenomegaly Extremities: extremities  normal, atraumatic, no cyanosis or edema  ECOG PERFORMANCE STATUS: 0 - Asymptomatic  Blood pressure 131/79, pulse 92, temperature 97.9 F (36.6 C), temperature source Oral, resp. rate 18, height 6' (1.829 m), weight 187 lb 14.4 oz (85.231 kg), SpO2 97 %.  LABORATORY DATA: Lab Results  Component Value Date   WBC 8.4 03/29/2015   HGB 16.6 03/29/2015   HCT 48.8 03/29/2015   MCV 104.3* 03/29/2015   PLT 445*  03/29/2015      Chemistry      Component Value Date/Time   NA 140 03/09/2015 0823   NA 139 11/10/2013 1508   K 5.0 03/09/2015 0823   K 4.3 11/10/2013 1508   CL 102 03/09/2015 0823   CL 102 05/04/2012 1309   CO2 32 03/09/2015 0823   CO2 28 11/10/2013 1508   BUN 16 03/09/2015 0823   BUN 15.4 11/10/2013 1508   CREATININE 1.02 03/09/2015 0823   CREATININE 1.1 11/10/2013 1508      Component Value Date/Time   CALCIUM 9.7 03/09/2015 0823   CALCIUM 9.6 11/10/2013 1508   ALKPHOS 72 03/09/2015 0823   ALKPHOS 78 11/10/2013 1508   AST 18 03/09/2015 0823   AST 19 11/10/2013 1508   ALT 16 03/09/2015 0823   ALT 15 11/10/2013 1508   BILITOT 0.8 03/09/2015 0823   BILITOT 0.43 11/10/2013 1508       RADIOGRAPHIC STUDIES: No results found.  ASSESSMENT AND PLAN: This is a very pleasant 60 years old white male with essential thrombocythemia currently on treatment with hydroxyurea 500 mg by mouth daily except Friday where the patient takes 1000 mg. He is tolerating his treatment fairly well and there is no significant evidence for disease progression on his recent blood count. His platelets count today is down to 445,000. I assured the patient that he is currently on the right dose of hydroxyurea and it is okay for him to start treatment with clomiphene citrate if needed and I will be happy to adjust his dose of hydroxyurea if he has any significant increase in his platelets count. He will also continue his treatment with aspirin 81 mg by mouth daily I recommended for him to continue on hydroxyurea with the current dose.  He continue to take folic acid over-the-counter 1-2 tablets every day for the macrocytosis resulting from the hydroxyurea treatment. For the questionable splenomegaly, I will order ultrasound of the abdomen to evaluate his spleen size. The patient would like to continue on as needed follow-up visit. The patient voices understanding of current disease status and treatment  options and is in agreement with the current care plan.  All questions were answered. The patient knows to call the clinic with any problems, questions or concerns. We can certainly see the patient much sooner if necessary.  Disclaimer: This note was dictated with voice recognition software. Similar sounding words can inadvertently be transcribed and may be missed upon review.

## 2015-03-29 NOTE — Discharge Instructions (Signed)
Mr. Dennis Martin,  Nice meeting you! Please follow-up with your primary care provider. Return to the emergency department if you develop shortness of breath, chest pain, increasing leg pain, color changes in your legs/feet. Feel better soon!  S. Wendie Simmer, PA-C   Musculoskeletal Pain Musculoskeletal pain is muscle and boney aches and pains. These pains can occur in any part of the body. Your caregiver may treat you without knowing the cause of the pain. They may treat you if blood or urine tests, X-rays, and other tests were normal.  CAUSES There is often not a definite cause or reason for these pains. These pains may be caused by a type of germ (virus). The discomfort may also come from overuse. Overuse includes working out too hard when your body is not fit. Boney aches also come from weather changes. Bone is sensitive to atmospheric pressure changes. HOME CARE INSTRUCTIONS   Ask when your test results will be ready. Make sure you get your test results.  Only take over-the-counter or prescription medicines for pain, discomfort, or fever as directed by your caregiver. If you were given medications for your condition, do not drive, operate machinery or power tools, or sign legal documents for 24 hours. Do not drink alcohol. Do not take sleeping pills or other medications that may interfere with treatment.  Continue all activities unless the activities cause more pain. When the pain lessens, slowly resume normal activities. Gradually increase the intensity and duration of the activities or exercise.  During periods of severe pain, bed rest may be helpful. Lay or sit in any position that is comfortable.  Putting ice on the injured area.  Put ice in a bag.  Place a towel between your skin and the bag.  Leave the ice on for 15 to 20 minutes, 3 to 4 times a day.  Follow up with your caregiver for continued problems and no reason can be found for the pain. If the pain becomes worse or does  not go away, it may be necessary to repeat tests or do additional testing. Your caregiver may need to look further for a possible cause. SEEK IMMEDIATE MEDICAL CARE IF:  You have pain that is getting worse and is not relieved by medications.  You develop chest pain that is associated with shortness or breath, sweating, feeling sick to your stomach (nauseous), or throw up (vomit).  Your pain becomes localized to the abdomen.  You develop any new symptoms that seem different or that concern you. MAKE SURE YOU:   Understand these instructions.  Will watch your condition.  Will get help right away if you are not doing well or get worse.   This information is not intended to replace advice given to you by your health care provider. Make sure you discuss any questions you have with your health care provider.   Document Released: 03/11/2005 Document Revised: 06/03/2011 Document Reviewed: 11/13/2012 Elsevier Interactive Patient Education 2016 [place] therapy can help ease sore, stiff, injured, and tight muscles and joints. Heat relaxes your muscles, which may help ease your pain.  RISKS AND COMPLICATIONS If you have any of the following conditions, do not use heat therapy unless your health care provider has approved:  Poor circulation.  Healing wounds or scarred skin in the area being treated.  Diabetes, heart disease, or high blood pressure.  Not being able to feel (numbness) the area being treated.  Unusual swelling of the area being treated.  Active  infections.  Blood clots.  Cancer.  Inability to communicate pain. This may include young children and people who have problems with their brain function (dementia).  Pregnancy. Heat therapy should only be used on old, pre-existing, or long-lasting (chronic) injuries. Do not use heat therapy on new injuries unless directed by your health care provider. HOW TO USE HEAT THERAPY There are several  different kinds of heat therapy, including:  Moist heat pack.  Warm water bath.  Hot water bottle.  Electric heating pad.  Heated gel pack.  Heated wrap.  Electric heating pad. Use the heat therapy method suggested by your health care provider. Follow your health care provider's instructions on when and how to use heat therapy. GENERAL HEAT THERAPY RECOMMENDATIONS  Do not sleep while using heat therapy. Only use heat therapy while you are awake.  Your skin may turn pink while using heat therapy. Do not use heat therapy if your skin turns red.  Do not use heat therapy if you have new pain.  High heat or long exposure to heat can cause burns. Be careful when using heat therapy to avoid burning your skin.  Do not use heat therapy on areas of your skin that are already irritated, such as with a rash or sunburn. SEEK MEDICAL CARE IF:  You have blisters, redness, swelling, or numbness.  You have new pain.  Your pain is worse. MAKE SURE YOU:  Understand these instructions.  Will watch your condition.  Will get help right away if you are not doing well or get worse.   This information is not intended to replace advice given to you by your health care provider. Make sure you discuss any questions you have with your health care provider.   Document Released: 06/03/2011 Document Revised: 04/01/2014 Document Reviewed: 05/04/2013 Elsevier Interactive Patient Education [employer].

## 2015-03-29 NOTE — Telephone Encounter (Signed)
Spoke with pt and advised pt would need to go to ED to be evaluated this morning for leg and abd pain. Pt said he just wants [place] scheduled; advised would need to be seen first to determine what testing is needed. Pt will go to [facility] ED.

## 2015-03-30 LAB — IRON AND TIBC
%SAT: 22 % (ref 20–55)
IRON: 73 ug/dL (ref 42–163)
TIBC: 330 ug/dL (ref 202–409)
UIBC: 257 ug/dL (ref 117–376)

## 2015-03-30 LAB — FERRITIN: Ferritin: 76 ng/ml (ref 22–316)

## 2015-04-03 ENCOUNTER — Telehealth: Payer: Self-pay | Admitting: Internal Medicine

## 2015-04-03 NOTE — Telephone Encounter (Signed)
SPOKE WITH PATIENT RE [place] 1/10 @ 10:45 ARIVE 10:25 AT [facility]. NO OTHER ORDERS PER 1/4 POF.

## 2015-04-04 ENCOUNTER — Ambulatory Visit
Admission: RE | Admit: 2015-04-04 | Discharge: 2015-04-04 | Disposition: A | Discharge status: Home / Self Care | Payer: BLUE CROSS/BLUE SHIELD | Source: Ambulatory Visit | Attending: Internal Medicine | Admitting: Internal Medicine

## 2015-04-04 DIAGNOSIS — D473 Essential (hemorrhagic) thrombocythemia: Secondary | ICD-10-CM

## 2015-04-05 ENCOUNTER — Encounter: Payer: Self-pay | Admitting: Internal Medicine

## 2015-04-21 ENCOUNTER — Telehealth: Payer: Self-pay | Admitting: *Deleted

## 2015-04-21 NOTE — Telephone Encounter (Signed)
Returned call to pt regarding concern via email for recent blood work/iron studies.After review with MD, informed pt at last office visit pt has noticed left abdominal enlargement/area of concern. Iron studies and [place] of spleen ordered.  Informed pt per MD, no concerns with the [place] and iron studies. Pt verbalized understanding.

## 2015-04-24 ENCOUNTER — Other Ambulatory Visit: Payer: Self-pay | Admitting: Orthopedic Surgery

## 2015-04-24 ENCOUNTER — Other Ambulatory Visit: Payer: Self-pay | Admitting: Family Medicine

## 2015-04-24 DIAGNOSIS — I10 Essential (primary) hypertension: Secondary | ICD-10-CM

## 2015-04-24 DIAGNOSIS — D473 Essential (hemorrhagic) thrombocythemia: Secondary | ICD-10-CM

## 2015-04-24 DIAGNOSIS — E875 Hyperkalemia: Secondary | ICD-10-CM

## 2015-04-24 DIAGNOSIS — N400 Enlarged prostate without lower urinary tract symptoms: Secondary | ICD-10-CM

## 2015-04-24 DIAGNOSIS — Z0000 Encounter for general adult medical examination without abnormal findings: Secondary | ICD-10-CM

## 2015-05-01 ENCOUNTER — Other Ambulatory Visit (INDEPENDENT_AMBULATORY_CARE_PROVIDER_SITE_OTHER): Payer: BLUE CROSS/BLUE SHIELD

## 2015-05-01 DIAGNOSIS — E291 Testicular hypofunction: Secondary | ICD-10-CM | POA: Diagnosis not present

## 2015-05-01 DIAGNOSIS — D473 Essential (hemorrhagic) thrombocythemia: Secondary | ICD-10-CM

## 2015-05-01 DIAGNOSIS — Z0000 Encounter for general adult medical examination without abnormal findings: Secondary | ICD-10-CM | POA: Diagnosis not present

## 2015-05-01 LAB — LIPID PANEL
CHOLESTEROL: 160 mg/dL (ref 0–200)
HDL: 46.5 mg/dL (ref >39.00)
LDL Cholesterol: 99 mg/dL (ref 0–99)
NONHDL: 113.54
TRIGLYCERIDES: 75 mg/dL (ref 0.0–149.0)
Total CHOL/HDL Ratio: 3
VLDL: 15 mg/dL (ref 0.0–40.0)

## 2015-05-01 LAB — CBC WITH DIFFERENTIAL/PLATELET
Basophils Absolute: 0 10*3/uL (ref 0.0–0.1)
Basophils Relative: 0.5 % (ref 0.0–3.0)
EOS PCT: 0.6 % (ref 0.0–5.0)
Eosinophils Absolute: 0 10*3/uL (ref 0.0–0.7)
HEMATOCRIT: 51.7 % (ref 39.0–52.0)
Hemoglobin: 17.2 g/dL — ABNORMAL HIGH (ref 13.0–17.0)
LYMPHS ABS: 0.7 10*3/uL (ref 0.7–4.0)
Lymphocytes Relative: 8.8 % — ABNORMAL LOW (ref 12.0–46.0)
MCHC: 33.2 g/dL (ref 30.0–36.0)
MCV: 105.7 fl — AB (ref 78.0–100.0)
MONOS PCT: 7.1 % (ref 3.0–12.0)
Monocytes Absolute: 0.5 10*3/uL (ref 0.1–1.0)
NEUTROS ABS: 6.2 10*3/uL (ref 1.4–7.7)
NEUTROS PCT: 83 % — AB (ref 43.0–77.0)
Platelets: 434 10*3/uL — ABNORMAL HIGH (ref 150.0–400.0)
RBC: 4.89 Mil/uL (ref 4.22–5.81)
RDW: 13.3 % (ref 11.5–15.5)
WBC: 7.5 10*3/uL (ref 4.0–10.5)

## 2015-05-01 LAB — COMPREHENSIVE METABOLIC PANEL
ALK PHOS: 56 U/L (ref 39–117)
ALT: 17 U/L (ref 0–53)
AST: 22 U/L (ref 0–37)
Albumin: 4.7 g/dL (ref 3.5–5.2)
BUN: 16 mg/dL (ref 6–23)
CALCIUM: 10 mg/dL (ref 8.4–10.5)
CO2: 30 mEq/L (ref 19–32)
Chloride: 102 mEq/L (ref 96–112)
Creatinine, Ser: 1.1 mg/dL (ref 0.40–1.50)
GFR: 73.12 mL/min (ref >60.00)
GLUCOSE: 99 mg/dL (ref 70–99)
POTASSIUM: 4.6 meq/L (ref 3.5–5.1)
Sodium: 141 mEq/L (ref 135–145)
TOTAL PROTEIN: 6.7 g/dL (ref 6.0–8.3)
Total Bilirubin: 0.9 mg/dL (ref 0.2–1.2)

## 2015-05-01 LAB — LACTATE DEHYDROGENASE: LDH: 224 U/L (ref 94–250)

## 2015-05-01 LAB — PSA: PSA: 0.67 ng/mL (ref 0.10–4.00)

## 2015-05-01 LAB — TESTOSTERONE: TESTOSTERONE: 623.16 ng/dL (ref 300.00–890.00)

## 2015-05-04 ENCOUNTER — Encounter: Payer: Self-pay | Admitting: Family Medicine

## 2015-05-08 ENCOUNTER — Ambulatory Visit (INDEPENDENT_AMBULATORY_CARE_PROVIDER_SITE_OTHER): Payer: BLUE CROSS/BLUE SHIELD | Admitting: Family Medicine

## 2015-05-08 ENCOUNTER — Encounter: Payer: Self-pay | Admitting: Family Medicine

## 2015-05-08 VITALS — BP 120/60 | HR 78 | Temp 97.6°F | Ht 71.0 in | Wt 182.5 lb

## 2015-05-08 DIAGNOSIS — N529 Male erectile dysfunction, unspecified: Secondary | ICD-10-CM | POA: Insufficient documentation

## 2015-05-08 DIAGNOSIS — D473 Essential (hemorrhagic) thrombocythemia: Secondary | ICD-10-CM

## 2015-05-08 DIAGNOSIS — N400 Enlarged prostate without lower urinary tract symptoms: Secondary | ICD-10-CM

## 2015-05-08 DIAGNOSIS — R7989 Other specified abnormal findings of blood chemistry: Secondary | ICD-10-CM | POA: Insufficient documentation

## 2015-05-08 DIAGNOSIS — Z0000 Encounter for general adult medical examination without abnormal findings: Secondary | ICD-10-CM

## 2015-05-08 DIAGNOSIS — I10 Essential (primary) hypertension: Secondary | ICD-10-CM | POA: Diagnosis not present

## 2015-05-08 DIAGNOSIS — R161 Splenomegaly, not elsewhere classified: Secondary | ICD-10-CM

## 2015-05-08 DIAGNOSIS — E291 Testicular hypofunction: Secondary | ICD-10-CM

## 2015-05-08 MED ORDER — SILDENAFIL CITRATE 100 MG PO TABS: 50.0000 mg | ORAL_TABLET | Freq: Every day | ORAL | Status: DC | PRN

## 2015-05-08 NOTE — Progress Notes (Signed)
Pre visit review using our clinic review tool, if applicable. No additional management support is needed unless otherwise documented below in the visit note. 

## 2015-05-08 NOTE — Assessment & Plan Note (Signed)
New- treat with viagra. Call or return to clinic prn if these symptoms worsen or fail to improve as anticipated.

## 2015-05-08 NOTE — Assessment & Plan Note (Signed)
Mild splenomegaly on [place].

## 2015-05-08 NOTE — Progress Notes (Signed)
Subjective:    Patient ID: Dennis Martin, male    DOB: 1956/03/25, 60 y.o.   MRN: [id number]  HPI  Pleasant 60 yo male here for CPX.  Colonoscopy 05/25/12 (pyrtle)- recall in 10 years. Flu vaccine 12/09/14 tdap 04/07/13 Zoster 02/09/13  Low testosterone- followed by Dr. Gaynelle Arabian. He is prescribing clomid for this.  Energy and sex drive have improved but now having more difficulty achieving and maintaining an erection.  Essential Thrombocytosis- diagnosed in 2006. Takes hydroxyurea. CBC has been stable. Followed by hematology (Dr. Earlie Server). Last saw him on 03/29/15- note reviewed.    ?splenomegaly- [place] ordered- mild splenomegaly without any acute abnormalities noted.  Having some abdominal pain with this. Lab Results  Component Value Date   WBC 7.5 05/01/2015   HGB 17.2* 05/01/2015   HCT 51.7 05/01/2015   MCV 105.7* 05/01/2015   PLT 434.0* 05/01/2015    HTN- on lisinopril 10 mg daily. Denies any HA, blurred vision, dizziness with standing, LE edema, CP or SOB. Lab Results  Component Value Date   CREATININE 1.10 05/01/2015    BPH- has been on cardura 1 mg for years.    Does sometimes feel like he is not emptying his bladder well but this is unchanged.  Lab Results  Component Value Date   PSA 0.67 05/01/2015   PSA 0.38 03/09/2015   PSA 0.41 12/09/2014    Lab Results  Component Value Date   CHOL 160 05/01/2015   HDL 46.50 05/01/2015   LDLCALC 99 05/01/2015   TRIG 75.0 05/01/2015   CHOLHDL 3 05/01/2015     Patient Active Problem List   Diagnosis Date Noted  . Low testosterone 05/08/2015  . Splenomegaly 03/29/2015  . Hyperkalemia 05/03/2014  . Routine general medical examination at a health care facility 04/14/2012  . Essential thrombocytosis ([place]) 08/12/2011  . HTN (hypertension) 08/12/2011  . BPH (benign prostatic hyperplasia) 08/12/2011   Past Medical History  Diagnosis Date  . BPH (benign prostatic hyperplasia)   . Hypertension   . Essential thrombocytosis  [facility])    Past Surgical History  Procedure Laterality Date  . Ulnar nerve transposition  2007    left  . Knee surgery     Social History  Substance Use Topics  . Smoking status: Former Smoker    Quit date: 05/06/2003  . Smokeless tobacco: Never Used     Comment: Quit 2005  . Alcohol Use: 1.2 oz/week    2 Cans of beer per week     Comment: occ   Family History  Problem Relation Age of Onset  . Stomach cancer Neg Hx   . Colon cancer Paternal Aunt 40   Allergies  Allergen Reactions  . Keflex [Cephalexin]     rash   Current Outpatient Prescriptions on File Prior to Visit  Medication Sig Dispense Refill  . aspirin 81 MG tablet Take 81 mg by mouth daily.    . cetaphil (CETAPHIL) cream Apply topically as needed.    . desonide (DESOWEN) 0.05 % cream Apply topically 2 (two) times daily. 30 g 3  . doxazosin (CARDURA) 1 MG tablet TAKE 1 TABLET AT BEDTIME 90 tablet 3  . folic acid (FOLVITE) A999333 MCG tablet Take 400 mcg by mouth daily.    . hydrocortisone 2.5 % lotion Apply topically daily.    . hydroxyurea (HYDREA) 500 MG capsule TAKE 1 CAPSULE DAILY, MAY  TAKE WITH FOOD TO MINIMIZE GASTROINTESTINAL SIDE      EFFECTS 90 capsule 3  . ketoconazole (  NIZORAL) 2 % cream Apply 1 application topically daily.    Marland Kitchen ketoconazole (NIZORAL) 2 % shampoo Apply topically 2 (two) times a week. 120 mL 3  . lisinopril (PRINIVIL,ZESTRIL) 10 MG tablet TAKE 1 TABLET DAILY 90 tablet 3  . OVER THE COUNTER MEDICATION Vitafusion men's complete vitamin gummies; pt chews two gummies daily.    Marland Kitchen triamcinolone cream (KENALOG) 0.1 %      No current facility-administered medications on file prior to visit.   The PMH, PSH, Social History, Family History, Medications, and allergies have been reviewed in [facility], and have been updated if relevant.    Review of Systems  Constitutional: Negative for fever, chills, diaphoresis, fatigue and unexpected weight change.  HENT: Negative.   Eyes: Negative.   Respiratory:  Negative.   Endocrine: Negative.   Genitourinary: Negative for urgency, decreased urine volume, scrotal swelling and testicular pain.  Musculoskeletal: Negative.   Skin: Negative.   Allergic/Immunologic: Negative.   Neurological: Negative.   Psychiatric/Behavioral: Negative.         Objective:   Physical Exam BP 120/60 mmHg  Pulse 78  Temp(Src) 97.6 F (36.4 C) (Oral)  Ht 5\' 11"  (1.803 m)  Wt 182 lb 8 oz (82.781 kg)  BMI 25.46 kg/m2  SpO2 97% Wt Readings from Last 3 Encounters:  05/08/15 182 lb 8 oz (82.781 kg)  03/29/15 187 lb 14.4 oz (85.231 kg)  03/29/15 186 lb (84.369 kg)    General:  overweght male in NAD Eyes:  PERRL Ears:  External ear exam shows no significant lesions or deformities.  Otoscopic examination reveals clear canals, tympanic membranes are intact bilaterally without bulging, retraction, inflammation or discharge. Hearing is grossly normal bilaterally. Nose:  External nasal examination shows no deformity or inflammation. Nasal mucosa are pink and moist without lesions or exudates. Mouth:  Oral mucosa and oropharynx without lesions or exudates.  Teeth in good repair. Neck:  no carotid bruit or thyromegaly no cervical or supraclavicular lymphadenopathy  Lungs:  Normal respiratory effort, chest expands symmetrically. Lungs are clear to auscultation, no crackles or wheezes. Heart:  Normal rate and regular rhythm. S1 and S2 normal without gallop, murmur, click, rub or other extra sounds. Abdomen:  Bowel sounds positive,abdomen soft and non-tender without masses, organomegaly or hernias noted. Genitalia:  Testes bilaterally descended without nodularity, tenderness or masses. No scrotal masses or lesions. No penis lesions or urethral discharge. Prostate:  Prostate gland firm and smooth, 1 plus enlargement, no nodularity, tenderness, mass, asymmetry or induration. Pulses:  R and L posterior tibial pulses are full and equal bilaterally  Extremities:  no edema        Assessment & Plan:

## 2015-05-08 NOTE — Assessment & Plan Note (Signed)
Followed by hematology 

## 2015-05-08 NOTE — Assessment & Plan Note (Signed)
Improving with clomid. Followed by urology.

## 2015-05-08 NOTE — Assessment & Plan Note (Signed)
Reviewed preventive care protocols, scheduled due services, and updated immunizations Discussed nutrition, exercise, diet, and healthy lifestyle.  

## 2015-05-08 NOTE — Assessment & Plan Note (Signed)
Well controlled on current rx.

## 2015-05-11 NOTE — Pre-Procedure Instructions (Signed)
[facility]  05/11/2015      [facility] [address] AT PORTAL TO REGISTERED [place] [address] Phone: [phone] Fax: [phone]  [id number] [address], [place] [address] Phone: [phone] Fax: [place], [place] [address] Phone: [phone] Fax: [phone]  [facility] [place], [place] [address] Phone: [phone] Fax: [phone]    Your procedure is scheduled on Mon, Feb 27 @ 7:30 AM  Report to [facility] Admitting at 5:30 AM  Call this number if you have problems the morning of surgery:  [phone]   Remember:  Do not eat food or drink liquids after midnight.                 Stop taking your Aspirin. No Goody's,BC's,Aleve,Advil,Motrin,Ibuprofen,Fish Oil,or any Herbal Medications.                 Do not wear jewelry.  Do not wear lotions, powders, or colognes.  You may wear deodorant.  Men may shave face and neck.  Do not bring valuables to the hospital.  [facility] is not responsible for any belongings or valuables.  Contacts, dentures or bridgework may not be worn into surgery.  Leave your suitcase in the car.  After surgery it may be brought to your room.  For patients admitted to the hospital, discharge time will be determined by your treatment team.  Patients discharged the day of surgery will not be allowed to drive home.    Special instructions:  [place] - Preparing for Surgery  Before surgery, you can play an important role.  Because skin is not sterile, your skin needs to be as free of germs as possible.  You can reduce the number of germs on you skin by washing with CHG (chlorahexidine gluconate) soap before surgery.  CHG is an  antiseptic cleaner which kills germs and bonds with the skin to continue killing germs even after washing.  Please DO NOT use if you have an allergy to CHG or antibacterial soaps.  If your skin becomes reddened/irritated stop using the CHG and inform your nurse when you arrive at Short Stay.  Do not shave (including legs and underarms) for at least 48 hours prior to the first CHG shower.  You may shave your face.  Please follow these instructions carefully:   1.  Shower with CHG Soap the night before surgery and the                                morning of Surgery.  2.  If you choose to wash your hair, wash your hair first as usual with your       normal shampoo.  3.  After you shampoo, rinse your hair and body thoroughly to remove the                      Shampoo.  4.  Use CHG as you would any other liquid soap.  You can apply chg directly       to the skin and wash gently with scrungie or a clean washcloth.  5.  Apply the  CHG Soap to your body ONLY FROM THE NECK DOWN.        Do not use on open wounds or open sores.  Avoid contact with your eyes,       ears, mouth and genitals (private parts).  Wash genitals (private parts)       with your normal soap.  6.  Wash thoroughly, paying special attention to the area where your surgery        will be performed.  7.  Thoroughly rinse your body with warm water from the neck down.  8.  DO NOT shower/wash with your normal soap after using and rinsing off       the CHG Soap.  9.  Pat yourself dry with a clean towel.            10.  Wear clean pajamas.            11.  Place clean sheets on your bed the night of your first shower and do not        sleep with pets.  Day of Surgery  Do not apply any lotions/deoderants the morning of surgery.  Please wear clean clothes to the hospital/surgery center.  [place] - Preparing for Surgery  Before surgery, you can play an important role.  Because skin is not sterile, your skin needs to be as free of germs as  possible.  You can reduce the number of germs on you skin by washing with CHG (chlorahexidine gluconate) soap before surgery.  CHG is an antiseptic cleaner which kills germs and bonds with the skin to continue killing germs even after washing.  Please DO NOT use if you have an allergy to CHG or antibacterial soaps.  If your skin becomes reddened/irritated stop using the CHG and inform your nurse when you arrive at Short Stay.  Do not shave (including legs and underarms) for at least 48 hours prior to the first CHG shower.  You may shave your face.  Please follow these instructions carefully:   1.  Shower with CHG Soap the night before surgery and the                                morning of Surgery.  2.  If you choose to wash your hair, wash your hair first as usual with your       normal shampoo.  3.  After you shampoo, rinse your hair and body thoroughly to remove the                      Shampoo.  4.  Use CHG as you would any other liquid soap.  You can apply chg directly       to the skin and wash gently with scrungie or a clean washcloth.  5.  Apply the CHG Soap to your body ONLY FROM THE NECK DOWN.        Do not use on open wounds or open sores.  Avoid contact with your eyes,       ears, mouth and genitals (private parts).  Wash genitals (private parts)       with your normal soap.  6.  Wash thoroughly, paying special attention to the area where your surgery        will be performed.  7.  Thoroughly rinse your body with warm water from the neck down.  8.  DO NOT shower/wash with your normal soap after using and rinsing off       the CHG Soap.  9.  Pat yourself dry with a clean towel.            10.  Wear clean pajamas.            11.  Place clean sheets on your bed the night of your first shower and do not        sleep with pets.  Day of Surgery  Do not apply any lotions/deoderants the morning of surgery.  Please wear clean clothes to the hospital/surgery center.    Please read over  the following fact sheets that you were given. Pain Booklet, Coughing and Deep Breathing, Blood Transfusion Information, MRSA Information and Surgical Site Infection Prevention

## 2015-05-12 ENCOUNTER — Encounter (HOSPITAL_COMMUNITY)
Admission: RE | Admit: 2015-05-12 | Discharge: 2015-05-12 | Disposition: A | Discharge status: Home / Self Care | Payer: BLUE CROSS/BLUE SHIELD | Source: Ambulatory Visit | Attending: Orthopedic Surgery | Admitting: Orthopedic Surgery

## 2015-05-12 ENCOUNTER — Encounter (HOSPITAL_COMMUNITY): Payer: Self-pay

## 2015-05-12 DIAGNOSIS — I739 Peripheral vascular disease, unspecified: Secondary | ICD-10-CM | POA: Insufficient documentation

## 2015-05-12 DIAGNOSIS — G4730 Sleep apnea, unspecified: Secondary | ICD-10-CM | POA: Diagnosis not present

## 2015-05-12 DIAGNOSIS — Z01812 Encounter for preprocedural laboratory examination: Secondary | ICD-10-CM | POA: Diagnosis not present

## 2015-05-12 DIAGNOSIS — D473 Essential (hemorrhagic) thrombocythemia: Secondary | ICD-10-CM | POA: Diagnosis not present

## 2015-05-12 DIAGNOSIS — Z86718 Personal history of other venous thrombosis and embolism: Secondary | ICD-10-CM | POA: Diagnosis not present

## 2015-05-12 DIAGNOSIS — Z7982 Long term (current) use of aspirin: Secondary | ICD-10-CM | POA: Diagnosis not present

## 2015-05-12 DIAGNOSIS — Z0183 Encounter for blood typing: Secondary | ICD-10-CM | POA: Diagnosis not present

## 2015-05-12 DIAGNOSIS — Z79899 Other long term (current) drug therapy: Secondary | ICD-10-CM | POA: Insufficient documentation

## 2015-05-12 DIAGNOSIS — Z01818 Encounter for other preprocedural examination: Secondary | ICD-10-CM | POA: Diagnosis not present

## 2015-05-12 DIAGNOSIS — I10 Essential (primary) hypertension: Secondary | ICD-10-CM | POA: Insufficient documentation

## 2015-05-12 DIAGNOSIS — M1711 Unilateral primary osteoarthritis, right knee: Secondary | ICD-10-CM | POA: Insufficient documentation

## 2015-05-12 HISTORY — DX: Thrombocytosis, unspecified: D75.839

## 2015-05-12 HISTORY — DX: Splenomegaly, not elsewhere classified: R16.1

## 2015-05-12 HISTORY — DX: Personal history of other medical treatment: Z92.89

## 2015-05-12 HISTORY — DX: Headache, unspecified: R51.9

## 2015-05-12 HISTORY — DX: Headache: R51

## 2015-05-12 HISTORY — DX: Peripheral vascular disease, unspecified: I73.9

## 2015-05-12 HISTORY — DX: Essential (hemorrhagic) thrombocythemia: D47.3

## 2015-05-12 HISTORY — DX: Unspecified osteoarthritis, unspecified site: M19.90

## 2015-05-12 HISTORY — DX: Sleep apnea, unspecified: G47.30

## 2015-05-12 HISTORY — DX: Acute embolism and thrombosis of unspecified deep veins of unspecified lower extremity: I82.409

## 2015-05-12 LAB — SURGICAL PCR SCREEN
MRSA, PCR: NEGATIVE
Staphylococcus aureus: NEGATIVE

## 2015-05-12 LAB — CBC WITH DIFFERENTIAL/PLATELET
BASOS ABS: 0.1 10*3/uL (ref 0.0–0.1)
BASOS PCT: 1 %
EOS PCT: 1 %
Eosinophils Absolute: 0.1 10*3/uL (ref 0.0–0.7)
HEMATOCRIT: 49.2 % (ref 39.0–52.0)
Hemoglobin: 16.9 g/dL (ref 13.0–17.0)
Lymphocytes Relative: 14 %
Lymphs Abs: 1.4 10*3/uL (ref 0.7–4.0)
MCH: 36.3 pg — ABNORMAL HIGH (ref 26.0–34.0)
MCHC: 34.3 g/dL (ref 30.0–36.0)
MCV: 105.6 fL — AB (ref 78.0–100.0)
MONO ABS: 0.8 10*3/uL (ref 0.1–1.0)
MONOS PCT: 8 %
Neutro Abs: 7.6 10*3/uL (ref 1.7–7.7)
Neutrophils Relative %: 76 %
PLATELETS: 429 10*3/uL — AB (ref 150–400)
RBC: 4.66 MIL/uL (ref 4.22–5.81)
RDW: 13.2 % (ref 11.5–15.5)
WBC: 10.1 10*3/uL (ref 4.0–10.5)

## 2015-05-12 LAB — URINALYSIS, ROUTINE W REFLEX MICROSCOPIC
Bilirubin Urine: NEGATIVE
GLUCOSE, UA: NEGATIVE mg/dL
Hgb urine dipstick: NEGATIVE
Ketones, ur: NEGATIVE mg/dL
LEUKOCYTES UA: NEGATIVE
Nitrite: NEGATIVE
Protein, ur: NEGATIVE mg/dL
SPECIFIC GRAVITY, URINE: 1.017 (ref 1.005–1.030)
pH: 5 (ref 5.0–8.0)

## 2015-05-12 LAB — TYPE AND SCREEN
ABO/RH(D): O POS
Antibody Screen: NEGATIVE

## 2015-05-12 LAB — BASIC METABOLIC PANEL
Anion gap: 9 (ref 5–15)
BUN: 15 mg/dL (ref 6–20)
CALCIUM: 9.5 mg/dL (ref 8.9–10.3)
CO2: 28 mmol/L (ref 22–32)
Chloride: 102 mmol/L (ref 101–111)
Creatinine, Ser: 1.12 mg/dL (ref 0.61–1.24)
GFR calc Af Amer: >60 mL/min (ref >60)
GLUCOSE: 101 mg/dL — AB (ref 65–99)
Potassium: 4.2 mmol/L (ref 3.5–5.1)
Sodium: 139 mmol/L (ref 135–145)

## 2015-05-12 LAB — ABO/RH: ABO/RH(D): O POS

## 2015-05-12 LAB — PROTIME-INR
INR: 1.17 (ref 0.00–1.49)
Prothrombin Time: 15.1 seconds (ref 11.6–15.2)

## 2015-05-12 LAB — APTT: APTT: 32 s (ref 24–37)

## 2015-05-12 NOTE — Progress Notes (Signed)
Stress test done in [place] 2008- pt. Reports the only thing that was found was that he needed BP med. Pt. Followed by Dr. Deborra Medina at [facility], recently seen. Unsure of where a prev. EKG was done. Pt. Denies chest concerns.

## 2015-05-15 NOTE — Progress Notes (Signed)
Anesthesia Chart Review:  Pt is a 60 year old male scheduled for R total knee arthroplasty on 05/22/2015 with Dr. Mayer Camel.   PMH includes:  HTN, PVD, DVT (2001), thrombocytosis, sleep apnea.   Medications include: ASA, doxazosin, hydroxyurea, lisinopril, viagra  Preoperative labs reviewed.   Chest x-ray 05/12/15 reviewed. No active cardiopulmonary disease.   EKG 05/12/15: NSR. Possible Left atrial enlargement. Left axis deviation. Possible Anterior infarct, age undetermined  Stress echo 05/08/05 (care everywhere):  1. Negative stress echo, no evidence of ischemia.  2. Good exercise capacity without chest pain or significant EKG changes.   Reviewed case with Dr. Lissa Hoard.   If no changes, I anticipate pt can proceed with surgery as scheduled.   Willeen Cass, FNP-BC [facility] Stay Surgical Center/Anesthesiology Phone: [phone] 05/15/2015 3:49 PM

## 2015-05-18 ENCOUNTER — Encounter: Payer: Self-pay | Admitting: Cardiology

## 2015-05-18 ENCOUNTER — Ambulatory Visit (INDEPENDENT_AMBULATORY_CARE_PROVIDER_SITE_OTHER): Payer: BLUE CROSS/BLUE SHIELD | Admitting: Cardiology

## 2015-05-18 VITALS — BP 140/76 | HR 70 | Ht 71.0 in | Wt 186.2 lb

## 2015-05-18 DIAGNOSIS — Z01818 Encounter for other preprocedural examination: Secondary | ICD-10-CM | POA: Diagnosis not present

## 2015-05-18 DIAGNOSIS — D473 Essential (hemorrhagic) thrombocythemia: Secondary | ICD-10-CM | POA: Diagnosis not present

## 2015-05-18 DIAGNOSIS — I10 Essential (primary) hypertension: Secondary | ICD-10-CM

## 2015-05-18 NOTE — Patient Instructions (Signed)
Medication Instructions:  RESTART ASPIRIN 81 MG DAILY  Labwork: NONE  Testing/Procedures: NONE  Follow-Up: AS NEEDED   Any Other Special Instructions Will Be Listed Below (If Applicable). YOU ARE CLEARED FROM A CARDIAC STANDPOINT FOR SURGERY NEXT WEEK   If you need a refill on your cardiac medications before your next appointment, please call your pharmacy.

## 2015-05-18 NOTE — Progress Notes (Signed)
Cardiology Office Note   Date:  05/18/2015   ID:  Dennis Martin, Dennis Martin 03/02/1956, MRN [id number]  PCP:  Arnette Norris, MD  Cardiologist: Darlin Coco MD  Chief Complaint  Patient presents with  . surgical clearance      History of Present Illness: Dennis Martin is a 60 y.o. male who presents for preoperative cardiology clearance.  The patient is scheduled for a right total knee replacement on Monday, February 27 by Dr. Ulyses Southward.  He had a preoperative electrocardiogram on 05/12/2015 which was mildly abnormal. The patient does not have any history of known heart problems.  He does not have any prior electrocardiograms in epic.  He has not been having any symptoms to suggest coronary artery disease.  He denies any substernal chest discomfort or unusual shortness of breath.  His walking activity has been limited recently because of his right knee pain.  Over the past several years he has been less physically active.  Over the past 10 years he has gained about 30 pounds.  The patient has a history of questionable sleep apnea and has had prior soft palate surgery in [facility] for alleviation of this. He has not been aware of any palpitations.  No dizziness or syncope.  He does have a history of high blood pressure and is on lisinopril.  He is not diabetic. He has a history of thrombocytosis and splenomegaly.  He is followed by hematology.  He is on Hydrea.  He has been on a daily baby aspirin His family history reveals that his father died at age 86 of prostate problems and blood clots and intestinal aneurysms.  His mother died at 39 of a heart attack. Social history reveals that he works on [employer].  He is married.    Past Medical History  Diagnosis Date  . BPH (benign prostatic hyperplasia)   . Hypertension   . Essential thrombocytosis ([place])   . History of stress test 2008    done in [place] ,nuclear stress test- told that it was normal  . Peripheral  vascular disease ([place])   . DVT of leg (deep venous thrombosis) ([place]) 2001    post knee arthroscopy,also complicated by infection, PICC line used for long term antibiotics, was on coumadin x's 6 months  . Sleep apnea     did surgery for the problem  . Spleen enlarged   . Headache     relative to testosterone level, it had been a problem, improved with clomid  . Thrombocythemia ([place])     monitored by Dr. Earlie Server  . Arthritis     both arthritis, wrist, hands     Past Surgical History  Procedure Laterality Date  . Ulnar nerve transposition  2007    left  . Knee surgery Bilateral 2001  . Tonsillectomy    . Eye surgery      chalazion on both eyelids  . Nasal sinus surgery       Current Outpatient Prescriptions  Medication Sig Dispense Refill  . aspirin 81 MG tablet Take 81 mg by mouth daily.    . calcium carbonate (TUMS EX) 750 MG chewable tablet Chew 1 tablet by mouth daily as needed for heartburn.    . cetaphil (CETAPHIL) cream Apply topically as needed.    . clomiPHENE (CLOMID) 50 MG tablet Take 50 mg by mouth daily.    Marland Kitchen desonide (DESOWEN) 0.05 % cream Apply 1 application topically 2 (two) times daily as needed (  rash).    . folic acid (FOLVITE) A999333 MCG tablet Take 400 mcg by mouth daily.    . hydrocortisone 2.5 % lotion Apply 1 application topically daily as needed (rash/itching).     . hydroxyurea (HYDREA) 500 MG capsule Take 500 mg by mouth daily. Take 2 capsules by mouth once daily each FridayMay take with food to minimize GI side effects.    Marland Kitchen ketoconazole (NIZORAL) 2 % cream Apply 1 application topically daily.    Marland Kitchen ketoconazole (NIZORAL) 2 % cream Apply 1 application topically daily as needed for irritation.    Marland Kitchen lisinopril (PRINIVIL,ZESTRIL) 10 MG tablet Take 10 mg by mouth daily.    Marland Kitchen OVER THE COUNTER MEDICATION Vitafusion men's complete vitamin gummies; pt chews two gummies daily.    . sildenafil (VIAGRA) 100 MG tablet Take 0.5-1 tablets (50-100 mg total) by mouth  daily as needed for erectile dysfunction. 5 tablet 11  . triamcinolone cream (KENALOG) 0.1 % Apply 1 application topically 2 (two) times daily as needed (skin irritation).      No current facility-administered medications for this visit.    Allergies:   Keflex    Social History:  The patient  reports that he quit smoking about 12 years ago. He has never used smokeless tobacco. He reports that he drinks about 1.2 oz of alcohol per week. He reports that he does not use illicit drugs.   Family History:  The patient's family history includes Colon cancer (age of onset: 64) in his paternal aunt. There is no history of Stomach cancer.    ROS:  Please see the history of present illness.   Otherwise, review of systems are positive for none.   All other systems are reviewed and negative.    PHYSICAL EXAM: VS:  BP 140/76 mmHg  Pulse 70  Ht 5\' 11"  (1.803 m)  Wt 186 lb 3.2 oz (84.46 kg)  BMI 25.98 kg/m2 , BMI Body mass index is 25.98 kg/(m^2). GEN: Well nourished, well developed, in no acute distress HEENT: normal Neck: no JVD, carotid bruits, or masses Cardiac: RRR; no murmurs, rubs, or gallops,no edema  Respiratory:  clear to auscultation bilaterally, normal work of breathing GI: soft, nontender, nondistended, + BS.  Fullness in the left upper quadrant consistent with known splenomegaly MS: no deformity or atrophy Skin: warm and dry, no rash Neuro:  Strength and sensation are intact Psych: euthymic mood, full affect   EKG:  EKG is ordered today. The ekg ordered today demonstrates normal sinus rhythm at 70 bpm.  Left axis deviation.  Compared with the previous EKG of 05/12/15, R-wave progression is normal.  No ischemic changes.   Recent Labs: 05/01/2015: ALT 17 05/12/2015: BUN 15; Creatinine, Ser 1.12; Hemoglobin 16.9; Platelets 429*; Potassium 4.2; Sodium 139    Lipid Panel    Component Value Date/Time   CHOL 160 05/01/2015 0938   TRIG 75.0 05/01/2015 0938   HDL 46.50 05/01/2015  0938   CHOLHDL 3 05/01/2015 0938   VLDL 15.0 05/01/2015 0938   LDLCALC 99 05/01/2015 0938      Wt Readings from Last 3 Encounters:  05/18/15 186 lb 3.2 oz (84.46 kg)  05/12/15 185 lb 1.6 oz (83.961 kg)  05/08/15 182 lb 8 oz (82.781 kg)         ASSESSMENT AND PLAN:  1.  Normal cardiac examination.  The previous EKG abnormality with poor R-wave progression may have been secondary to lead placement. 2.  Essential thrombocytosis 3.  Essential hypertension controlled  on lisinopril 4.  Osteoarthritis, needing total knee replacement of right knee. 5.  Left axis deviation probably secondary to hypertension.  Recommendation: The patient is cleared from a cardiac standpoint to proceed with planned orthopedic surgery on Monday, February 27.   Current medicines are reviewed at length with the patient today.  The patient does not have concerns regarding medicines.  The following changes have been made:  no change  Labs/ tests ordered today include:   Orders Placed This Encounter  Procedures  . EKG 12-Lead     Disposition:   The patient is cleared for orthopedic surgery.  Continue current medication.  Return here on an as-needed basis.  Berna Spare MD 05/18/2015 11:36 AM    [place], [place], [place]  [address] Phone: [phone]; Fax: [phone]

## 2015-05-19 NOTE — H&P (Signed)
TOTAL KNEE ADMISSION H&P  Patient is being admitted for right total knee arthroplasty.  Subjective:  Chief Complaint:right knee pain.  HPI: Dennis Martin, 60 y.o. male, has a history of pain and functional disability in the right knee due to arthritis and has failed non-surgical conservative treatments for greater than 12 weeks to includeNSAID's and/or analgesics, corticosteriod injections, viscosupplementation injections, flexibility and strengthening excercises and activity modification.  Onset of symptoms was gradual, starting 10 years ago with gradually worsening course since that time. The patient noted prior procedures on the knee to include  arthroscopy on the right knee(s).  Patient currently rates pain in the right knee(s) at 10 out of 10 with activity. Patient has night pain, worsening of pain with activity and weight bearing, pain that interferes with activities of daily living, pain with passive range of motion, crepitus and joint swelling.  Patient has evidence of subchondral sclerosis and joint space narrowing by imaging studies.  There is no active infection.  Patient Active Problem List   Diagnosis Date Noted  . Preoperative evaluation to rule out surgical contraindication 05/18/2015  . Low testosterone 05/08/2015  . Erectile dysfunction 05/08/2015  . Splenomegaly 03/29/2015  . Hyperkalemia 05/03/2014  . Routine general medical examination at a health care facility 04/14/2012  . Essential thrombocytosis ([place]) 08/12/2011  . HTN (hypertension) 08/12/2011  . BPH (benign prostatic hyperplasia) 08/12/2011   Past Medical History  Diagnosis Date  . BPH (benign prostatic hyperplasia)   . Hypertension   . Essential thrombocytosis ([place])   . History of stress test 2008    done in [place] ,nuclear stress test- told that it was normal  . Peripheral vascular disease ([place])   . DVT of leg (deep venous thrombosis) ([place]) 2001    post knee arthroscopy,also complicated by infection,  PICC line used for long term antibiotics, was on coumadin x's 6 months  . Sleep apnea     did surgery for the problem  . Spleen enlarged   . Headache     relative to testosterone level, it had been a problem, improved with clomid  . Thrombocythemia ([place])     monitored by Dr. Earlie Server  . Arthritis     both arthritis, wrist, hands     Past Surgical History  Procedure Laterality Date  . Ulnar nerve transposition  2007    left  . Knee surgery Bilateral 2001  . Tonsillectomy    . Eye surgery      chalazion on both eyelids  . Nasal sinus surgery      No prescriptions prior to admission   Allergies  Allergen Reactions  . Keflex [Cephalexin]     rash    Social History  Substance Use Topics  . Smoking status: Former Smoker    Quit date: 05/06/2003  . Smokeless tobacco: Never Used     Comment: Quit 2005  . Alcohol Use: 1.2 oz/week    2 Cans of beer per week     Comment: 2 beers daily    Family History  Problem Relation Age of Onset  . Stomach cancer Neg Hx   . Colon cancer Paternal Aunt 37     Review of Systems  Constitutional: Positive for malaise/fatigue and diaphoresis.  Eyes: Negative.   Respiratory: Negative.   Cardiovascular:       Htn and hx of blood clots  Gastrointestinal: Positive for heartburn, abdominal pain and blood in stool.  Genitourinary: Positive for urgency.       Weak  stream, enlarged prostate, and ED  Musculoskeletal: Positive for joint pain.  Skin: Positive for rash.  Neurological: Positive for headaches.  Endo/Heme/Allergies: Negative.   Psychiatric/Behavioral: The patient has insomnia.     Objective:  Physical Exam  Constitutional: He is oriented to person, place, and time. He appears well-developed and well-nourished.  HENT:  Head: Normocephalic and atraumatic.  Eyes: Pupils are equal, round, and reactive to light.  Neck: Normal range of motion. Neck supple.  Cardiovascular: Intact distal pulses.   Respiratory: Effort normal.   Musculoskeletal: He exhibits tenderness.  Tender along the medial joint line of the right greater than left knee, range of motion 5/125 bilaterally, no effusion, collateral ligaments are stable.  Quadriceps and hamstring power is intact.  Normal pulses to his feet.    Neurological: He is alert and oriented to person, place, and time.  Skin: Skin is warm and dry.  Psychiatric: He has a normal mood and affect. His behavior is normal. Judgment and thought content normal.    Vital signs in last 24 hours:    Labs:   Estimated body mass index is 25.22 kg/(m^2) as calculated from the following:   Height as of 03/29/15: 6' (1.829 m).   Weight as of 03/29/15: 84.369 kg (186 lb).   Imaging Review Plain radiographs demonstrate AP, [place]berg, lateral and sunrise x-rays of both knees show bone-on-bone arthritis of the right knee with lateral subluxation of the tibia beneath the femur.  On the left side the arthritis is moderate with a couple millimeters of remaining cartilage medially.  Assessment/Plan:  End stage arthritis, right knee   The patient history, physical examination, clinical judgment of the provider and imaging studies are consistent with end stage degenerative joint disease of the right knee(s) and total knee arthroplasty is deemed medically necessary. The treatment options including medical management, injection therapy arthroscopy and arthroplasty were discussed at length. The risks and benefits of total knee arthroplasty were presented and reviewed. The risks due to aseptic loosening, infection, stiffness, patella tracking problems, thromboembolic complications and other imponderables were discussed. The patient acknowledged the explanation, agreed to proceed with the plan and consent was signed. Patient is being admitted for inpatient treatment for surgery, pain control, PT, OT, prophylactic antibiotics, VTE prophylaxis, progressive ambulation and ADL's and discharge planning. The  patient is planning to be discharged home with home health services

## 2015-05-21 DIAGNOSIS — M1711 Unilateral primary osteoarthritis, right knee: Secondary | ICD-10-CM | POA: Diagnosis present

## 2015-05-21 MED ORDER — VANCOMYCIN HCL 10 G IV SOLR
1500.0000 mg | INTRAVENOUS | Status: AC
  Administered 2015-05-22: 1500 mg via INTRAVENOUS
  Filled 2015-05-21: qty 1500

## 2015-05-22 ENCOUNTER — Inpatient Hospital Stay (HOSPITAL_COMMUNITY): Payer: BLUE CROSS/BLUE SHIELD | Admitting: Emergency Medicine

## 2015-05-22 ENCOUNTER — Encounter (HOSPITAL_COMMUNITY): Payer: Self-pay | Admitting: General Practice

## 2015-05-22 ENCOUNTER — Encounter (HOSPITAL_COMMUNITY)
Admission: RE | Disposition: A | Discharge status: Home Health | Payer: Self-pay | Source: Ambulatory Visit | Attending: Orthopedic Surgery

## 2015-05-22 ENCOUNTER — Inpatient Hospital Stay (HOSPITAL_COMMUNITY)
Admission: RE | Admit: 2015-05-22 | Discharge: 2015-05-23 | DRG: 470 | Disposition: A | Discharge status: Home Health | Payer: BLUE CROSS/BLUE SHIELD | Source: Ambulatory Visit | Attending: Orthopedic Surgery | Admitting: Orthopedic Surgery

## 2015-05-22 ENCOUNTER — Inpatient Hospital Stay (HOSPITAL_COMMUNITY): Payer: BLUE CROSS/BLUE SHIELD | Admitting: Vascular Surgery

## 2015-05-22 DIAGNOSIS — N401 Enlarged prostate with lower urinary tract symptoms: Secondary | ICD-10-CM | POA: Diagnosis present

## 2015-05-22 DIAGNOSIS — M25561 Pain in right knee: Secondary | ICD-10-CM | POA: Diagnosis present

## 2015-05-22 DIAGNOSIS — Z87891 Personal history of nicotine dependence: Secondary | ICD-10-CM

## 2015-05-22 DIAGNOSIS — Z86718 Personal history of other venous thrombosis and embolism: Secondary | ICD-10-CM | POA: Diagnosis not present

## 2015-05-22 DIAGNOSIS — M1711 Unilateral primary osteoarthritis, right knee: Secondary | ICD-10-CM | POA: Diagnosis present

## 2015-05-22 DIAGNOSIS — I10 Essential (primary) hypertension: Secondary | ICD-10-CM | POA: Diagnosis present

## 2015-05-22 DIAGNOSIS — R3912 Poor urinary stream: Secondary | ICD-10-CM | POA: Diagnosis present

## 2015-05-22 DIAGNOSIS — D62 Acute posthemorrhagic anemia: Secondary | ICD-10-CM | POA: Diagnosis not present

## 2015-05-22 DIAGNOSIS — Z881 Allergy status to other antibiotic agents status: Secondary | ICD-10-CM | POA: Diagnosis not present

## 2015-05-22 DIAGNOSIS — R161 Splenomegaly, not elsewhere classified: Secondary | ICD-10-CM | POA: Diagnosis present

## 2015-05-22 DIAGNOSIS — D473 Essential (hemorrhagic) thrombocythemia: Secondary | ICD-10-CM | POA: Diagnosis present

## 2015-05-22 DIAGNOSIS — I739 Peripheral vascular disease, unspecified: Secondary | ICD-10-CM | POA: Diagnosis present

## 2015-05-22 HISTORY — PX: TOTAL KNEE ARTHROPLASTY: SHX125

## 2015-05-22 SURGERY — ARTHROPLASTY, KNEE, TOTAL
Anesthesia: Spinal | Site: Knee | Laterality: Right

## 2015-05-22 MED ORDER — CLOMIPHENE CITRATE 50 MG PO TABS
50.0000 mg | ORAL_TABLET | Freq: Every day | ORAL | Status: DC
  Administered 2015-05-22: 50 mg via ORAL
  Filled 2015-05-22 (×4): qty 1

## 2015-05-22 MED ORDER — CALCIUM CARBONATE ANTACID 750 MG PO CHEW: 1.0000 | CHEWABLE_TABLET | Freq: Every day | ORAL | Status: DC | PRN

## 2015-05-22 MED ORDER — CHLORHEXIDINE GLUCONATE 4 % EX LIQD: 60.0000 mL | Freq: Once | CUTANEOUS | Status: DC

## 2015-05-22 MED ORDER — ASPIRIN EC 325 MG PO TBEC
325.0000 mg | DELAYED_RELEASE_TABLET | Freq: Every day | ORAL | Status: DC
  Administered 2015-05-23: 325 mg via ORAL
  Filled 2015-05-22: qty 1

## 2015-05-22 MED ORDER — BUPIVACAINE-EPINEPHRINE 0.5% -1:200000 IJ SOLN
INTRAMUSCULAR | Status: DC | PRN
  Administered 2015-05-22: 20 mL

## 2015-05-22 MED ORDER — FENTANYL CITRATE (PF) 250 MCG/5ML IJ SOLN
INTRAMUSCULAR | Status: AC
  Filled 2015-05-22: qty 5

## 2015-05-22 MED ORDER — MIDAZOLAM HCL 2 MG/2ML IJ SOLN
INTRAMUSCULAR | Status: DC | PRN
  Administered 2015-05-22 (×2): 1 mg via INTRAVENOUS

## 2015-05-22 MED ORDER — ALUM & MAG HYDROXIDE-SIMETH 200-200-20 MG/5ML PO SUSP
30.0000 mL | ORAL | Status: DC | PRN
Start: 2015-05-22 — End: 2015-05-23

## 2015-05-22 MED ORDER — CALCIUM CARBONATE ANTACID 500 MG PO CHEW
1.5000 | CHEWABLE_TABLET | Freq: Every day | ORAL | Status: DC | PRN
Start: 2015-05-22 — End: 2015-05-23

## 2015-05-22 MED ORDER — DEXTROSE-NACL 5-0.45 % IV SOLN: INTRAVENOUS | Status: DC

## 2015-05-22 MED ORDER — BISACODYL 5 MG PO TBEC: 5.0000 mg | DELAYED_RELEASE_TABLET | Freq: Every day | ORAL | Status: DC | PRN

## 2015-05-22 MED ORDER — FOLIC ACID 1 MG PO TABS
0.5000 mg | ORAL_TABLET | Freq: Every day | ORAL | Status: DC
  Administered 2015-05-22 – 2015-05-23 (×2): 0.5 mg via ORAL
  Filled 2015-05-22 (×2): qty 1

## 2015-05-22 MED ORDER — METOCLOPRAMIDE HCL 5 MG PO TABS: 5.0000 mg | ORAL_TABLET | Freq: Three times a day (TID) | ORAL | Status: DC | PRN

## 2015-05-22 MED ORDER — METHOCARBAMOL 1000 MG/10ML IJ SOLN
500.0000 mg | Freq: Four times a day (QID) | INTRAVENOUS | Status: DC | PRN
  Filled 2015-05-22: qty 5

## 2015-05-22 MED ORDER — ONDANSETRON HCL 4 MG/2ML IJ SOLN
INTRAMUSCULAR | Status: DC | PRN
  Administered 2015-05-22: 4 mg via INTRAVENOUS

## 2015-05-22 MED ORDER — PHENYLEPHRINE HCL 10 MG/ML IJ SOLN
INTRAMUSCULAR | Status: DC | PRN
  Administered 2015-05-22: 120 ug via INTRAVENOUS

## 2015-05-22 MED ORDER — LIDOCAINE HCL (CARDIAC) 20 MG/ML IV SOLN: INTRAVENOUS | Status: DC | PRN

## 2015-05-22 MED ORDER — FLEET ENEMA 7-19 GM/118ML RE ENEM: 1.0000 | ENEMA | Freq: Once | RECTAL | Status: DC | PRN

## 2015-05-22 MED ORDER — CETAPHIL EX CREA: TOPICAL_CREAM | CUTANEOUS | Status: DC | PRN

## 2015-05-22 MED ORDER — DESONIDE 0.05 % EX CREA: 1.0000 "application " | TOPICAL_CREAM | Freq: Two times a day (BID) | CUTANEOUS | Status: DC | PRN

## 2015-05-22 MED ORDER — TRANEXAMIC ACID 1000 MG/10ML IV SOLN
2000.0000 mg | Freq: Once | INTRAVENOUS | Status: AC
  Administered 2015-05-22: 2000 mg via TOPICAL
  Filled 2015-05-22: qty 20

## 2015-05-22 MED ORDER — DIPHENHYDRAMINE HCL 12.5 MG/5ML PO ELIX
12.5000 mg | ORAL_SOLUTION | ORAL | Status: DC | PRN
  Filled 2015-05-22: qty 10

## 2015-05-22 MED ORDER — ONDANSETRON HCL 4 MG PO TABS: 4.0000 mg | ORAL_TABLET | Freq: Four times a day (QID) | ORAL | Status: DC | PRN

## 2015-05-22 MED ORDER — 0.9 % SODIUM CHLORIDE (POUR BTL) OPTIME
TOPICAL | Status: DC | PRN
  Administered 2015-05-22: 1000 mL

## 2015-05-22 MED ORDER — PROPOFOL 10 MG/ML IV BOLUS
INTRAVENOUS | Status: DC | PRN
  Administered 2015-05-22 (×2): 20 mg via INTRAVENOUS
  Administered 2015-05-22: 30 mg via INTRAVENOUS

## 2015-05-22 MED ORDER — SODIUM CHLORIDE 0.9 % IR SOLN
Status: DC | PRN
  Administered 2015-05-22: 3000 mL

## 2015-05-22 MED ORDER — TOBRAMYCIN SULFATE 1.2 G IJ SOLR
INTRAMUSCULAR | Status: DC | PRN
  Administered 2015-05-22: 1.2 g

## 2015-05-22 MED ORDER — ACETAMINOPHEN 650 MG RE SUPP: 650.0000 mg | Freq: Four times a day (QID) | RECTAL | Status: DC | PRN

## 2015-05-22 MED ORDER — HYDROMORPHONE HCL 1 MG/ML IJ SOLN
0.5000 mg | INTRAMUSCULAR | Status: DC | PRN
  Administered 2015-05-23: 1 mg via INTRAVENOUS
  Filled 2015-05-22: qty 1

## 2015-05-22 MED ORDER — LIDOCAINE HCL (CARDIAC) 20 MG/ML IV SOLN
INTRAVENOUS | Status: AC
  Filled 2015-05-22: qty 5

## 2015-05-22 MED ORDER — OXYCODONE HCL 5 MG PO TABS
5.0000 mg | ORAL_TABLET | ORAL | Status: DC | PRN
  Administered 2015-05-22 – 2015-05-23 (×9): 10 mg via ORAL
  Filled 2015-05-22 (×10): qty 2

## 2015-05-22 MED ORDER — PROMETHAZINE HCL 25 MG/ML IJ SOLN: 6.2500 mg | INTRAMUSCULAR | Status: DC | PRN

## 2015-05-22 MED ORDER — LIDOCAINE HCL (CARDIAC) 20 MG/ML IV SOLN
INTRAVENOUS | Status: DC | PRN
  Administered 2015-05-22: 60 mg via INTRATRACHEAL

## 2015-05-22 MED ORDER — BUPIVACAINE-EPINEPHRINE (PF) 0.5% -1:200000 IJ SOLN
INTRAMUSCULAR | Status: AC
  Filled 2015-05-22: qty 30

## 2015-05-22 MED ORDER — SENNOSIDES-DOCUSATE SODIUM 8.6-50 MG PO TABS: 1.0000 | ORAL_TABLET | Freq: Every evening | ORAL | Status: DC | PRN

## 2015-05-22 MED ORDER — PHENYLEPHRINE HCL 10 MG/ML IJ SOLN
10.0000 mg | INTRAVENOUS | Status: DC | PRN
  Administered 2015-05-22: 10 ug/min via INTRAVENOUS

## 2015-05-22 MED ORDER — BUPIVACAINE LIPOSOME 1.3 % IJ SUSP
20.0000 mL | Freq: Once | INTRAMUSCULAR | Status: AC
  Administered 2015-05-22: 20 mL
  Filled 2015-05-22: qty 20

## 2015-05-22 MED ORDER — HYDROCORTISONE 2.5 % EX LOTN: 1.0000 "application " | TOPICAL_LOTION | Freq: Every day | CUTANEOUS | Status: DC | PRN

## 2015-05-22 MED ORDER — MIDAZOLAM HCL 2 MG/2ML IJ SOLN
INTRAMUSCULAR | Status: AC
  Filled 2015-05-22: qty 2

## 2015-05-22 MED ORDER — PHENOL 1.4 % MT LIQD: 1.0000 | OROMUCOSAL | Status: DC | PRN

## 2015-05-22 MED ORDER — ACETAMINOPHEN 325 MG PO TABS: 650.0000 mg | ORAL_TABLET | Freq: Four times a day (QID) | ORAL | Status: DC | PRN

## 2015-05-22 MED ORDER — LACTATED RINGERS IV SOLN
INTRAVENOUS | Status: DC | PRN
  Administered 2015-05-22 (×2): via INTRAVENOUS

## 2015-05-22 MED ORDER — FOLIC ACID 400 MCG PO TABS: 400.0000 ug | ORAL_TABLET | Freq: Every day | ORAL | Status: DC

## 2015-05-22 MED ORDER — MENTHOL 3 MG MT LOZG: 1.0000 | LOZENGE | OROMUCOSAL | Status: DC | PRN

## 2015-05-22 MED ORDER — HYDROCORTISONE 1 % EX CREA
TOPICAL_CREAM | Freq: Every day | CUTANEOUS | Status: DC | PRN
  Filled 2015-05-22: qty 28

## 2015-05-22 MED ORDER — TOBRAMYCIN SULFATE 1.2 G IJ SOLR
INTRAMUSCULAR | Status: AC
  Filled 2015-05-22: qty 1.2

## 2015-05-22 MED ORDER — GLYCOPYRROLATE 0.2 MG/ML IJ SOLN
INTRAMUSCULAR | Status: AC
  Filled 2015-05-22: qty 1

## 2015-05-22 MED ORDER — HYDROMORPHONE HCL 1 MG/ML IJ SOLN: 0.2500 mg | INTRAMUSCULAR | Status: DC | PRN

## 2015-05-22 MED ORDER — DOCUSATE SODIUM 100 MG PO CAPS
100.0000 mg | ORAL_CAPSULE | Freq: Two times a day (BID) | ORAL | Status: DC
  Administered 2015-05-22 – 2015-05-23 (×3): 100 mg via ORAL
  Filled 2015-05-22 (×3): qty 1

## 2015-05-22 MED ORDER — HYDROXYUREA 500 MG PO CAPS
500.0000 mg | ORAL_CAPSULE | Freq: Every day | ORAL | Status: DC
  Administered 2015-05-22: 500 mg via ORAL
  Filled 2015-05-22 (×2): qty 1

## 2015-05-22 MED ORDER — HYDROXYUREA 500 MG PO CAPS: 500.0000 mg | ORAL_CAPSULE | ORAL | Status: DC

## 2015-05-22 MED ORDER — FENTANYL CITRATE (PF) 250 MCG/5ML IJ SOLN
INTRAMUSCULAR | Status: DC | PRN
  Administered 2015-05-22 (×4): 25 ug via INTRAVENOUS
  Administered 2015-05-22 (×3): 50 ug via INTRAVENOUS

## 2015-05-22 MED ORDER — GLYCOPYRROLATE 0.2 MG/ML IJ SOLN
INTRAMUSCULAR | Status: DC | PRN
  Administered 2015-05-22: 0.1 mg via INTRAVENOUS

## 2015-05-22 MED ORDER — METHOCARBAMOL 500 MG PO TABS: 500.0000 mg | ORAL_TABLET | Freq: Two times a day (BID) | ORAL | Status: DC

## 2015-05-22 MED ORDER — METHOCARBAMOL 500 MG PO TABS
500.0000 mg | ORAL_TABLET | Freq: Four times a day (QID) | ORAL | Status: DC | PRN
  Administered 2015-05-22 – 2015-05-23 (×3): 500 mg via ORAL
  Filled 2015-05-22 (×3): qty 1

## 2015-05-22 MED ORDER — TRIAMCINOLONE ACETONIDE 0.1 % EX CREA
1.0000 "application " | TOPICAL_CREAM | Freq: Two times a day (BID) | CUTANEOUS | Status: DC | PRN
  Filled 2015-05-22: qty 15

## 2015-05-22 MED ORDER — HYDROXYUREA 500 MG PO CAPS: 500.0000 mg | ORAL_CAPSULE | Freq: Every day | ORAL | Status: DC

## 2015-05-22 MED ORDER — LISINOPRIL 10 MG PO TABS
10.0000 mg | ORAL_TABLET | Freq: Every day | ORAL | Status: DC
  Administered 2015-05-22 – 2015-05-23 (×2): 10 mg via ORAL
  Filled 2015-05-22 (×2): qty 1

## 2015-05-22 MED ORDER — KCL IN DEXTROSE-NACL 20-5-0.45 MEQ/L-%-% IV SOLN
INTRAVENOUS | Status: DC
  Administered 2015-05-23: 03:00:00 via INTRAVENOUS
  Filled 2015-05-22 (×2): qty 1000

## 2015-05-22 MED ORDER — METOCLOPRAMIDE HCL 5 MG/ML IJ SOLN: 5.0000 mg | Freq: Three times a day (TID) | INTRAMUSCULAR | Status: DC | PRN

## 2015-05-22 MED ORDER — OXYCODONE-ACETAMINOPHEN 5-325 MG PO TABS
1.0000 | ORAL_TABLET | ORAL | Status: DC | PRN
Start: 2015-05-22 — End: 2015-07-17

## 2015-05-22 MED ORDER — ONDANSETRON HCL 4 MG/2ML IJ SOLN
INTRAMUSCULAR | Status: AC
  Filled 2015-05-22: qty 2

## 2015-05-22 MED ORDER — PROPOFOL 500 MG/50ML IV EMUL
INTRAVENOUS | Status: DC | PRN
  Administered 2015-05-22: 30 ug/kg/min via INTRAVENOUS

## 2015-05-22 MED ORDER — ASPIRIN EC 325 MG PO TBEC: 325.0000 mg | DELAYED_RELEASE_TABLET | Freq: Two times a day (BID) | ORAL | Status: DC

## 2015-05-22 MED ORDER — CETAPHIL MOISTURIZING EX LOTN
TOPICAL_LOTION | CUTANEOUS | Status: DC | PRN
  Filled 2015-05-22: qty 473

## 2015-05-22 MED ORDER — CEFUROXIME SODIUM 1.5 G IJ SOLR
INTRAMUSCULAR | Status: AC
  Filled 2015-05-22: qty 1.5

## 2015-05-22 MED ORDER — ONDANSETRON HCL 4 MG/2ML IJ SOLN: 4.0000 mg | Freq: Four times a day (QID) | INTRAMUSCULAR | Status: DC | PRN

## 2015-05-22 MED ORDER — SODIUM CHLORIDE 0.9 % IJ SOLN
INTRAMUSCULAR | Status: DC | PRN
  Administered 2015-05-22: 20 mL

## 2015-05-22 SURGICAL SUPPLY — 53 items
BANDAGE ESMARK 6X9 LF (GAUZE/BANDAGES/DRESSINGS) ×1 IMPLANT
BLADE SAG 18X100X1.27 (BLADE) ×3 IMPLANT
BLADE SAW SGTL 13X75X1.27 (BLADE) ×3 IMPLANT
BLADE SURG ROTATE 9660 (MISCELLANEOUS) IMPLANT
BNDG ELASTIC 6X10 VLCR STRL LF (GAUZE/BANDAGES/DRESSINGS) ×3 IMPLANT
BNDG ESMARK 6X9 LF (GAUZE/BANDAGES/DRESSINGS) ×3
BOWL SMART MIX CTS (DISPOSABLE) ×3 IMPLANT
CAPT KNEE TOTAL 3 ATTUNE ×3 IMPLANT
CEMENT HV SMART SET (Cement) ×6 IMPLANT
COVER SURGICAL LIGHT HANDLE (MISCELLANEOUS) ×3 IMPLANT
CUFF TOURNIQUET SINGLE 34IN LL (TOURNIQUET CUFF) ×3 IMPLANT
CUFF TOURNIQUET SINGLE 44IN (TOURNIQUET CUFF) IMPLANT
DRAPE EXTREMITY T 121X128X90 (DRAPE) ×3 IMPLANT
DRAPE U-SHAPE 47X51 STRL (DRAPES) ×3 IMPLANT
DRSG AQUACEL AG ADV 3.5X10 (GAUZE/BANDAGES/DRESSINGS) ×3 IMPLANT
DURAPREP 26ML APPLICATOR (WOUND CARE) ×6 IMPLANT
ELECT REM PT RETURN 9FT ADLT (ELECTROSURGICAL) ×3
ELECTRODE REM PT RTRN 9FT ADLT (ELECTROSURGICAL) ×1 IMPLANT
EVACUATOR 1/8 PVC DRAIN (DRAIN) IMPLANT
GLOVE BIO SURGEON STRL SZ7.5 (GLOVE) ×3 IMPLANT
GLOVE BIO SURGEON STRL SZ8.5 (GLOVE) ×3 IMPLANT
GLOVE BIOGEL PI IND STRL 8 (GLOVE) ×1 IMPLANT
GLOVE BIOGEL PI IND STRL 9 (GLOVE) ×1 IMPLANT
GLOVE BIOGEL PI INDICATOR 8 (GLOVE) ×2
GLOVE BIOGEL PI INDICATOR 9 (GLOVE) ×2
GOWN STRL REUS W/ TWL LRG LVL3 (GOWN DISPOSABLE) ×1 IMPLANT
GOWN STRL REUS W/ TWL XL LVL3 (GOWN DISPOSABLE) ×2 IMPLANT
GOWN STRL REUS W/TWL LRG LVL3 (GOWN DISPOSABLE) ×2
GOWN STRL REUS W/TWL XL LVL3 (GOWN DISPOSABLE) ×4
HANDPIECE INTERPULSE COAX TIP (DISPOSABLE) ×2
HOOD PEEL AWAY FACE SHEILD DIS (HOOD) ×6 IMPLANT
KIT BASIN OR (CUSTOM PROCEDURE TRAY) ×3 IMPLANT
KIT ROOM TURNOVER OR (KITS) ×3 IMPLANT
MANIFOLD NEPTUNE II (INSTRUMENTS) ×3 IMPLANT
NEEDLE SPNL 18GX3.5 QUINCKE PK (NEEDLE) IMPLANT
NS IRRIG 1000ML POUR BTL (IV SOLUTION) ×3 IMPLANT
PACK TOTAL JOINT (CUSTOM PROCEDURE TRAY) ×3 IMPLANT
PAD ARMBOARD 7.5X6 YLW CONV (MISCELLANEOUS) ×6 IMPLANT
SET HNDPC FAN SPRY TIP SCT (DISPOSABLE) ×1 IMPLANT
SPONGE LAP 18X18 X RAY DECT (DISPOSABLE) ×3 IMPLANT
SUT VIC AB 0 CT1 27 (SUTURE) ×2
SUT VIC AB 0 CT1 27XBRD ANBCTR (SUTURE) ×1 IMPLANT
SUT VIC AB 1 CTX 36 (SUTURE) ×2
SUT VIC AB 1 CTX36XBRD ANBCTR (SUTURE) ×1 IMPLANT
SUT VIC AB 2-0 CT1 27 (SUTURE)
SUT VIC AB 2-0 CT1 TAPERPNT 27 (SUTURE) IMPLANT
SUT VIC AB 3-0 CT1 27 (SUTURE) ×2
SUT VIC AB 3-0 CT1 TAPERPNT 27 (SUTURE) ×1 IMPLANT
SYR 50ML LL SCALE MARK (SYRINGE) ×3 IMPLANT
TOWEL OR 17X24 6PK STRL BLUE (TOWEL DISPOSABLE) ×3 IMPLANT
TOWEL OR 17X26 10 PK STRL BLUE (TOWEL DISPOSABLE) ×3 IMPLANT
TRAY CATH 16FR W/PLASTIC CATH (SET/KITS/TRAYS/PACK) ×3 IMPLANT
WATER STERILE IRR 1000ML POUR (IV SOLUTION) IMPLANT

## 2015-05-22 NOTE — Anesthesia Preprocedure Evaluation (Addendum)
Anesthesia Evaluation  Patient identified by MRN, date of birth, ID band Patient awake    Airway Mallampati: II  TM Distance: >3 FB Neck ROM: Full    Dental   Pulmonary sleep apnea , former smoker,    breath sounds clear to auscultation       Cardiovascular hypertension, + Peripheral Vascular Disease   Rhythm:Regular Rate:Normal     Neuro/Psych  Headaches,    GI/Hepatic negative GI ROS, Neg liver ROS,   Endo/Other  negative endocrine ROS  Renal/GU negative Renal ROS     Musculoskeletal  (+) Arthritis ,   Abdominal   Peds  Hematology History noted. CE   Anesthesia Other Findings   Reproductive/Obstetrics                            Anesthesia Physical Anesthesia Plan  ASA: III  Anesthesia Plan: Spinal   Post-op Pain Management:    Induction: Intravenous  Airway Management Planned: Simple Face Mask  Additional Equipment:   Intra-op Plan:   Post-operative Plan:   Informed Consent: I have reviewed the patients History and Physical, chart, labs and discussed the procedure including the risks, benefits and alternatives for the proposed anesthesia with the patient or authorized representative who has indicated his/her understanding and acceptance.   Dental advisory given  Plan Discussed with: CRNA and Anesthesiologist  Anesthesia Plan Comments:         Anesthesia Quick Evaluation

## 2015-05-22 NOTE — Discharge Instructions (Signed)

## 2015-05-22 NOTE — Progress Notes (Signed)
Orthopedic Tech Progress Note Patient Details:  Dennis Martin 11-14-1955 [id number] Viewed order from doctor's order list CPM Right Knee CPM Right Knee: On Right Knee Flexion (Degrees): 40 Right Knee Extension (Degrees): 0 Additional Comments: trapeze bar patient helper   [name] 05/22/2015, 9:48 AM

## 2015-05-22 NOTE — Progress Notes (Signed)
Orthopedic Tech Progress Note Patient Details:  Dennis Martin April 04, 1955 [id number] On cpm at [facility] Patient ID: [name], male   DOB: 09-Dec-1955, 60 y.o.   MRN: [id number]   [name] 05/22/2015, 6:31 PM

## 2015-05-22 NOTE — Transfer of Care (Signed)
Immediate Anesthesia Transfer of Care Note  Patient: Dennis Martin  Procedure(s) Performed: Procedure(s): TOTAL KNEE ARTHROPLASTY (Right)  Patient Location: PACU  Anesthesia Type:Spinal  Level of Consciousness: awake, alert , oriented and patient cooperative  Airway & Oxygen Therapy: Patient Spontanous Breathing and Patient connected to nasal cannula oxygen  Post-op Assessment: Report given to RN, Post -op Vital signs reviewed and stable, Patient moving all extremities X 4 and Patient able to stick tongue midline  Post vital signs: Reviewed and stable  Last Vitals:  Filed Vitals:   05/22/15 0620  BP: 151/86  Pulse: 69  Temp: 36.7 C  Resp: 18    Complications: No apparent anesthesia complications

## 2015-05-22 NOTE — Interval H&P Note (Signed)
History and Physical Interval Note:  05/22/2015 7:10 AM  Dennis Martin  has presented today for surgery, with the diagnosis of RIGHT KNEE OSTEOARTHRITIS  The various methods of treatment have been discussed with the patient and family. After consideration of risks, benefits and other options for treatment, the patient has consented to  Procedure(s): TOTAL KNEE ARTHROPLASTY (Right) as a surgical intervention .  The patient's history has been reviewed, patient examined, no change in status, stable for surgery.  I have reviewed the patient's chart and labs.  Questions were answered to the patient's satisfaction.     Kerin Salen

## 2015-05-22 NOTE — Care Management (Signed)
Utilization review completed. Iban Utz, RN Case Manager [phone]. 

## 2015-05-22 NOTE — Anesthesia Postprocedure Evaluation (Signed)
Anesthesia Post Note  Patient: Dennis Martin  Procedure(s) Performed: Procedure(s) (LRB): TOTAL KNEE ARTHROPLASTY (Right)  Patient location during evaluation: PACU Anesthesia Type: Spinal Level of consciousness: awake Pain management: pain level controlled Vital Signs Assessment: post-procedure vital signs reviewed and stable Respiratory status: spontaneous breathing Cardiovascular status: stable Postop Assessment: spinal receding Anesthetic complications: no    Last Vitals:  Filed Vitals:   05/22/15 1016 05/22/15 1050  BP: 143/84 143/92  Pulse: 48 54  Temp: 37.1 C 36.4 C  Resp: 11 16    Last Pain:  Filed Vitals:   05/22/15 1147  PainSc: 6                  EDWARDS,Samik Balkcom

## 2015-05-22 NOTE — Progress Notes (Signed)
Occupational Therapy Evaluation Patient Details Name: Dennis Martin MRN: [id number] DOB: May 10, 1955 Today's Date: 05/22/2015    History of Present Illness Admitted for RTKA;  has a past medical history of BPH (benign prostatic hyperplasia); Hypertension; Essential thrombocytosis ([place]); History of stress test (2008); Peripheral vascular disease ([place]); DVT of leg (deep venous thrombosis) ([place]) (2001); Sleep apnea; Spleen enlarged; Headache; Thrombocythemia ([place]); and Arthritis.  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   Clinical Impression   PTA, pt was independent with ADLs and mobility. Pt currently requires min assist for LB ADLs and min guard for functional transfers. Pt very anxious to get up and moving and "heal fast" - began education on gradually increasing activity level and maintaining safety at all times. Pt plans to d/c home with 24/7 assistance from his wife. Pt will benefit from continued acute OT to increase independence and safety with ADLs and mobility to allow for safe discharge home. No OT follow up recommended - 3in1 and RW recommended for home use.    Follow Up Recommendations  No OT follow up;Supervision/Assistance - 24 hour    Equipment Recommendations  3 in 1 bedside comode;Other (comment) (RW-2 wheeled)    Recommendations for Other Services       Precautions / Restrictions Precautions Precautions: Knee Precaution Booklet Issued: No Precaution Comments: Reviewed not placing pillow, ice pack or other objects under R knee Restrictions Weight Bearing Restrictions: Yes RLE Weight Bearing: Weight bearing as tolerated      Mobility Bed Mobility Overal bed mobility: Needs Assistance Bed Mobility: Supine to Sit     Supine to sit: Min guard     General bed mobility comments: Pt up in chair on OT  arrival  Transfers Overall transfer level: Needs assistance Equipment used: Rolling walker (2 wheeled) Transfers: Sit to/from Stand Sit to Stand: Min guard         General transfer comment: Min guard for safety and balance. Verbal cues for safe hand placement and to attend to task at hand    Balance Overall balance assessment: Needs assistance Sitting-balance support: No upper extremity supported;Feet supported Sitting balance-Leahy Scale: Good     Standing balance support: Bilateral upper extremity supported;During functional activity Standing balance-Leahy Scale: Fair Standing balance comment: Able to complete grooming tasks at sink without UE support for brief period of time                            ADL Overall ADL's : Needs assistance/impaired     Grooming: Wash/dry hands;Min guard;Standing           Upper Body Dressing : Set up;Sitting   Lower Body Dressing: Minimal assistance;Sit to/from stand;Cueing for compensatory techniques Lower Body Dressing Details (indicate cue type and reason): Cues to dress RLE first and undress it last Toilet Transfer: Min guard;Ambulation;BSC;RW   Toileting- Water quality scientist and Hygiene: Min guard;Sit to/from stand       Functional mobility during ADLs: Min guard;Rolling walker General ADL Comments: Pt very talkative and sometimes interrupts with unrelated questions to task at hand and requires some redirection. Began education on energy cosnervation and gradually increasing activity level as pt is very anxious to get up and moving and "heal fast."Wife present for OT eval     Vision Vision Assessment?: No apparent visual deficits   Perception     Praxis  Pertinent Vitals/Pain Pain Assessment: 0-10 Pain Score: 7  Pain Location: R knee Pain Descriptors / Indicators: Aching Pain Intervention(s): Limited activity within patient's tolerance;Monitored during session;Repositioned;Ice applied     Hand  Dominance Right   Extremity/Trunk Assessment Upper Extremity Assessment Upper Extremity Assessment: Overall WFL for tasks assessed   Lower Extremity Assessment Lower Extremity Assessment: RLE deficits/detail RLE Deficits / Details: Decreased AROM and strength as expected post op   Cervical / Trunk Assessment Cervical / Trunk Assessment: Normal   Communication Communication Communication: No difficulties   Cognition Arousal/Alertness: Awake/alert Behavior During Therapy: WFL for tasks assessed/performed;Anxious Overall Cognitive Status: Within Functional Limits for tasks assessed                     General Comments       Exercises Exercises: Total Joint     Shoulder Instructions      Home Living Family/patient expects to be discharged to:: Private residence Living Arrangements: Spouse/significant other Available Help at Discharge: Family;Available 24 hours/day Type of Home: House Home Access: Stairs to enter [employer] of Steps: 1   Home Layout: One level     Bathroom Shower/Tub: Walk-in shower;Door   [employer] Toilet: Standard     Home Equipment: None          Prior Functioning/Environment Level of Independence: Independent             OT Diagnosis: Acute pain   OT Problem List: Decreased strength;Decreased range of motion;Impaired balance (sitting and/or standing);Decreased activity tolerance;Decreased coordination;Decreased safety awareness;Decreased knowledge of use of DME or AE;Decreased knowledge of precautions;Pain   OT Treatment/Interventions: Self-care/ADL training;Therapeutic exercise;DME and/or AE instruction;Energy conservation;Therapeutic activities;Patient/family education;Balance training    OT Goals(Current goals can be found in the care plan section) Acute Rehab OT Goals Patient Stated Goal: to get back to doing all the things I love to do OT Goal Formulation: With patient Time For Goal Achievement:  06/05/15 Potential to Achieve Goals: Good ADL Goals Pt Will Transfer to Toilet: with supervision;ambulating;bedside commode Pt Will Perform Toileting - Clothing Manipulation and hygiene: with supervision;sit to/from stand;sitting/lateral leans Pt Will Perform Tub/Shower Transfer: Shower transfer;ambulating;3 in 1;rolling walker  OT Frequency: Min 2X/week   Barriers to D/C:            Co-evaluation              End of Session Equipment Utilized During Treatment: Gait belt;Rolling walker CPM Right Knee CPM Right Knee: Off Nurse Communication: Mobility status;Other (comment) (Pt will need more time in CPM after dinner)  Activity Tolerance: Patient tolerated treatment well Patient left: in chair;with call bell/phone within reach;with family/visitor present   Time: [id number] OT Time Calculation (min): 22 min Charges:  OT General Charges $OT Visit: 1 Procedure OT Evaluation $OT Eval Moderate Complexity: 1 Procedure G-Codes:    Redmond Baseman, OTR/L [phone] 05/22/2015, 5:30 PM

## 2015-05-22 NOTE — Op Note (Signed)
PATIENT ID:      Dennis Martin  MRN:     [id number] DOB/AGE:    06-24-1955 / 60 y.o.       OPERATIVE REPORT    DATE OF PROCEDURE:  05/22/2015       PREOPERATIVE DIAGNOSIS:   RIGHT KNEE OSTEOARTHRITIS      Estimated body mass index is 25.98 kg/(m^2) as calculated from the following:   Height as of this encounter: 5\' 11"  (1.803 m).   Weight as of this encounter: 84.46 kg (186 lb 3.2 oz).                                                        POSTOPERATIVE DIAGNOSIS:   RIGHT KNEE OSTEOARTHRITIS                                                                      PROCEDURE:  Procedure(s): TOTAL KNEE ARTHROPLASTY Using DepuyAttune RP implants #8R Femur, #9Tibia, 6 mm Attune RP bearing, 41 Patella     SURGEON: Cordai Rodrigue J    ASSISTANT:   Eric K. [employer]   (Present and scrubbed throughout the case, critical for assistance with exposure, retraction, instrumentation, and closure.)         ANESTHESIA: Spinal, 20cc Exparel, 20cc 0.5% Marcaine  EBL: 300  FLUID REPLACEMENT: 1600 crystalloid  TOURNIQUET TIME: 6min  Drains: None  Tranexamic Acid: 2gm topical   COMPLICATIONS:  None         INDICATIONS FOR PROCEDURE: The patient has  RIGHT KNEE OSTEOARTHRITIS, Var deformities, XR shows bone on bone arthritis, lateral subluxation of tibia. Patient has failed all conservative measures including anti-inflammatory medicines, narcotics, attempts at  exercise and weight loss, cortisone injections and viscosupplementation.  Risks and benefits of surgery have been discussed, questions answered.   DESCRIPTION OF PROCEDURE: The patient identified by armband, received  IV antibiotics, in the holding area at [facility]. Patient taken to the operating room, appropriate anesthetic  monitors were attached, and Spinal anesthesia was  induced. Tourniquet  applied high to the operative thigh. Lateral post and foot positioner  applied to the table, the lower extremity was then prepped and draped  in  usual sterile fashion from the toes to the tourniquet. Time-out procedure was performed. We began the operation, with the knee flexed 120 degrees, by making the anterior midline incision starting at handbreadth above the patella going over the patella 1 cm medial to and 4 cm distal to the tibial tubercle. Small bleeders in the skin and the  subcutaneous tissue identified and cauterized. Transverse retinaculum was incised and reflected medially and a medial parapatellar arthrotomy was accomplished. the patella was everted and theprepatellar fat pad resected. The superficial medial collateral  ligament was then elevated from anterior to posterior along the proximal  flare of the tibia and anterior half of the menisci resected. The knee was hyperflexed exposing bone on bone arthritis. Peripheral and notch osteophytes as well as the cruciate ligaments were then resected. We continued to  work our way around posteriorly along the proximal tibia,  and externally  rotated the tibia subluxing it out from underneath the femur. A McHale  retractor was placed through the notch and a lateral Hohmann retractor  placed, and we then drilled through the proximal tibia in line with the  axis of the tibia followed by an intramedullary guide rod and 2-degree  posterior slope cutting guide. The tibial cutting guide, 3 degree posterior sloped, was pinned into place allowing resection of 4 mm of bone medially and 12 mm of bone laterally. Satisfied with the tibial resection, we then  entered the distal femur 2 mm anterior to the PCL origin with the  intramedullary guide rod and applied the distal femoral cutting guide  set at 9 mm, with 5 degrees of valgus. This was pinned along the  epicondylar axis. At this point, the distal femoral cut was accomplished without difficulty. We then sized for a #8R femoral component and pinned the guide in 3 degrees of external rotation. The chamfer cutting guide was pinned into place. The  anterior, posterior, and chamfer cuts were accomplished without difficulty followed by  the Attune RP box cutting guide and the box cut. We also removed posterior osteophytes from the posterior femoral condyles. At this  time, the knee was brought into full extension. We checked our  extension and flexion gaps and found them symmetric for a 6 mm bearing. Distracting in extension with a lamina spreader, the posterior horns of the menisci were removed, and Exparel, diluted to 60 cc, with 20cc NS, and 20cc 0.5% Marcaine,was injected into the capsule and synovium of the knee. The posterior patella cut was accomplished with the 9.5 mm Attune cutting guide, sized for a 60mm dome, and the fixation pegs drilled.The knee  was then once again hyperflexed exposing the proximal tibia. We sized for a # 9 tibial base plate, applied the smokestack and the conical reamer followed by the the Delta fin keel punch. We then hammered into place the Attune RP trial femoral component, drilled the lugs, inserted a  6 mm trial bearing, trial patellar button, and took the knee through range of motion from 0-130 degrees. No thumb pressure was required for patellar Tracking. At this point, the limb was wrapped with an Esmarch bandage and the tourniquet inflated to 350 mmHg. All trial components were removed, mating surfaces irrigated with pulse lavage, and dried with suction and sponges. A double batch of DePuy HV cement with 1500 mg of Zinacef was mixed and applied to all bony metallic mating surfaces except for the posterior condyles of the femur itself. In order, we  hammered into place the tibial tray and removed excess cement, the femoral component and removed excess cement. The final Attune RP bearing  was inserted, and the knee brought to full extension with compression.  The patellar button was clamped into place, and excess cement  removed. While the cement cured the wound was irrigated out with normal saline solution pulse  lavage. Ligament stability and patellar tracking were checked and found to be excellent. The parapatellar arthrotomy was closed with  running #1 Vicryl suture. The subcutaneous tissue with 0 and 2-0 undyed  Vicryl suture, and the skin with running 3-0 SQ vicryl. A dressing of Xeroform,  4 x 4, dressing sponges, Webril, and Ace wrap applied. The patient  awakened, and taken to recovery room without difficulty.   Fareeha Evon J 05/22/2015, 9:03 AM

## 2015-05-22 NOTE — Anesthesia Procedure Notes (Signed)
Spinal Patient location during procedure: OR Start time: 05/22/2015 7:25 AM End time: 05/22/2015 7:35 AM Staffing Anesthesiologist: Finis Bud Performed by: anesthesiologist  Preanesthetic Checklist Completed: patient identified, site marked, surgical consent, pre-op evaluation, timeout performed, IV checked, risks and benefits discussed and monitors and equipment checked Spinal Block Patient position: sitting Prep: Betadine Patient monitoring: heart rate, cardiac monitor, continuous pulse ox and blood pressure Location: L3-4 Injection technique: single-shot Needle Needle type: Quincke  Needle gauge: 22 G Assessment Sensory level: T10 Additional Notes Clear CSF. 11.25 spinal marcaine. No diff

## 2015-05-22 NOTE — Evaluation (Signed)
Physical Therapy Evaluation Patient Details Name: Dennis Martin MRN: [id number] DOB: [birth year] Today's Date: 05/22/2015   History of Present Illness  Admitted for RTKA;  has a past medical history of BPH (benign prostatic hyperplasia); Hypertension; Essential thrombocytosis ([place]); History of stress test (2008); Peripheral vascular disease ([place]); DVT of leg (deep venous thrombosis) ([place]) (2001); Sleep apnea; Spleen enlarged; Headache; Thrombocythemia ([place]); and Arthritis.  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  Clinical Impression   Pt is s/p TKA resulting in the deficits listed below (see PT Problem List).  Pt will benefit from skilled PT to increase their independence and safety with mobility to allow discharge to the venue listed below.      Follow Up Recommendations Home health PT;Supervision - Intermittent    Equipment Recommendations  Rolling walker with 5" wheels;3in1 (PT)    Recommendations for Other Services       Precautions / Restrictions Precautions Precautions: Knee Precaution Comments: Pt educated to not allow any pillow or bolster under knee for healing with optimal range of motion.  Restrictions Weight Bearing Restrictions: Yes RLE Weight Bearing: Weight bearing as tolerated      Mobility  Bed Mobility Overal bed mobility: Needs Assistance Bed Mobility: Supine to Sit     Supine to sit: Min guard     General bed mobility comments: Cues for technqiue  Transfers Overall transfer level: Needs assistance Equipment used: Rolling walker (2 wheeled) Transfers: Sit to/from Stand Sit to Stand: Min guard         General transfer comment: Cues for hand placement, safety, and technique  Ambulation/Gait Ambulation/Gait assistance: Min guard Ambulation Distance (Feet): 8 Feet Assistive device: Rolling  walker (2 wheeled) Gait Pattern/deviations: Step-to pattern     General Gait Details: Cues for gait sequence and technqiue; nice, stabel knee in stance  Stairs            Wheelchair Mobility    Modified Rankin (Stroke Patients Only)       Balance                                             Pertinent Vitals/Pain Pain Assessment: 0-10 Pain Score: 5  Pain Location: R knee Pain Descriptors / Indicators: Aching Pain Intervention(s): Limited activity within patient's tolerance;Monitored during session    Home Living Family/patient expects to be discharged to:: Private residence Living Arrangements: Spouse/significant other Available Help at Discharge: Family;Available PRN/intermittently Type of Home: House Home Access: Stairs to enter   Entrance Stairs-Number of Steps: 1 Home Layout: One level Home Equipment: None      Prior Function Level of Independence: Independent               Hand Dominance        Extremity/Trunk Assessment   Upper Extremity Assessment: Overall WFL for tasks assessed           Lower Extremity Assessment: RLE deficits/detail RLE Deficits / Details: Grossly decr AROM and muscle activation, limited by pain; noted good control with straight leg raise       Communication   Communication: No difficulties  Cognition Arousal/Alertness: Awake/alert Behavior During Therapy: WFL for tasks assessed/performed Overall Cognitive Status: Within Functional Limits for tasks assessed  General Comments      Exercises Total Joint Exercises Quad Sets: AROM;Right;10 reps Heel Slides: AAROM;Right;5 reps      Assessment/Plan    PT Assessment Patient needs continued PT services  PT Diagnosis Difficulty walking;Acute pain   PT Problem List Decreased strength;Decreased range of motion;Decreased activity tolerance;Decreased knowledge of use of DME;Decreased knowledge of precautions;Pain  PT  Treatment Interventions DME instruction;Gait training;Stair training;Functional mobility training;Therapeutic activities;Therapeutic exercise;Patient/family education   PT Goals (Current goals can be found in the Care Plan section) Acute Rehab PT Goals Patient Stated Goal: to walk PT Goal Formulation: With patient Time For Goal Achievement: 05/29/15 Potential to Achieve Goals: Good    Frequency 7X/week   Barriers to discharge        Co-evaluation               End of Session Equipment Utilized During Treatment: Gait belt Activity Tolerance: Patient tolerated treatment well Patient left: in chair;with call bell/phone within reach;with family/visitor present Nurse Communication: Mobility status         Time: [id number] PT Time Calculation (min) (ACUTE ONLY): 36 min   Charges:   PT Evaluation $PT Eval Moderate Complexity: 1 Procedure PT Treatments $Gait Training: 8-22 mins   PT G Codes:        Quin Hoop 05/22/2015, 3:59 PM  Roney Marion, [place] Pager [phone] Office [phone]

## 2015-05-22 NOTE — Care Management Note (Signed)
Case Management Note  Patient Details  Name: Rod Venturino MRN: [id number] Date of Birth: [birth year]  Subjective/Objective:  60 yr old gentleman s/p right total knee arthroplasty.                  Action/Plan: Case manager spoke with patient and his wife at the bedside concerning home health and DME needs at discharge. Choice was offered , referral was called to Christa See, [place] Liaison. Patient will have family support at discharge.    Expected Discharge Date:   05/23/15               Expected Discharge Plan:  [place]  In-House Referral:     Discharge planning Services  CM Consult  Post Acute Care Choice:  Durable Medical Equipment, Home Health Choice offered to:  Patient, Spouse  DME Arranged:  3-N-1, Walker rolling, CPM DME Agency:   [facility]   HH Arranged:  PT HH Agency:  [facility]  Status of Service:  Completed  Medicare Important Message Given:    Date Medicare IM Given:    Medicare IM give by:    Date Additional Medicare IM Given:    Additional Medicare Important Message give by:     If discussed at [place] of Stay Meetings, dates discussed:    Additional Comments:  Ninfa Meeker, RN 05/22/2015, 2:04 PM

## 2015-05-23 ENCOUNTER — Encounter (HOSPITAL_COMMUNITY): Payer: Self-pay | Admitting: Orthopedic Surgery

## 2015-05-23 LAB — BASIC METABOLIC PANEL
Anion gap: 6 (ref 5–15)
BUN: 12 mg/dL (ref 6–20)
CALCIUM: 8.2 mg/dL — AB (ref 8.9–10.3)
CO2: 27 mmol/L (ref 22–32)
CREATININE: 0.84 mg/dL (ref 0.61–1.24)
Chloride: 97 mmol/L — ABNORMAL LOW (ref 101–111)
GFR calc Af Amer: >60 mL/min (ref >60)
Glucose, Bld: 144 mg/dL — ABNORMAL HIGH (ref 65–99)
POTASSIUM: 4.4 mmol/L (ref 3.5–5.1)
SODIUM: 130 mmol/L — AB (ref 135–145)

## 2015-05-23 LAB — CBC
HCT: 38.1 % — ABNORMAL LOW (ref 39.0–52.0)
Hemoglobin: 13 g/dL (ref 13.0–17.0)
MCH: 35.8 pg — AB (ref 26.0–34.0)
MCHC: 34.1 g/dL (ref 30.0–36.0)
MCV: 105 fL — ABNORMAL HIGH (ref 78.0–100.0)
PLATELETS: 395 10*3/uL (ref 150–400)
RBC: 3.63 MIL/uL — AB (ref 4.22–5.81)
RDW: 13 % (ref 11.5–15.5)
WBC: 14.6 10*3/uL — ABNORMAL HIGH (ref 4.0–10.5)

## 2015-05-23 NOTE — Progress Notes (Signed)
Orthopedic Tech Progress Note Patient Details:  Dennis Martin Aug 14, 1955 [id number]  Patient ID: Dennis Martin, male   DOB: [birth year], 60 y.o.   MRN: [id number] Placed pt's rle on cpm @0 -40 degrees @1350   [name] 05/23/2015, 1:46 PM

## 2015-05-23 NOTE — Progress Notes (Signed)
Physical Therapy Treatment Patient Details Name: Dennis Martin MRN: [id number] DOB: [birth year] Today's Date: 05/23/2015    History of Present Illness Admitted for RTKA;  has a past medical history of BPH (benign prostatic hyperplasia); Hypertension; Essential thrombocytosis ([place]); History of stress test (2008); Peripheral vascular disease ([place]); DVT of leg (deep venous thrombosis) ([place]) (2001); Sleep apnea; Spleen enlarged; Headache; Thrombocythemia ([place]); and Arthritis.  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    PT Comments    Continuing progress; Wife and Pt's questions re: dc'ing home answered; OK for dc home from PT standpoint   Follow Up Recommendations  Home health PT;Supervision - Intermittent     Equipment Recommendations  Rolling walker with 5" wheels;3in1 (PT)    Recommendations for Other Services       Precautions / Restrictions Precautions Precautions: Knee Precaution Booklet Issued: Yes (comment) Precaution Comments: Reviewed not placing pillow, ice pack or other objects under R knee Restrictions Weight Bearing Restrictions: Yes RLE Weight Bearing: Weight bearing as tolerated    Mobility  Bed Mobility Overal bed mobility: Needs Assistance Bed Mobility: Supine to Sit     Supine to sit: Supervision Sit to supine: Supervision   General bed mobility comments: Cues for technique  Transfers Overall transfer level: Needs assistance Equipment used: Rolling walker (2 wheeled) Transfers: Sit to/from Stand Sit to Stand: Supervision         General transfer comment: Min guard for safety and balance. Verbal cues for safe hand placement  Ambulation/Gait Ambulation/Gait assistance: Supervision Ambulation Distance (Feet): 100 Feet (x2) Assistive device: Rolling walker (2 wheeled) Gait  Pattern/deviations: Step-through pattern     General Gait Details: Cues for gait sequence and technqiue; nice, stable knee in stance, but with one episode of buckling that pt was able to catch himself on RW without physical assist; discussed working up to freely bending R knee in swing   Stairs            Wheelchair Mobility    Modified Rankin (Stroke Patients Only)       Balance Overall balance assessment: Needs assistance Sitting-balance support: No upper extremity supported;Feet supported Sitting balance-Leahy Scale: Good     Standing balance support: Bilateral upper extremity supported;During functional activity Standing balance-Leahy Scale: Fair                      Cognition Arousal/Alertness: Awake/alert Behavior During Therapy: WFL for tasks assessed/performed Overall Cognitive Status: Within Functional Limits for tasks assessed                      Exercises Total Joint Exercises Quad Sets: AROM;Right;20 reps (Will plan for therex next session if he has not dc'd) Short Arc Quad: AROM;AAROM;Right;10 reps Heel Slides: AROM;AAROM;Right;10 reps Straight Leg Raises: AROM;Right;10 reps Goniometric ROM: approx 2-85 deg    General Comments        Pertinent Vitals/Pain Pain Assessment: 0-10 Pain Score: 5  Pain Location: R knee Pain Descriptors / Indicators: Aching Pain Intervention(s): Limited activity within patient's tolerance    Home Living                      Prior Function            PT Goals (current goals can now be found in the care plan section) Acute Rehab PT Goals Patient Stated  Goal: to get back to doing all the things I love to do PT Goal Formulation: With patient Time For Goal Achievement: 05/29/15 Potential to Achieve Goals: Good Progress towards PT goals: Progressing toward goals    Frequency  7X/week    PT Plan Current plan remains appropriate    Co-evaluation             End of Session  Equipment Utilized During Treatment: Gait belt Activity Tolerance: Patient tolerated treatment well Patient left: in chair;with call bell/phone within reach;with family/visitor present     Time: [id number] PT Time Calculation (min) (ACUTE ONLY): 44 min  Charges:  $Gait Training: 8-22 mins $Therapeutic Exercise: 8-22 mins $Therapeutic Activity: 8-22 mins                    G Codes:      Quin Hoop 05/23/2015, 3:50 PM  Roney Marion, [place] Pager [phone] Office [phone]

## 2015-05-23 NOTE — Progress Notes (Signed)
Patient ID: Dennis Martin, male   DOB: [birth year], 60 y.o.   MRN: [id number] PATIENT ID: Dennis Martin  MRN: [id number]  DOB/AGE:  Nov 21, 1955 / 60 y.o.  1 Day Post-Op Procedure(s) (LRB): TOTAL KNEE ARTHROPLASTY (Right)    PROGRESS NOTE Subjective: Patient is alert, oriented, no Nausea, no Vomiting, yes passing gas. Taking PO well. Denies SOB, Chest or Calf Pain. Using Incentive Spirometer, PAS in place. Ambulate WBAT, CPM 0-40 Patient reports pain as 3/10 .    Objective: Vital signs in last 24 hours: Filed Vitals:   05/22/15 1050 05/22/15 2118 05/23/15 0109 05/23/15 0448  BP: 143/92 107/66 113/68 132/63  Pulse: 54 72 71 69  Temp: 97.6 F (36.4 C) 97.5 F (36.4 C) 97.5 F (36.4 C) 97.7 F (36.5 C)  TempSrc:  Oral Oral Oral  Resp: 16 15 14 15   Height:      Weight:      SpO2: 100% 98% 98% 97%      Intake/Output from previous day: I/O last 3 completed shifts: In: 1800 [I.V.:1800] Out: 2050 [Urine:1875; Blood:175]   Intake/Output this shift:     LABORATORY DATA: No results for input(s): WBC, HGB, HCT, PLT, NA, K, CL, CO2, BUN, CREATININE, GLUCOSE, GLUCAP, INR, CALCIUM in the last 72 hours.  Invalid input(s): PT, 2  Examination: Neurologically intact ABD soft Neurovascular intact Sensation intact distally Intact pulses distally Dorsiflexion/Plantar flexion intact Incision: dressing C/D/I No cellulitis present Compartment soft}  Assessment:   1 Day Post-Op Procedure(s) (LRB): TOTAL KNEE ARTHROPLASTY (Right) ADDITIONAL DIAGNOSIS: Expected Acute Blood Loss Anemia, Hypertension  Plan: PT/OT WBAT, CPM 5/hrs day until ROM 0-90 degrees, then D/C CPM DVT Prophylaxis:  SCDx72hrs, ASA 325 mg BID x 2 weeks DISCHARGE PLAN: Home, when passes PT DISCHARGE NEEDS: HHPT, CPM, Walker and 3-in-1 comode seat     Rayola Everhart J 05/23/2015, 7:56 AM

## 2015-05-23 NOTE — Progress Notes (Signed)
Occupational Therapy Treatment/Discharge Patient Details Name: Dennis Martin MRN: [id number] DOB: Jul 29, 1955 Today's Date: 05/23/2015    History of present illness Admitted for RTKA;  has a past medical history of BPH (benign prostatic hyperplasia); Hypertension; Essential thrombocytosis ([place]); History of stress test (2008); Peripheral vascular disease ([place]); DVT of leg (deep venous thrombosis) ([place]) (2001); Sleep apnea; Spleen enlarged; Headache; Thrombocythemia ([place]); and Arthritis.  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   OT comments  Patient making good progress towards occupational therapy goals. Pt completed all ADLs and functional transfers at supervision level. Completed education on compensatory strategies for ADLs, energy conservation, pain/edema management, home safety, and fall prevention strategies. All education has been completed and pt has no further questions. Pt is adequate for discharge from occupational therapy standpoint. OT signing off.   Follow Up Recommendations  No OT follow up;Supervision/Assistance - 24 hour    Equipment Recommendations  3 in 1 bedside comode;Other (comment)    Recommendations for Other Services      Precautions / Restrictions Precautions Precautions: Knee Precaution Booklet Issued: No Precaution Comments: Reviewed not placing pillow, ice pack or other objects under R knee Restrictions Weight Bearing Restrictions: Yes RLE Weight Bearing: Weight bearing as tolerated       Mobility Bed Mobility Overal bed mobility: Needs Assistance Bed Mobility: Supine to Sit;Sit to Supine     Supine to sit: Supervision Sit to supine: Supervision   General bed mobility comments: Supervision for safety. Good demonstration of hooking L foot under R ankle to move RLE on/off  bed  Transfers Overall transfer level: Needs assistance Equipment used: Rolling walker (2 wheeled) Transfers: Sit to/from Stand Sit to Stand: Supervision         General transfer comment: supervision for safety. Good demonstration of safe hand placement on seated surfaces    Balance Overall balance assessment: Needs assistance Sitting-balance support: No upper extremity supported;Feet supported Sitting balance-Leahy Scale: Good     Standing balance support: Bilateral upper extremity supported;During functional activity Standing balance-Leahy Scale: Fair                     ADL Overall ADL's : Needs assistance/impaired     Grooming: Wash/dry hands;Supervision/safety;Standing               Lower Body Dressing: Set up;Sitting/lateral leans   Toilet Transfer: Supervision/safety;Ambulation;BSC;RW Toilet Transfer Details (indicate cue type and reason): BSC over toilet Toileting- Clothing Manipulation and Hygiene: Supervision/safety;Sit to/from stand   Tub/ Shower Transfer: Walk-in shower;Supervision/safety;Cueing for sequencing;Ambulation;Rolling walker Tub/Shower Transfer Details (indicate cue type and reason): Cues for proper step sequence with RW Functional mobility during ADLs: Supervision/safety;Rolling walker General ADL Comments: Completed education on compensatory strategies for ADLs, pain/edema management, energy conservation, and fall prevention.       Vision                     Perception     Praxis      Cognition   Behavior During Therapy: [facility] for tasks assessed/performed Overall Cognitive Status: Within Functional Limits for tasks assessed                       Extremity/Trunk Assessment               Exercises Total Joint Exercises Quad Sets:  (Will plan for therex next session  if he has not dc'd)   Shoulder Instructions       General Comments      Pertinent Vitals/ Pain       Pain Assessment: 0-10 Pain  Score: 5  Pain Location: R knee and thigh Pain Descriptors / Indicators: Aching Pain Intervention(s): Limited activity within patient's tolerance;Monitored during session;Repositioned;Ice applied;Premedicated before session  Home Living                                          Prior Functioning/Environment              Frequency       Progress Toward Goals  OT Goals(current goals can now be found in the care plan section)  Progress towards OT goals: Goals met/education completed, patient discharged from OT  Acute Rehab OT Goals Patient Stated Goal: to get back to doing all the things I love to do OT Goal Formulation: With patient Time For Goal Achievement: 06/05/15 Potential to Achieve Goals: Good ADL Goals Pt Will Transfer to Toilet: with supervision;ambulating;bedside commode Pt Will Perform Toileting - Clothing Manipulation and hygiene: with supervision;sit to/from stand;sitting/lateral leans Pt Will Perform Tub/Shower Transfer: Shower transfer;ambulating;3 in 1;rolling walker  Plan All goals met and education completed, patient discharged from OT services    Co-evaluation                 End of Session Equipment Utilized During Treatment: Gait belt;Rolling walker CPM Right Knee CPM Right Knee: Off   Activity Tolerance Patient tolerated treatment well   Patient Left in chair;with call bell/phone within reach;Other (comment) (0 bone foam applied)   Nurse Communication Mobility status        Time: 3953-2023 OT Time Calculation (min): 26 min  Charges: OT General Charges $OT Visit: 1 Procedure OT Treatments $Self Care/Home Management : 23-37 mins  Redmond Baseman, OTR/L Pager: [phone] 05/23/2015, 12:32 PM

## 2015-05-23 NOTE — Progress Notes (Signed)
Physical Therapy Treatment Patient Details Name: Jolan Wardrop MRN: [id number] DOB: March 19, 1956 Today's Date: 05/23/2015    History of Present Illness Admitted for RTKA;  has a past medical history of BPH (benign prostatic hyperplasia); Hypertension; Essential thrombocytosis ([place]); History of stress test (2008); Peripheral vascular disease ([place]); DVT of leg (deep venous thrombosis) ([place]) (2001); Sleep apnea; Spleen enlarged; Headache; Thrombocythemia ([place]); and Arthritis.  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    PT Comments    Overall managing quite well; Stair training complete; Good progress to step-through pattern with cues for quad activation for R stance stability; OK for dc home from PT standpoint   Follow Up Recommendations  Home health PT;Supervision - Intermittent     Equipment Recommendations  Rolling walker with 5" wheels;3in1 (PT)    Recommendations for Other Services       Precautions / Restrictions Precautions Precautions: Knee Precaution Booklet Issued: Yes (comment) Precaution Comments: Reviewed not placing pillow, ice pack or other objects under R knee Restrictions RLE Weight Bearing: Weight bearing as tolerated    Mobility  Bed Mobility Overal bed mobility: Needs Assistance Bed Mobility: Supine to Sit     Supine to sit: Supervision     General bed mobility comments: Cues for technique  Transfers Overall transfer level: Needs assistance Equipment used: Rolling walker (2 wheeled) Transfers: Sit to/from Stand Sit to Stand: Supervision         General transfer comment: Min guard for safety and balance. Verbal cues for safe hand placement  Ambulation/Gait Ambulation/Gait assistance: Supervision Ambulation Distance (Feet): 100 Feet (x2) Assistive device: Rolling walker (2 wheeled) Gait  Pattern/deviations: Step-through pattern     General Gait Details: Cues for gait sequence and technqiue; nice, stable knee in stance, but with one episode of buckling that pt was able to catch himself on RW without physical assist   Stairs Stairs: Yes Stairs assistance: Min guard Stair Management: No rails;With walker;Forwards;Backwards;Step to pattern Number of Stairs: 1 (x2) General stair comments: Cues for sequence, RW management  Wheelchair Mobility    Modified Rankin (Stroke Patients Only)       Balance     Sitting balance-Leahy Scale: Good       Standing balance-Leahy Scale: Fair                      Cognition Arousal/Alertness: Awake/alert Behavior During Therapy: WFL for tasks assessed/performed Overall Cognitive Status: Within Functional Limits for tasks assessed                      Exercises Total Joint Exercises Quad Sets:  (Will plan for therex next session if he has not dc'd)    General Comments        Pertinent Vitals/Pain Pain Assessment: 0-10 Pain Score: 6  Pain Location: R knee Pain Descriptors / Indicators: Aching Pain Intervention(s): Limited activity within patient's tolerance;Monitored during session    Home Living                      Prior Function            PT Goals (current goals can now be found in the care plan section) Acute Rehab PT Goals Patient Stated Goal: to get back to doing all the things I love to do PT Goal Formulation: With patient Time For Goal Achievement:  05/29/15 Potential to Achieve Goals: Good Progress towards PT goals: Progressing toward goals    Frequency  7X/week    PT Plan Current plan remains appropriate    Co-evaluation             End of Session Equipment Utilized During Treatment: Gait belt Activity Tolerance: Patient tolerated treatment well Patient left: in chair;with call bell/phone within reach;with family/visitor present     Time: [id number] PT Time  Calculation (min) (ACUTE ONLY): 42 min  Charges:  $Gait Training: 23-37 mins $Therapeutic Activity: 8-22 mins                    G Codes:      Quin Hoop 05/23/2015, 9:41 AM  Roney Marion, [place] Pager [phone] Office [phone]

## 2015-05-23 NOTE — Progress Notes (Signed)
Dennis Martin discharged home per MD order. Discharge instructions reviewed and discussed with patient. All questions and concerns answered. Copy of instructions and scripts given to patient. IV removed.  Patient escorted to car by staff in a wheelchair. No distress noted upon discharge.   Esaw Dace 05/23/2015 3:59 PM

## 2015-05-23 NOTE — Discharge Summary (Signed)
Patient ID: Dennis Martin MRN: [id number] DOB/AGE: 1955/10/07 60 y.o.  Admit date: 05/22/2015 Discharge date: 05/23/2015  Admission Diagnoses:  Principal Problem:   Primary osteoarthritis of right knee   Discharge Diagnoses:  Same  Past Medical History  Diagnosis Date  . BPH (benign prostatic hyperplasia)   . Hypertension   . Essential thrombocytosis ([place])   . History of stress test 2008    done in [place] ,nuclear stress test- told that it was normal  . Peripheral vascular disease ([place])   . DVT of leg (deep venous thrombosis) ([place]) 2001    post knee arthroscopy,also complicated by infection, PICC line used for long term antibiotics, was on coumadin x's 6 months  . Sleep apnea     did surgery for the problem  . Spleen enlarged   . Headache     relative to testosterone level, it had been a problem, improved with clomid  . Thrombocythemia ([place])     monitored by Dr. Earlie Server  . Arthritis     both arthritis, wrist, hands     Surgeries: Procedure(s): TOTAL KNEE ARTHROPLASTY on 05/22/2015   Consultants:    Discharged Condition: Improved  Hospital Course: Dennis Martin is an 60 y.o. male who was admitted 05/22/2015 for operative treatment ofPrimary osteoarthritis of right knee. Patient has severe unremitting pain that affects sleep, daily activities, and work/hobbies. After pre-op clearance the patient was taken to the operating room on 05/22/2015 and underwent  Procedure(s): TOTAL KNEE ARTHROPLASTY.    Patient was given perioperative antibiotics: Anti-infectives    Start     Dose/Rate Route Frequency Ordered Stop   05/22/15 0824  tobramycin (NEBCIN) powder  Status:  Discontinued       As needed 05/22/15 0824 05/22/15 0928   05/22/15 0600  vancomycin (VANCOCIN) 1,500 mg in sodium chloride 0.9 % 500 mL IVPB     1,500 mg 250 mL/hr over 120 Minutes Intravenous On call to O.R. 05/21/15 1454 05/22/15 0706       Patient was given sequential compression devices, early  ambulation, and chemoprophylaxis to prevent DVT.  Patient benefited maximally from hospital stay and there were no complications.    Recent vital signs: Patient Vitals for the past 24 hrs:  BP Temp Temp src Pulse Resp SpO2  05/23/15 1252 (!) 148/76 mmHg 98.1 F (36.7 C) Oral 71 16 99 %  05/23/15 0448 132/63 mmHg 97.7 F (36.5 C) Oral 69 15 97 %  05/23/15 0109 113/68 mmHg 97.5 F (36.4 C) Oral 71 14 98 %  05/22/15 2118 107/66 mmHg 97.5 F (36.4 C) Oral 72 15 98 %     Recent laboratory studies:  Recent Labs  05/23/15 0850  WBC 14.6*  HGB 13.0  HCT 38.1*  PLT 395  NA 130*  K 4.4  CL 97*  CO2 27  BUN 12  CREATININE 0.84  GLUCOSE 144*  CALCIUM 8.2*     Discharge Medications:     Medication List    TAKE these medications        aspirin 81 MG tablet  Take 81 mg by mouth daily.     aspirin EC 325 MG tablet  Take 1 tablet (325 mg total) by mouth 2 (two) times daily.     calcium carbonate 750 MG chewable tablet  Commonly known as:  TUMS EX  Chew 1 tablet by mouth daily as needed for heartburn.     cetaphil cream  Apply topically as needed.     clomiPHENE 50  MG tablet  Commonly known as:  CLOMID  Take 50 mg by mouth daily.     desonide 0.05 % cream  Commonly known as:  DESOWEN  Apply 1 application topically 2 (two) times daily as needed (rash).     folic acid [id number] MCG tablet  Commonly known as:  FOLVITE  Take 400 mcg by mouth daily.     hydrocortisone 2.5 % lotion  Apply 1 application topically daily as needed (rash/itching).     hydroxyurea 500 MG capsule  Commonly known as:  HYDREA  Take 500-1,000 mg by mouth daily. Take 1 capsule (500 mg) daily except take 2 capsules (1000 mg) on Fridays May take with food to minimize GI side effects.     ketoconazole 2 % cream  Commonly known as:  NIZORAL  Apply 1 application topically daily.     ketoconazole 2 % cream  Commonly known as:  NIZORAL  Apply 1 application topically daily as needed for irritation.      lisinopril 10 MG tablet  Commonly known as:  PRINIVIL,ZESTRIL  Take 10 mg by mouth daily.     methocarbamol 500 MG tablet  Commonly known as:  ROBAXIN  Take 1 tablet (500 mg total) by mouth 2 (two) times daily with a meal.     OVER THE COUNTER MEDICATION  Vitafusion men's complete vitamin gummies; pt chews two gummies daily.     oxyCODONE-acetaminophen 5-325 MG tablet  Commonly known as:  ROXICET  Take 1 tablet by mouth every 4 (four) hours as needed.     sildenafil 100 MG tablet  Commonly known as:  VIAGRA  Take 0.5-1 tablets (50-100 mg total) by mouth daily as needed for erectile dysfunction.     triamcinolone cream 0.1 %  Commonly known as:  KENALOG  Apply 1 application topically 2 (two) times daily as needed (skin irritation).        Diagnostic Studies: Dg Chest 2 View  05/12/2015  CLINICAL DATA:  Preop for total knee arthroplasty. History of hypertension. EXAM: CHEST  2 VIEW COMPARISON:  None. FINDINGS: The heart size and mediastinal contours are within normal limits. Both lungs are clear. No pleural effusion or pneumothorax. The visualized skeletal structures are unremarkable. IMPRESSION: No active cardiopulmonary disease. Electronically Signed   By: Lajean Manes M.D.   On: 05/12/2015 13:18    Disposition: 01-Home or Self Care      Discharge Instructions    CPM    Complete by:  As directed   Continuous passive motion machine (CPM):      Use the CPM from 0 to 60  for 5 hours per day.      You may increase by 10 degrees per day.  You may break it up into 2 or 3 sessions per day.      Use CPM for 2 weeks or until you are told to stop.     Call MD / Call 911    Complete by:  As directed   If you experience chest pain or shortness of breath, CALL 911 and be transported to the hospital emergency room.  If you develope a fever above 101 F, pus (white drainage) or increased drainage or redness at the wound, or calf pain, call your surgeon's office.     Change dressing     Complete by:  As directed   Change dressing on day 5, then change the dressing daily with sterile 4 x 4 inch gauze dressing and apply TED hose.  You may clean the incision with alcohol prior to redressing.     Constipation Prevention    Complete by:  As directed   Drink plenty of fluids.  Prune juice may be helpful.  You may use a stool softener, such as Colace (over the counter) 100 mg twice a day.  Use MiraLax (over the counter) for constipation as needed.     Diet - low sodium heart healthy    Complete by:  As directed      Driving restrictions    Complete by:  As directed   No driving for 2 weeks     Increase activity slowly as tolerated    Complete by:  As directed      Patient may shower    Complete by:  As directed   You may shower without a dressing once there is no drainage.  Do not wash over the wound.  If drainage remains, cover wound with plastic wrap and then shower.           Follow-up Information    Follow up with Kerin Salen, MD In 2 weeks.   Specialty:  Orthopedic Surgery   Contact information:   [place] [address] [phone]       Follow up with [facility].   Why:  Someone from [facility] will contact you concerning start date and time for therapy.   Contact information:   [address] [place] [address] [phone]        Signed: Hardin Negus, Rourke Mcquitty R 05/23/2015, 2:08 PM

## 2015-06-07 ENCOUNTER — Telehealth: Payer: Self-pay | Admitting: Family Medicine

## 2015-06-07 DIAGNOSIS — R0683 Snoring: Secondary | ICD-10-CM

## 2015-06-07 NOTE — Telephone Encounter (Signed)
Referral placed.

## 2015-06-07 NOTE — Telephone Encounter (Signed)
Pt called and would like to get a referral to Pulmonary for a sleep study.  His wife is a patient of Dr. Gwenette Greet.  Best number to call is [phone]

## 2015-06-27 ENCOUNTER — Other Ambulatory Visit: Payer: Self-pay | Admitting: Orthopedic Surgery

## 2015-06-27 DIAGNOSIS — Z96651 Presence of right artificial knee joint: Secondary | ICD-10-CM | POA: Diagnosis not present

## 2015-06-27 DIAGNOSIS — M25561 Pain in right knee: Secondary | ICD-10-CM | POA: Diagnosis not present

## 2015-06-28 DIAGNOSIS — D6851 Activated protein C resistance: Secondary | ICD-10-CM | POA: Diagnosis not present

## 2015-06-28 DIAGNOSIS — N138 Other obstructive and reflux uropathy: Secondary | ICD-10-CM | POA: Diagnosis not present

## 2015-06-28 DIAGNOSIS — E291 Testicular hypofunction: Secondary | ICD-10-CM | POA: Diagnosis not present

## 2015-06-28 DIAGNOSIS — N401 Enlarged prostate with lower urinary tract symptoms: Secondary | ICD-10-CM | POA: Diagnosis not present

## 2015-06-28 DIAGNOSIS — Z0000 Encounter for general adult medical examination without abnormal findings: Secondary | ICD-10-CM | POA: Diagnosis not present

## 2015-06-29 DIAGNOSIS — M25561 Pain in right knee: Secondary | ICD-10-CM | POA: Diagnosis not present

## 2015-06-29 DIAGNOSIS — Z96651 Presence of right artificial knee joint: Secondary | ICD-10-CM | POA: Diagnosis not present

## 2015-07-04 DIAGNOSIS — Z96651 Presence of right artificial knee joint: Secondary | ICD-10-CM | POA: Diagnosis not present

## 2015-07-04 DIAGNOSIS — M25561 Pain in right knee: Secondary | ICD-10-CM | POA: Diagnosis not present

## 2015-07-05 ENCOUNTER — Other Ambulatory Visit (HOSPITAL_COMMUNITY): Payer: Self-pay | Admitting: *Deleted

## 2015-07-05 NOTE — Pre-Procedure Instructions (Signed)
Dennis Martin  07/05/2015      Your procedure is scheduled on Monday, July 17, 2015 at 1:15 PM.   Report to [facility] Entrance "A" Admitting Office at 11:15 AM.   Call this number if you have problems the morning of surgery: [phone]   Any questions prior to day of surgery, please call [phone] between 8 & 4 PM.   Remember:  Do not eat food or drink liquids after midnight Sunday, 07/16/15.  Take these medicines the morning of surgery with A SIP OF WATER: Clomid, Tamsulosin (Flomax), Methocarbamol (Robaxin), Oxycodone - if needed   Do not wear jewelry.  Do not wear lotions, powders, or cologne.  You may wear deodorant.  Men may shave face and neck.  Do not bring valuables to the hospital.  [facility] is not responsible for any belongings or valuables.  Contacts, dentures or bridgework may not be worn into surgery.  Leave your suitcase in the car.  After surgery it may be brought to your room.  For patients admitted to the hospital, discharge time will be determined by your treatment team.  Special instructions:  [facility] - Preparing for Surgery  Before surgery, you can play an important role.  Because skin is not sterile, your skin needs to be as free of germs as possible.  You can reduce the number of germs on you skin by washing with CHG (chlorahexidine gluconate) soap before surgery.  CHG is an antiseptic cleaner which kills germs and bonds with the skin to continue killing germs even after washing.  Please DO NOT use if you have an allergy to CHG or antibacterial soaps.  If your skin becomes reddened/irritated stop using the CHG and inform your nurse when you arrive at Short Stay.  Do not shave (including legs and underarms) for at least 48 hours prior to the first CHG shower.  You may shave your face.  Please follow these instructions carefully:   1.  Shower with CHG Soap the night before surgery and the                                morning of  Surgery.  2.  If you choose to wash your hair, wash your hair first as usual with your       normal shampoo.  3.  After you shampoo, rinse your hair and body thoroughly to remove the                      Shampoo.  4.  Use CHG as you would any other liquid soap.  You can apply chg directly       to the skin and wash gently with scrungie or a clean washcloth.  5.  Apply the CHG Soap to your body ONLY FROM THE NECK DOWN.        Do not use on open wounds or open sores.  Avoid contact with your eyes, ears, mouth and genitals (private parts).  Wash genitals (private parts) with your normal soap.  6.  Wash thoroughly, paying special attention to the area where your surgery        will be performed.  7.  Thoroughly rinse your body with warm water from the neck down.  8.  DO NOT shower/wash with your normal soap after using and rinsing off       the CHG Soap.  9.  Pat yourself dry with a clean towel.            10.  Wear clean pajamas.            11.  Place clean sheets on your bed the night of your first shower and do not        sleep with pets.  Day of Surgery  Do not apply any lotions the morning of surgery.  Please wear clean clothes to the hospital.   Please read over the following fact sheets that you were given. Pain Booklet, Coughing and Deep Breathing, Blood Transfusion Information, MRSA Information and Surgical Site Infection Prevention

## 2015-07-06 ENCOUNTER — Encounter (HOSPITAL_COMMUNITY)
Admission: RE | Admit: 2015-07-06 | Discharge: 2015-07-06 | Disposition: A | Discharge status: Home / Self Care | Payer: BLUE CROSS/BLUE SHIELD | Source: Ambulatory Visit | Attending: Orthopedic Surgery | Admitting: Orthopedic Surgery

## 2015-07-06 ENCOUNTER — Encounter (HOSPITAL_COMMUNITY): Payer: Self-pay

## 2015-07-06 DIAGNOSIS — M1712 Unilateral primary osteoarthritis, left knee: Secondary | ICD-10-CM | POA: Insufficient documentation

## 2015-07-06 DIAGNOSIS — Z01812 Encounter for preprocedural laboratory examination: Secondary | ICD-10-CM | POA: Diagnosis not present

## 2015-07-06 DIAGNOSIS — Z0183 Encounter for blood typing: Secondary | ICD-10-CM | POA: Diagnosis not present

## 2015-07-06 HISTORY — DX: Anxiety disorder, unspecified: F41.9

## 2015-07-06 LAB — CBC WITH DIFFERENTIAL/PLATELET
Basophils Absolute: 0 10*3/uL (ref 0.0–0.1)
Basophils Relative: 1 %
EOS ABS: 0.1 10*3/uL (ref 0.0–0.7)
Eosinophils Relative: 2 %
HEMATOCRIT: 45 % (ref 39.0–52.0)
HEMOGLOBIN: 14.4 g/dL (ref 13.0–17.0)
LYMPHS ABS: 1 10*3/uL (ref 0.7–4.0)
LYMPHS PCT: 14 %
MCH: 33.3 pg (ref 26.0–34.0)
MCHC: 32 g/dL (ref 30.0–36.0)
MCV: 104.2 fL — AB (ref 78.0–100.0)
Monocytes Absolute: 0.8 10*3/uL (ref 0.1–1.0)
Monocytes Relative: 11 %
NEUTROS ABS: 5.4 10*3/uL (ref 1.7–7.7)
NEUTROS PCT: 72 %
Platelets: 438 10*3/uL — ABNORMAL HIGH (ref 150–400)
RBC: 4.32 MIL/uL (ref 4.22–5.81)
RDW: 13.1 % (ref 11.5–15.5)
WBC: 7.3 10*3/uL (ref 4.0–10.5)

## 2015-07-06 LAB — BASIC METABOLIC PANEL
ANION GAP: 11 (ref 5–15)
BUN: 15 mg/dL (ref 6–20)
CHLORIDE: 104 mmol/L (ref 101–111)
CO2: 25 mmol/L (ref 22–32)
Calcium: 8.9 mg/dL (ref 8.9–10.3)
Creatinine, Ser: 1.02 mg/dL (ref 0.61–1.24)
GFR calc non Af Amer: >60 mL/min (ref >60)
Glucose, Bld: 85 mg/dL (ref 65–99)
POTASSIUM: 4.5 mmol/L (ref 3.5–5.1)
SODIUM: 140 mmol/L (ref 135–145)

## 2015-07-06 LAB — URINALYSIS, ROUTINE W REFLEX MICROSCOPIC
BILIRUBIN URINE: NEGATIVE
Glucose, UA: NEGATIVE mg/dL
Hgb urine dipstick: NEGATIVE
KETONES UR: NEGATIVE mg/dL
Leukocytes, UA: NEGATIVE
NITRITE: NEGATIVE
PH: 5 (ref 5.0–8.0)
Protein, ur: NEGATIVE mg/dL
Specific Gravity, Urine: 1.024 (ref 1.005–1.030)

## 2015-07-06 LAB — SURGICAL PCR SCREEN
MRSA, PCR: NEGATIVE
STAPHYLOCOCCUS AUREUS: NEGATIVE

## 2015-07-06 LAB — TYPE AND SCREEN
ABO/RH(D): O POS
ANTIBODY SCREEN: NEGATIVE

## 2015-07-06 LAB — APTT: aPTT: 33 seconds (ref 24–37)

## 2015-07-06 LAB — PROTIME-INR
INR: 1.18 (ref 0.00–1.49)
PROTHROMBIN TIME: 15.2 s (ref 11.6–15.2)

## 2015-07-06 NOTE — Progress Notes (Signed)
Pt denies cardiac history, chest pain or sob. 

## 2015-07-06 NOTE — Progress Notes (Signed)
   07/06/15 1316  OBSTRUCTIVE SLEEP APNEA  Have you ever been diagnosed with sleep apnea through a sleep study? No  Do you snore loudly (loud enough to be heard through closed doors)?  1  Do you often feel tired, fatigued, or sleepy during the daytime (such as falling asleep during driving or talking to someone)? 0  Has anyone observed you stop breathing during your sleep? 1  Do you have, or are you being treated for high blood pressure? 1  BMI more than 35 kg/m2? 0  Age > 50 (1-yes) 1  Neck circumference greater than:Male 16 inches or larger, Male 17inches or larger? 0  Male Gender (Yes=1) 1  Obstructive Sleep Apnea Score 5  Score 5 or greater  Results sent to PCP

## 2015-07-13 DIAGNOSIS — Z96651 Presence of right artificial knee joint: Secondary | ICD-10-CM | POA: Diagnosis not present

## 2015-07-13 DIAGNOSIS — Z471 Aftercare following joint replacement surgery: Secondary | ICD-10-CM | POA: Diagnosis not present

## 2015-07-14 NOTE — H&P (Signed)
TOTAL KNEE ADMISSION H&P  Patient is being admitted for left total knee arthroplasty.  Subjective:  Chief Complaint:left knee pain.  HPI: Dennis Martin, 60 y.o. male, has a history of pain and functional disability in the left knee due to arthritis and has failed non-surgical conservative treatments for greater than 12 weeks to includeNSAID's and/or analgesics, flexibility and strengthening excercises, weight reduction as appropriate and activity modification.  Onset of symptoms was gradual, starting 10 years ago with gradually worsening course since that time. The patient noted no past surgery on the left knee(s).  Patient currently rates pain in the left knee(s) at 10 out of 10 with activity. Patient has night pain, worsening of pain with activity and weight bearing, pain that interferes with activities of daily living and pain with passive range of motion.  Patient has evidence of subchondral sclerosis, joint subluxation and joint space narrowing by imaging studies. There is no active infection.  Patient Active Problem List   Diagnosis Date Noted  . Primary osteoarthritis of right knee 05/21/2015  . Preoperative evaluation to rule out surgical contraindication 05/18/2015  . Low testosterone 05/08/2015  . Erectile dysfunction 05/08/2015  . Splenomegaly 03/29/2015  . Hyperkalemia 05/03/2014  . Routine general medical examination at a health care facility 04/14/2012  . Essential thrombocytosis ([place]) 08/12/2011  . HTN (hypertension) 08/12/2011  . BPH (benign prostatic hyperplasia) 08/12/2011   Past Medical History  Diagnosis Date  . BPH (benign prostatic hyperplasia)   . Hypertension   . Essential thrombocytosis ([place])   . History of stress test 2008    done in [place] ,nuclear stress test- told that it was normal  . Peripheral vascular disease ([place])   . DVT of leg (deep venous thrombosis) ([place]) 2001    post knee arthroscopy,also complicated by infection, PICC line used for long  term antibiotics, was on coumadin x's 6 months  . Sleep apnea     did surgery for the problem  . Spleen enlarged   . Headache     relative to testosterone level, it had been a problem, improved with clomid  . Thrombocythemia ([place])     monitored by Dr. Earlie Server  . Arthritis     both arthritis, wrist, hands   . Anxiety     hx of - 10 years ago    Past Surgical History  Procedure Laterality Date  . Ulnar nerve transposition  2007    left  . Knee surgery Bilateral 2001  . Tonsillectomy    . Eye surgery      chalazion on both eyelids  . Nasal sinus surgery    . Total knee arthroplasty Right 05/22/2015  . Total knee arthroplasty Right 05/22/2015    Procedure: TOTAL KNEE ARTHROPLASTY;  Surgeon: Frederik Pear, MD;  Location: [facility];  Service: Orthopedics;  Laterality: Right;    No prescriptions prior to admission   Allergies  Allergen Reactions  . Keflex [Cephalexin]     rash    Social History  Substance Use Topics  . Smoking status: Former Smoker    Quit date: 05/06/2003  . Smokeless tobacco: Never Used     Comment: Quit 2005  . Alcohol Use: 8.4 oz/week    14 Cans of beer per week     Comment: 2 beers daily    Family History  Problem Relation Age of Onset  . Stomach cancer Neg Hx   . Colon cancer Paternal Aunt 75     Review of Systems  Constitutional: Positive  for malaise/fatigue and diaphoresis.  Eyes: Negative.   Respiratory: Negative.   Cardiovascular:       HTN and history of blood clots  Gastrointestinal: Positive for heartburn, abdominal pain and blood in stool.  Genitourinary: Positive for urgency.       Weak stream and enlarged prostate, ED  Musculoskeletal: Positive for joint pain.  Skin: Positive for rash.  Neurological: Positive for headaches.  Endo/Heme/Allergies: Negative.   Psychiatric/Behavioral: The patient has insomnia.     Objective:  Physical Exam  Constitutional: He is oriented to person, place, and time. He appears well-developed and  well-nourished.  HENT:  Head: Normocephalic and atraumatic.  Eyes: Pupils are equal, round, and reactive to light.  Neck: Normal range of motion. Neck supple.  Cardiovascular: Intact distal pulses.   Respiratory: Effort normal.  Musculoskeletal: He exhibits tenderness.  Tender along the medial joint line of the left knee, range of motion 5/125 , no effusion, collateral ligaments are stable.  Quadriceps and hamstring power is intact.  Normal pulses to his feet.    Neurological: He is alert and oriented to person, place, and time.  Skin: Skin is warm and dry.  Psychiatric: He has a normal mood and affect. His behavior is normal. Judgment and thought content normal.    Vital signs in last 24 hours:    Labs:   Estimated body mass index is 25.98 kg/(m^2) as calculated from the following:   Height as of 05/22/15: 5\' 11"  (1.803 m).   Weight as of 05/18/15: 84.46 kg (186 lb 3.2 oz).   Imaging Review Plain radiographs demonstrate  AP, Rosenberg, lateral and sunrise x-rays of both knees show bone-on-bone arthritis of the right knee with lateral subluxation of the tibia beneath the femur.  On the left side the arthritis is moderate with a couple millimeters of remaining cartilage medially.  Assessment/Plan:  End stage arthritis, left knee   The patient history, physical examination, clinical judgment of the provider and imaging studies are consistent with end stage degenerative joint disease of the left knee(s) and total knee arthroplasty is deemed medically necessary. The treatment options including medical management, injection therapy arthroscopy and arthroplasty were discussed at length. The risks and benefits of total knee arthroplasty were presented and reviewed. The risks due to aseptic loosening, infection, stiffness, patella tracking problems, thromboembolic complications and other imponderables were discussed. The patient acknowledged the explanation, agreed to proceed with the plan and  consent was signed. Patient is being admitted for inpatient treatment for surgery, pain control, PT, OT, prophylactic antibiotics, VTE prophylaxis, progressive ambulation and ADL's and discharge planning. The patient is planning to be discharged home with home health services

## 2015-07-14 NOTE — Progress Notes (Signed)
Notified patient of time change and instructed him to arrive at 915 am 07/17/15.

## 2015-07-16 DIAGNOSIS — M1712 Unilateral primary osteoarthritis, left knee: Secondary | ICD-10-CM | POA: Diagnosis present

## 2015-07-17 ENCOUNTER — Encounter (HOSPITAL_COMMUNITY): Payer: Self-pay | Admitting: Certified Registered"

## 2015-07-17 ENCOUNTER — Inpatient Hospital Stay (HOSPITAL_COMMUNITY): Payer: BLUE CROSS/BLUE SHIELD | Admitting: Certified Registered"

## 2015-07-17 ENCOUNTER — Encounter (HOSPITAL_COMMUNITY)
Admission: RE | Disposition: A | Discharge status: Home Health | Payer: Self-pay | Source: Ambulatory Visit | Attending: Orthopedic Surgery

## 2015-07-17 ENCOUNTER — Inpatient Hospital Stay (HOSPITAL_COMMUNITY)
Admission: RE | Admit: 2015-07-17 | Discharge: 2015-07-18 | DRG: 470 | Disposition: A | Discharge status: Home Health | Payer: BLUE CROSS/BLUE SHIELD | Source: Ambulatory Visit | Attending: Orthopedic Surgery | Admitting: Orthopedic Surgery

## 2015-07-17 DIAGNOSIS — N400 Enlarged prostate without lower urinary tract symptoms: Secondary | ICD-10-CM | POA: Diagnosis present

## 2015-07-17 DIAGNOSIS — D7589 Other specified diseases of blood and blood-forming organs: Secondary | ICD-10-CM | POA: Diagnosis not present

## 2015-07-17 DIAGNOSIS — I10 Essential (primary) hypertension: Secondary | ICD-10-CM | POA: Diagnosis not present

## 2015-07-17 DIAGNOSIS — Z87891 Personal history of nicotine dependence: Secondary | ICD-10-CM | POA: Diagnosis not present

## 2015-07-17 DIAGNOSIS — I739 Peripheral vascular disease, unspecified: Secondary | ICD-10-CM | POA: Diagnosis present

## 2015-07-17 DIAGNOSIS — G4730 Sleep apnea, unspecified: Secondary | ICD-10-CM | POA: Diagnosis present

## 2015-07-17 DIAGNOSIS — Z86718 Personal history of other venous thrombosis and embolism: Secondary | ICD-10-CM

## 2015-07-17 DIAGNOSIS — M1712 Unilateral primary osteoarthritis, left knee: Principal | ICD-10-CM | POA: Diagnosis present

## 2015-07-17 DIAGNOSIS — F419 Anxiety disorder, unspecified: Secondary | ICD-10-CM | POA: Diagnosis not present

## 2015-07-17 DIAGNOSIS — Z96652 Presence of left artificial knee joint: Secondary | ICD-10-CM | POA: Diagnosis not present

## 2015-07-17 DIAGNOSIS — M179 Osteoarthritis of knee, unspecified: Secondary | ICD-10-CM | POA: Diagnosis not present

## 2015-07-17 DIAGNOSIS — M25562 Pain in left knee: Secondary | ICD-10-CM | POA: Diagnosis not present

## 2015-07-17 DIAGNOSIS — D62 Acute posthemorrhagic anemia: Secondary | ICD-10-CM | POA: Diagnosis not present

## 2015-07-17 DIAGNOSIS — M1710 Unilateral primary osteoarthritis, unspecified knee: Secondary | ICD-10-CM | POA: Diagnosis present

## 2015-07-17 HISTORY — PX: TOTAL KNEE ARTHROPLASTY: SHX125

## 2015-07-17 SURGERY — ARTHROPLASTY, KNEE, TOTAL
Anesthesia: Spinal | Site: Knee | Laterality: Left

## 2015-07-17 MED ORDER — TRANEXAMIC ACID 1000 MG/10ML IV SOLN
2000.0000 mg | INTRAVENOUS | Status: DC
  Filled 2015-07-17: qty 20

## 2015-07-17 MED ORDER — HYDROXYUREA 500 MG PO CAPS: 1000.0000 mg | ORAL_CAPSULE | ORAL | Status: DC

## 2015-07-17 MED ORDER — PROPOFOL 500 MG/50ML IV EMUL
INTRAVENOUS | Status: DC | PRN
  Administered 2015-07-17: 75 ug/kg/min via INTRAVENOUS

## 2015-07-17 MED ORDER — HYDROMORPHONE HCL 1 MG/ML IJ SOLN: 0.2500 mg | INTRAMUSCULAR | Status: DC | PRN

## 2015-07-17 MED ORDER — MENTHOL 3 MG MT LOZG: 1.0000 | LOZENGE | OROMUCOSAL | Status: DC | PRN

## 2015-07-17 MED ORDER — PHENYLEPHRINE HCL 10 MG/ML IJ SOLN
10.0000 mg | INTRAMUSCULAR | Status: DC | PRN
Start: 2015-07-17 — End: 2015-07-17
  Administered 2015-07-17: 20 ug/min via INTRAVENOUS

## 2015-07-17 MED ORDER — METHOCARBAMOL 1000 MG/10ML IJ SOLN
500.0000 mg | Freq: Four times a day (QID) | INTRAVENOUS | Status: DC | PRN
  Filled 2015-07-17: qty 5

## 2015-07-17 MED ORDER — SODIUM CHLORIDE 0.9 % IJ SOLN
INTRAMUSCULAR | Status: DC | PRN
  Administered 2015-07-17 (×2): 10 mL

## 2015-07-17 MED ORDER — HYDROMORPHONE HCL 1 MG/ML IJ SOLN: 0.5000 mg | INTRAMUSCULAR | Status: DC | PRN

## 2015-07-17 MED ORDER — HYDROXYUREA 500 MG PO CAPS
500.0000 mg | ORAL_CAPSULE | Freq: Every day | ORAL | Status: DC
Start: 2015-07-17 — End: 2015-07-17

## 2015-07-17 MED ORDER — METOCLOPRAMIDE HCL 5 MG/ML IJ SOLN: 5.0000 mg | Freq: Three times a day (TID) | INTRAMUSCULAR | Status: DC | PRN

## 2015-07-17 MED ORDER — FLEET ENEMA 7-19 GM/118ML RE ENEM: 1.0000 | ENEMA | Freq: Once | RECTAL | Status: DC | PRN

## 2015-07-17 MED ORDER — OXYCODONE HCL 5 MG PO TABS
5.0000 mg | ORAL_TABLET | ORAL | Status: DC | PRN
  Administered 2015-07-17 – 2015-07-18 (×6): 10 mg via ORAL
  Filled 2015-07-17 (×6): qty 2

## 2015-07-17 MED ORDER — DIPHENHYDRAMINE HCL 12.5 MG/5ML PO ELIX
12.5000 mg | ORAL_SOLUTION | ORAL | Status: DC | PRN
Start: 2015-07-17 — End: 2015-07-18

## 2015-07-17 MED ORDER — BUPIVACAINE LIPOSOME 1.3 % IJ SUSP
20.0000 mL | Freq: Once | INTRAMUSCULAR | Status: AC
  Administered 2015-07-17: 20 mL
  Filled 2015-07-17: qty 20

## 2015-07-17 MED ORDER — ALUM & MAG HYDROXIDE-SIMETH 200-200-20 MG/5ML PO SUSP: 30.0000 mL | ORAL | Status: DC | PRN

## 2015-07-17 MED ORDER — ACETAMINOPHEN 325 MG PO TABS: 650.0000 mg | ORAL_TABLET | Freq: Four times a day (QID) | ORAL | Status: DC | PRN

## 2015-07-17 MED ORDER — MIDAZOLAM HCL 5 MG/5ML IJ SOLN
INTRAMUSCULAR | Status: DC | PRN
  Administered 2015-07-17: 2 mg via INTRAVENOUS

## 2015-07-17 MED ORDER — KETOCONAZOLE 2 % EX CREA
1.0000 "application " | TOPICAL_CREAM | Freq: Every day | CUTANEOUS | Status: DC | PRN
  Filled 2015-07-17: qty 15

## 2015-07-17 MED ORDER — CHLORHEXIDINE GLUCONATE 4 % EX LIQD: 60.0000 mL | Freq: Once | CUTANEOUS | Status: DC

## 2015-07-17 MED ORDER — TRANEXAMIC ACID 1000 MG/10ML IV SOLN
2000.0000 mg | Freq: Once | INTRAVENOUS | Status: AC
  Administered 2015-07-17: 2000 mg via TOPICAL
  Filled 2015-07-17: qty 20

## 2015-07-17 MED ORDER — FENTANYL CITRATE (PF) 250 MCG/5ML IJ SOLN
INTRAMUSCULAR | Status: DC | PRN
  Administered 2015-07-17: 50 ug via INTRAVENOUS

## 2015-07-17 MED ORDER — SODIUM CHLORIDE 0.9 % IR SOLN
Status: DC | PRN
  Administered 2015-07-17: 3000 mL

## 2015-07-17 MED ORDER — MIDAZOLAM HCL 2 MG/2ML IJ SOLN
INTRAMUSCULAR | Status: AC
  Filled 2015-07-17: qty 2

## 2015-07-17 MED ORDER — METOCLOPRAMIDE HCL 5 MG PO TABS
5.0000 mg | ORAL_TABLET | Freq: Three times a day (TID) | ORAL | Status: DC | PRN
Start: 2015-07-17 — End: 2015-07-18

## 2015-07-17 MED ORDER — DOCUSATE SODIUM 100 MG PO CAPS
100.0000 mg | ORAL_CAPSULE | Freq: Two times a day (BID) | ORAL | Status: DC
  Administered 2015-07-17 – 2015-07-18 (×2): 100 mg via ORAL
  Filled 2015-07-17 (×2): qty 1

## 2015-07-17 MED ORDER — LISINOPRIL 10 MG PO TABS
10.0000 mg | ORAL_TABLET | Freq: Every day | ORAL | Status: DC
  Administered 2015-07-17 – 2015-07-18 (×2): 10 mg via ORAL
  Filled 2015-07-17 (×2): qty 1

## 2015-07-17 MED ORDER — LACTATED RINGERS IV SOLN
INTRAVENOUS | Status: DC
  Administered 2015-07-17: 09:00:00 via INTRAVENOUS

## 2015-07-17 MED ORDER — CETAPHIL EX CREA: TOPICAL_CREAM | CUTANEOUS | Status: DC | PRN

## 2015-07-17 MED ORDER — CEFUROXIME SODIUM 1.5 G IJ SOLR
INTRAMUSCULAR | Status: AC
  Filled 2015-07-17: qty 1.5

## 2015-07-17 MED ORDER — BUPIVACAINE IN DEXTROSE 0.75-8.25 % IT SOLN
INTRATHECAL | Status: DC | PRN
  Administered 2015-07-17: 2 mL via INTRATHECAL

## 2015-07-17 MED ORDER — HYDROXYUREA 500 MG PO CAPS
500.0000 mg | ORAL_CAPSULE | ORAL | Status: DC
  Administered 2015-07-17: 500 mg via ORAL
  Filled 2015-07-17 (×2): qty 1

## 2015-07-17 MED ORDER — METHOCARBAMOL 500 MG PO TABS: 500.0000 mg | ORAL_TABLET | Freq: Two times a day (BID) | ORAL | Status: DC

## 2015-07-17 MED ORDER — BUPIVACAINE HCL 0.5 % IJ SOLN
INTRAMUSCULAR | Status: DC | PRN
  Administered 2015-07-17 (×2): 10 mL

## 2015-07-17 MED ORDER — SENNOSIDES-DOCUSATE SODIUM 8.6-50 MG PO TABS: 1.0000 | ORAL_TABLET | Freq: Every evening | ORAL | Status: DC | PRN

## 2015-07-17 MED ORDER — CEFUROXIME SODIUM 1.5 G IJ SOLR
INTRAMUSCULAR | Status: DC | PRN
  Administered 2015-07-17: 1.5 g

## 2015-07-17 MED ORDER — PHENOL 1.4 % MT LIQD: 1.0000 | OROMUCOSAL | Status: DC | PRN

## 2015-07-17 MED ORDER — LACTATED RINGERS IV SOLN: INTRAVENOUS | Status: DC

## 2015-07-17 MED ORDER — FOLIC ACID 1 MG PO TABS
500.0000 ug | ORAL_TABLET | Freq: Every day | ORAL | Status: DC
  Administered 2015-07-17: 0.5 mg via ORAL
  Filled 2015-07-17 (×2): qty 1

## 2015-07-17 MED ORDER — ASPIRIN EC 325 MG PO TBEC: 325.0000 mg | DELAYED_RELEASE_TABLET | Freq: Two times a day (BID) | ORAL | Status: DC

## 2015-07-17 MED ORDER — HYDROCORTISONE 1 % EX CREA
TOPICAL_CREAM | Freq: Two times a day (BID) | CUTANEOUS | Status: DC
  Administered 2015-07-18: 09:00:00 via TOPICAL
  Filled 2015-07-17: qty 28

## 2015-07-17 MED ORDER — MEPERIDINE HCL 25 MG/ML IJ SOLN: 6.2500 mg | INTRAMUSCULAR | Status: DC | PRN

## 2015-07-17 MED ORDER — KCL IN DEXTROSE-NACL 20-5-0.45 MEQ/L-%-% IV SOLN
INTRAVENOUS | Status: DC
  Administered 2015-07-17 – 2015-07-18 (×2): via INTRAVENOUS
  Filled 2015-07-17 (×2): qty 1000

## 2015-07-17 MED ORDER — 0.9 % SODIUM CHLORIDE (POUR BTL) OPTIME
TOPICAL | Status: DC | PRN
  Administered 2015-07-17: 1000 mL

## 2015-07-17 MED ORDER — ONDANSETRON HCL 4 MG PO TABS: 4.0000 mg | ORAL_TABLET | Freq: Four times a day (QID) | ORAL | Status: DC | PRN

## 2015-07-17 MED ORDER — ONDANSETRON HCL 4 MG/2ML IJ SOLN: 4.0000 mg | Freq: Four times a day (QID) | INTRAMUSCULAR | Status: DC | PRN

## 2015-07-17 MED ORDER — BISACODYL 5 MG PO TBEC: 5.0000 mg | DELAYED_RELEASE_TABLET | Freq: Every day | ORAL | Status: DC | PRN

## 2015-07-17 MED ORDER — TAMSULOSIN HCL 0.4 MG PO CAPS
0.4000 mg | ORAL_CAPSULE | Freq: Every day | ORAL | Status: DC
  Administered 2015-07-17: 0.4 mg via ORAL
  Filled 2015-07-17: qty 1

## 2015-07-17 MED ORDER — BUPIVACAINE HCL (PF) 0.5 % IJ SOLN
INTRAMUSCULAR | Status: AC
  Filled 2015-07-17: qty 30

## 2015-07-17 MED ORDER — OXYCODONE-ACETAMINOPHEN 5-325 MG PO TABS: 1.0000 | ORAL_TABLET | ORAL | Status: DC | PRN

## 2015-07-17 MED ORDER — METHOCARBAMOL 500 MG PO TABS
500.0000 mg | ORAL_TABLET | Freq: Four times a day (QID) | ORAL | Status: DC | PRN
  Administered 2015-07-18 (×2): 500 mg via ORAL
  Filled 2015-07-17 (×4): qty 1

## 2015-07-17 MED ORDER — FENTANYL CITRATE (PF) 100 MCG/2ML IJ SOLN
INTRAMUSCULAR | Status: AC
  Filled 2015-07-17: qty 2

## 2015-07-17 MED ORDER — TRIAMCINOLONE ACETONIDE 0.1 % EX CREA
1.0000 "application " | TOPICAL_CREAM | Freq: Two times a day (BID) | CUTANEOUS | Status: DC | PRN
  Filled 2015-07-17: qty 15

## 2015-07-17 MED ORDER — ANASTROZOLE 1 MG PO TABS
1.0000 mg | ORAL_TABLET | Freq: Every day | ORAL | Status: DC
  Administered 2015-07-17: 1 mg via ORAL
  Filled 2015-07-17 (×2): qty 1

## 2015-07-17 MED ORDER — ACETAMINOPHEN 650 MG RE SUPP: 650.0000 mg | Freq: Four times a day (QID) | RECTAL | Status: DC | PRN

## 2015-07-17 MED ORDER — CLOMIPHENE CITRATE 50 MG PO TABS
50.0000 mg | ORAL_TABLET | Freq: Every day | ORAL | Status: DC
  Administered 2015-07-17: 50 mg via ORAL
  Filled 2015-07-17: qty 1

## 2015-07-17 MED ORDER — FENTANYL CITRATE (PF) 250 MCG/5ML IJ SOLN
INTRAMUSCULAR | Status: AC
  Filled 2015-07-17: qty 5

## 2015-07-17 MED ORDER — ASPIRIN EC 325 MG PO TBEC
325.0000 mg | DELAYED_RELEASE_TABLET | Freq: Every day | ORAL | Status: DC
  Administered 2015-07-18: 325 mg via ORAL
  Filled 2015-07-17: qty 1

## 2015-07-17 MED ORDER — DEXTROSE-NACL 5-0.45 % IV SOLN: INTRAVENOUS | Status: DC

## 2015-07-17 MED ORDER — VANCOMYCIN HCL IN DEXTROSE 1-5 GM/200ML-% IV SOLN
1000.0000 mg | INTRAVENOUS | Status: AC
  Administered 2015-07-17: 1000 mg via INTRAVENOUS
  Filled 2015-07-17: qty 200

## 2015-07-17 MED ORDER — BUPIVACAINE HCL (PF) 0.5 % IJ SOLN
INTRAMUSCULAR | Status: AC
  Filled 2015-07-17: qty 10

## 2015-07-17 MED ORDER — KETOCONAZOLE 2 % EX CREA: 1.0000 "application " | TOPICAL_CREAM | Freq: Every day | CUTANEOUS | Status: DC

## 2015-07-17 SURGICAL SUPPLY — 56 items
BAG DECANTER FOR FLEXI CONT (MISCELLANEOUS) ×3 IMPLANT
BANDAGE ESMARK 6X9 LF (GAUZE/BANDAGES/DRESSINGS) ×1 IMPLANT
BLADE SAG 18X100X1.27 (BLADE) ×3 IMPLANT
BLADE SAW SGTL 13X75X1.27 (BLADE) ×3 IMPLANT
BLADE SURG ROTATE 9660 (MISCELLANEOUS) IMPLANT
BNDG ELASTIC 6X10 VLCR STRL LF (GAUZE/BANDAGES/DRESSINGS) ×3 IMPLANT
BNDG ESMARK 6X9 LF (GAUZE/BANDAGES/DRESSINGS) ×3
BOWL SMART MIX CTS (DISPOSABLE) ×3 IMPLANT
CAPT KNEE TOTAL 3 ATTUNE ×3 IMPLANT
CEMENT HV SMART SET (Cement) ×6 IMPLANT
COVER SURGICAL LIGHT HANDLE (MISCELLANEOUS) ×3 IMPLANT
CUFF TOURNIQUET SINGLE 34IN LL (TOURNIQUET CUFF) ×3 IMPLANT
CUFF TOURNIQUET SINGLE 44IN (TOURNIQUET CUFF) IMPLANT
DRAPE EXTREMITY T 121X128X90 (DRAPE) ×3 IMPLANT
DRAPE U-SHAPE 47X51 STRL (DRAPES) ×3 IMPLANT
DRSG AQUACEL AG ADV 3.5X10 (GAUZE/BANDAGES/DRESSINGS) ×3 IMPLANT
DURAPREP 26ML APPLICATOR (WOUND CARE) ×6 IMPLANT
ELECT REM PT RETURN 9FT ADLT (ELECTROSURGICAL) ×3
ELECTRODE REM PT RTRN 9FT ADLT (ELECTROSURGICAL) ×1 IMPLANT
EVACUATOR 1/8 PVC DRAIN (DRAIN) IMPLANT
GLOVE BIO SURGEON STRL SZ7.5 (GLOVE) ×6 IMPLANT
GLOVE BIO SURGEON STRL SZ8.5 (GLOVE) ×3 IMPLANT
GLOVE BIOGEL PI IND STRL 8 (GLOVE) ×1 IMPLANT
GLOVE BIOGEL PI IND STRL 9 (GLOVE) ×1 IMPLANT
GLOVE BIOGEL PI INDICATOR 8 (GLOVE) ×2
GLOVE BIOGEL PI INDICATOR 9 (GLOVE) ×2
GLOVE SKINSENSE NS SZ7.0 (GLOVE) ×2
GLOVE SKINSENSE STRL SZ7.0 (GLOVE) ×1 IMPLANT
GLOVE SURG SS PI 6.5 STRL IVOR (GLOVE) ×3 IMPLANT
GOWN STRL REUS W/ TWL LRG LVL3 (GOWN DISPOSABLE) ×1 IMPLANT
GOWN STRL REUS W/ TWL XL LVL3 (GOWN DISPOSABLE) ×2 IMPLANT
GOWN STRL REUS W/TWL LRG LVL3 (GOWN DISPOSABLE) ×2
GOWN STRL REUS W/TWL XL LVL3 (GOWN DISPOSABLE) ×4
HANDPIECE INTERPULSE COAX TIP (DISPOSABLE) ×2
HOOD PEEL AWAY FACE SHEILD DIS (HOOD) ×6 IMPLANT
KIT BASIN OR (CUSTOM PROCEDURE TRAY) ×3 IMPLANT
KIT ROOM TURNOVER OR (KITS) ×3 IMPLANT
MANIFOLD NEPTUNE II (INSTRUMENTS) ×3 IMPLANT
NEEDLE SPNL 18GX3.5 QUINCKE PK (NEEDLE) ×3 IMPLANT
NS IRRIG 1000ML POUR BTL (IV SOLUTION) ×3 IMPLANT
PACK TOTAL JOINT (CUSTOM PROCEDURE TRAY) ×3 IMPLANT
PAD ARMBOARD 7.5X6 YLW CONV (MISCELLANEOUS) ×6 IMPLANT
SET HNDPC FAN SPRY TIP SCT (DISPOSABLE) ×1 IMPLANT
SUT VIC AB 0 CT1 27 (SUTURE) ×2
SUT VIC AB 0 CT1 27XBRD ANBCTR (SUTURE) ×1 IMPLANT
SUT VIC AB 1 CTX 36 (SUTURE) ×2
SUT VIC AB 1 CTX36XBRD ANBCTR (SUTURE) ×1 IMPLANT
SUT VIC AB 2-0 CT1 27 (SUTURE)
SUT VIC AB 2-0 CT1 TAPERPNT 27 (SUTURE) IMPLANT
SUT VIC AB 3-0 CT1 27 (SUTURE) ×2
SUT VIC AB 3-0 CT1 TAPERPNT 27 (SUTURE) ×1 IMPLANT
SYR 50ML LL SCALE MARK (SYRINGE) ×3 IMPLANT
TOWEL OR 17X24 6PK STRL BLUE (TOWEL DISPOSABLE) ×3 IMPLANT
TOWEL OR 17X26 10 PK STRL BLUE (TOWEL DISPOSABLE) ×3 IMPLANT
TRAY CATH 16FR W/PLASTIC CATH (SET/KITS/TRAYS/PACK) IMPLANT
WATER STERILE IRR 1000ML POUR (IV SOLUTION) ×9 IMPLANT

## 2015-07-17 NOTE — Progress Notes (Signed)
Orthopedic Tech Progress Note Patient Details:  Dennis Martin 1955/11/25 [id number]  CPM Left Knee CPM Left Knee: On Left Knee Flexion (Degrees): 40 Left Knee Extension (Degrees): 0  Ortho Devices Ortho Device/Splint Location: applied ohf, footsie roll Ortho Device/Splint Interventions: Ordered, Application, Adjustment   [name] 07/17/2015, 4:15 PM

## 2015-07-17 NOTE — Interval H&P Note (Signed)
History and Physical Interval Note:  07/17/2015 9:25 AM  Dennis Martin  has presented today for surgery, with the diagnosis of LEFT KNEE OSTEOARTHRITIS  The various methods of treatment have been discussed with the patient and family. After consideration of risks, benefits and other options for treatment, the patient has consented to  Procedure(s): TOTAL KNEE ARTHROPLASTY (Left) as a surgical intervention .  The patient's history has been reviewed, patient examined, no change in status, stable for surgery.  I have reviewed the patient's chart and labs.  Questions were answered to the patient's satisfaction.     Kerin Salen

## 2015-07-17 NOTE — Transfer of Care (Signed)
Immediate Anesthesia Transfer of Care Note  Patient: Dennis Martin  Procedure(s) Performed: Procedure(s): TOTAL KNEE ARTHROPLASTY (Left)  Patient Location: PACU  Anesthesia Type:MAC and Spinal  Level of Consciousness: awake, alert , oriented and patient cooperative  Airway & Oxygen Therapy: Patient Spontanous Breathing and Patient connected to nasal cannula oxygen  Post-op Assessment: Report given to RN, Post -op Vital signs reviewed and stable and Patient moving all extremities  Post vital signs: Reviewed and stable  Last Vitals:  Filed Vitals:   07/17/15 0920  BP: 136/69  Pulse: 65  Temp: 36.6 C  Resp: 20    Complications: No apparent anesthesia complications

## 2015-07-17 NOTE — Discharge Instructions (Signed)

## 2015-07-17 NOTE — Anesthesia Preprocedure Evaluation (Addendum)
Anesthesia Evaluation  Patient identified by MRN, date of birth, ID band Patient awake    Reviewed: Allergy & Precautions, NPO status , Patient's Chart, lab work & pertinent test results  Airway Mallampati: I  TM Distance: >3 FB Neck ROM: Full    Dental  (+) Teeth Intact, Dental Advisory Given   Pulmonary sleep apnea , former smoker,    breath sounds clear to auscultation       Cardiovascular hypertension, Pt. on medications + Peripheral Vascular Disease   Rhythm:Regular Rate:Normal     Neuro/Psych  Headaches, PSYCHIATRIC DISORDERS Anxiety    GI/Hepatic negative GI ROS, Neg liver ROS,   Endo/Other  negative endocrine ROS  Renal/GU negative Renal ROS  negative genitourinary   Musculoskeletal  (+) Arthritis ,   Abdominal   Peds negative pediatric ROS (+)  Hematology negative hematology ROS (+)   Anesthesia Other Findings   Reproductive/Obstetrics negative OB ROS                           Anesthesia Physical Anesthesia Plan  ASA: III  Anesthesia Plan: Spinal   Post-op Pain Management:  Regional for Post-op pain   Induction: Intravenous  Airway Management Planned: Natural Airway and Simple Face Mask  Additional Equipment:   Intra-op Plan:   Post-operative Plan:   Informed Consent: I have reviewed the patients History and Physical, chart, labs and discussed the procedure including the risks, benefits and alternatives for the proposed anesthesia with the patient or authorized representative who has indicated his/her understanding and acceptance.   Dental advisory given  Plan Discussed with: CRNA  Anesthesia Plan Comments: (Mr. Kalkowski prefers same anesthetic as last time and declines adductor canal block. )      Anesthesia Quick Evaluation

## 2015-07-17 NOTE — Op Note (Signed)
PATIENT ID:      Dennis Martin  MRN:     [id number] DOB/AGE:    [birth year] / 58 y.o.       OPERATIVE REPORT    DATE OF PROCEDURE:  07/17/2015       PREOPERATIVE DIAGNOSIS:   LEFT KNEE OSTEOARTHRITIS      Estimated body mass index is 25.14 kg/(m^2) as calculated from the following:   Height as of this encounter: 5\' 11"  (1.803 m).   Weight as of this encounter: 81.733 kg (180 lb 3 oz).                                                        POSTOPERATIVE DIAGNOSIS:   LEFT KNEE OSTEOARTHRITIS                                                                      PROCEDURE:  Procedure(s): TOTAL KNEE ARTHROPLASTY Using DepuyAttune RP implants #7L Femur, #9Tibia, 5 mm Attune RP bearing, 41 Patella     SURGEON: Nadeem Romanoski J    ASSISTANT:   Eric K[employer]   (Present and scrubbed throughout the case, critical for assistance with exposure, retraction, instrumentation, and closure.)         ANESTHESIA: Spinal, 20cc Exparel, 20cc 0.5% Marcaine  EBL: 300  FLUID REPLACEMENT: 1600 crystalloid  TOURNIQUET TIME: 43min  Drains: None  Tranexamic Acid: 2gm topical   COMPLICATIONS:  None         INDICATIONS FOR PROCEDURE: The patient has  LEFT KNEE OSTEOARTHRITIS, Var deformities, XR shows bone on bone arthritis, lateral subluxation of tibia. Patient has failed all conservative measures including anti-inflammatory medicines, narcotics, attempts at  exercise and weight loss, cortisone injections and viscosupplementation.  Risks and benefits of surgery have been discussed, questions answered.   DESCRIPTION OF PROCEDURE: The patient identified by armband, received  IV antibiotics, in the holding area at [facility]. Patient taken to the operating room, appropriate anesthetic  monitors were attached, and spinal anesthesia was  induced. Tourniquet  applied high to the operative thigh. Lateral post and foot positioner  applied to the table, the lower extremity was then prepped and draped  in usual  sterile fashion from the toes to the tourniquet. Time-out procedure was performed. We began the operation, with the knee flexed 120 degrees, by making the anterior midline incision starting at handbreadth above the patella going over the patella 1 cm medial to and 4 cm distal to the tibial tubercle. Small bleeders in the skin and the  subcutaneous tissue identified and cauterized. Transverse retinaculum was incised and reflected medially and a medial parapatellar arthrotomy was accomplished. the patella was everted and theprepatellar fat pad resected. The superficial medial collateral  ligament was then elevated from anterior to posterior along the proximal  flare of the tibia and anterior half of the menisci resected. The knee was hyperflexed exposing bone on bone arthritis. Peripheral and notch osteophytes as well as the cruciate ligaments were then resected. We continued to  work our way around posteriorly along the proximal tibia,  and externally  rotated the tibia subluxing it out from underneath the femur. A McHale  retractor was placed through the notch and a lateral Hohmann retractor  placed, and we then drilled through the proximal tibia in line with the  axis of the tibia followed by an intramedullary guide rod and 2-degree  posterior slope cutting guide. The tibial cutting guide, 3 degree posterior sloped, was pinned into place allowing resection of 4 mm of bone medially and 10 mm of bone laterally. Satisfied with the tibial resection, we then  entered the distal femur 2 mm anterior to the PCL origin with the  intramedullary guide rod and applied the distal femoral cutting guide  set at 9 mm, with 5 degrees of valgus. This was pinned along the  epicondylar axis. At this point, the distal femoral cut was accomplished without difficulty. We then sized for a #7L femoral component and pinned the guide in 0 degrees of external rotation. The chamfer cutting guide was pinned into place. The anterior,  posterior, and chamfer cuts were accomplished without difficulty followed by  the Attune RP box cutting guide and the box cut. We also removed posterior osteophytes from the posterior femoral condyles. At this  time, the knee was brought into full extension. We checked our  extension and flexion gaps and found them symmetric for a 5 mm bearing. Distracting in extension with a lamina spreader, the posterior horns of the menisci were removed, and Exparel, diluted to 60 cc, with 20cc NS, and 20cc 0.5% Marcaine,was injected into the capsule and synovium of the knee. The posterior patella cut was accomplished with the 9.5 mm Attune cutting guide, sized for a 49mm dome, and the fixation pegs drilled.The knee  was then once again hyperflexed exposing the proximal tibia. We sized for a # 9 tibial base plate, applied the smokestack and the conical reamer followed by the the Delta fin keel punch. We then hammered into place the Attune RP trial femoral component, drilled the lugs, inserted a  5 mm trial bearing, trial patellar button, and took the knee through range of motion from 0-130 degrees. No thumb pressure was required for patellar Tracking. At this point, the limb was wrapped with an Esmarch bandage and the tourniquet inflated to 350 mmHg. All trial components were removed, mating surfaces irrigated with pulse lavage, and dried with suction and sponges. A double batch of DePuy HV cement with 1500 mg of Zinacef was mixed and applied to all bony metallic mating surfaces except for the posterior condyles of the femur itself. In order, we  hammered into place the tibial tray and removed excess cement, the femoral component and removed excess cement. The final Attune RP bearing  was inserted, and the knee brought to full extension with compression.  The patellar button was clamped into place, and excess cement  removed. While the cement cured the wound was irrigated out with normal saline solution pulse lavage.  Ligament stability and patellar tracking were checked and found to be excellent. The parapatellar arthrotomy was closed with  running #1 Vicryl suture. The subcutaneous tissue with 0 and 2-0 undyed  Vicryl suture, and the skin with running 3-0 SQ vicryl. A dressing of Xeroform,  4 x 4, dressing sponges, Webril, and Ace wrap applied. The patient  awakened, and taken to recovery room without difficulty.   Kerin Salen 07/17/2015, 12:42 PM

## 2015-07-17 NOTE — Anesthesia Procedure Notes (Signed)
Spinal Patient location during procedure: OR Start time: 07/17/2015 11:29 AM End time: 07/17/2015 11:31 AM Staffing Anesthesiologist: Suella Broad D Performed by: anesthesiologist  Preanesthetic Checklist Completed: patient identified, site marked, surgical consent, pre-op evaluation, timeout performed, IV checked, risks and benefits discussed and monitors and equipment checked Spinal Block Patient position: sitting Prep: Betadine Patient monitoring: heart rate, continuous pulse ox, blood pressure and cardiac monitor Approach: midline Location: L4-5 Injection technique: single-shot Needle Needle type: Introducer and Pencan  Needle gauge: 24 G Needle length: 9 cm Additional Notes Negative paresthesia. Negative blood return. Positive free-flowing CSF. Expiration date of kit checked and confirmed. Patient tolerated procedure well, without complications.

## 2015-07-18 ENCOUNTER — Encounter (HOSPITAL_COMMUNITY): Payer: Self-pay | Admitting: General Practice

## 2015-07-18 LAB — BASIC METABOLIC PANEL
Anion gap: 5 (ref 5–15)
BUN: 13 mg/dL (ref 6–20)
CALCIUM: 7.1 mg/dL — AB (ref 8.9–10.3)
CO2: 23 mmol/L (ref 22–32)
CREATININE: 0.92 mg/dL (ref 0.61–1.24)
Chloride: 98 mmol/L — ABNORMAL LOW (ref 101–111)
GFR calc non Af Amer: >60 mL/min (ref >60)
Glucose, Bld: 617 mg/dL (ref 65–99)
Potassium: 6.5 mmol/L (ref 3.5–5.1)
SODIUM: 126 mmol/L — AB (ref 135–145)

## 2015-07-18 LAB — CBC
HCT: 32.6 % — ABNORMAL LOW (ref 39.0–52.0)
Hemoglobin: 10.4 g/dL — ABNORMAL LOW (ref 13.0–17.0)
MCH: 32.9 pg (ref 26.0–34.0)
MCHC: 31.9 g/dL (ref 30.0–36.0)
MCV: 103.2 fL — ABNORMAL HIGH (ref 78.0–100.0)
PLATELETS: 333 10*3/uL (ref 150–400)
RBC: 3.16 MIL/uL — ABNORMAL LOW (ref 4.22–5.81)
RDW: 13.4 % (ref 11.5–15.5)
WBC: 9.1 10*3/uL (ref 4.0–10.5)

## 2015-07-18 LAB — POTASSIUM: POTASSIUM: 3.7 mmol/L (ref 3.5–5.1)

## 2015-07-18 LAB — GLUCOSE, RANDOM: GLUCOSE: 135 mg/dL — AB (ref 65–99)

## 2015-07-18 NOTE — Discharge Summary (Signed)
Patient ID: Dennis Martin MRN: [id number] DOB/AGE: 1956-03-14 60 y.o.  Admit date: 07/17/2015 Discharge date: 07/18/2015  Admission Diagnoses:  Principal Problem:   Primary osteoarthritis of left knee Active Problems:   Primary osteoarthritis of knee   Discharge Diagnoses:  Same  Past Medical History  Diagnosis Date  . BPH (benign prostatic hyperplasia)   . Hypertension   . Essential thrombocytosis ([place])   . History of stress test 2008    done in [place] ,nuclear stress test- told that it was normal  . Peripheral vascular disease ([place])   . DVT of leg (deep venous thrombosis) ([place]) 2001    post knee arthroscopy,also complicated by infection, PICC line used for long term antibiotics, was on coumadin x's 6 months  . Sleep apnea     did surgery for the problem  . Spleen enlarged   . Headache     relative to testosterone level, it had been a problem, improved with clomid  . Thrombocythemia ([place])     monitored by Dr. Earlie Server  . Arthritis     both arthritis, wrist, hands   . Anxiety     hx of - 10 years ago    Surgeries: Procedure(s): TOTAL KNEE ARTHROPLASTY on 07/17/2015   Consultants:    Discharged Condition: Improved  Hospital Course: Dennis Martin is an 60 y.o. male who was admitted 07/17/2015 for operative treatment ofPrimary osteoarthritis of left knee. Patient has severe unremitting pain that affects sleep, daily activities, and work/hobbies. After pre-op clearance the patient was taken to the operating room on 07/17/2015 and underwent  Procedure(s): TOTAL KNEE ARTHROPLASTY.    Patient was given perioperative antibiotics: Anti-infectives    Start     Dose/Rate Route Frequency Ordered Stop   07/17/15 1201  cefUROXime (ZINACEF) injection  Status:  Discontinued       As needed 07/17/15 1202 07/17/15 1315   07/17/15 1100  vancomycin (VANCOCIN) IVPB 1000 mg/200 mL premix     1,000 mg 200 mL/hr over 60 Minutes Intravenous To ShortStay Surgical 07/17/15 0752 07/17/15  1022       Patient was given sequential compression devices, early ambulation, and chemoprophylaxis to prevent DVT.  Patient benefited maximally from hospital stay and there were no complications.    Recent vital signs: Patient Vitals for the past 24 hrs:  BP Temp Temp src Pulse Resp SpO2  07/18/15 0500 (!) 145/65 mmHg 97.8 F (36.6 C) Oral 77 16 97 %  07/17/15 2300 136/64 mmHg 97.5 F (36.4 C) Oral 69 16 100 %  07/17/15 2206 133/60 mmHg 97.8 F (36.6 C) Oral 71 16 100 %  07/17/15 1614 (!) 141/83 mmHg 97.5 F (36.4 C) Oral 64 16 98 %  07/17/15 1540 (!) 141/80 mmHg 97.5 F (36.4 C) - 63 17 99 %  07/17/15 1520 137/78 mmHg - - (!) 59 18 99 %  07/17/15 1505 138/81 mmHg - - (!) 58 13 100 %  07/17/15 1450 134/80 mmHg - - (!) 53 12 100 %  07/17/15 1435 139/87 mmHg - - (!) 53 14 100 %  07/17/15 1430 139/87 mmHg - - (!) 54 12 100 %  07/17/15 1415 138/75 mmHg - - 63 12 99 %  07/17/15 1400 120/78 mmHg - - (!) 50 17 100 %  07/17/15 1345 122/85 mmHg - - 60 17 98 %  07/17/15 1335 114/63 mmHg - - 61 15 93 %  07/17/15 1320 101/66 mmHg 97.5 F (36.4 C) - 68 14 97 %  Recent laboratory studies:  Recent Labs  07/18/15 0251 07/18/15 0517  WBC 9.1  --   HGB 10.4*  --   HCT 32.6*  --   PLT 333  --   NA 126*  --   K 6.5* 3.7  CL 98*  --   CO2 23  --   BUN 13  --   CREATININE 0.92  --   GLUCOSE 617* 135*  CALCIUM 7.1*  --      Discharge Medications:     Medication List    STOP taking these medications        aspirin 81 MG tablet  Replaced by:  aspirin EC 325 MG tablet      TAKE these medications        anastrozole 1 MG tablet  Commonly known as:  ARIMIDEX  Take 1 mg by mouth daily. Pt to start today or tomorrow     aspirin EC 325 MG tablet  Take 1 tablet (325 mg total) by mouth 2 (two) times daily.     calcium carbonate 750 MG chewable tablet  Commonly known as:  TUMS EX  Chew 1 tablet by mouth daily as needed for heartburn.     cetaphil cream  Apply topically  as needed.     clomiPHENE 50 MG tablet  Commonly known as:  CLOMID  Take 50 mg by mouth at bedtime.     desonide 0.05 % cream  Commonly known as:  DESOWEN  Apply 1 application topically 2 (two) times daily as needed (rash).     folic acid A999333 MCG tablet  Commonly known as:  FOLVITE  Take 400 mcg by mouth daily.     hydrocortisone 2.5 % lotion  Apply 1 application topically daily as needed (rash/itching).     hydroxyurea 500 MG capsule  Commonly known as:  HYDREA  Take 500-1,000 mg by mouth at bedtime. Take 1 capsule (500 mg) daily except take 2 capsules (1000 mg) on Fridays May take with food to minimize GI side effects.     ketoconazole 2 % cream  Commonly known as:  NIZORAL  Apply 1 application topically daily.     ketoconazole 2 % cream  Commonly known as:  NIZORAL  Apply 1 application topically daily as needed for irritation.     lisinopril 10 MG tablet  Commonly known as:  PRINIVIL,ZESTRIL  Take 10 mg by mouth daily.     methocarbamol 500 MG tablet  Commonly known as:  ROBAXIN  Take 1 tablet (500 mg total) by mouth 2 (two) times daily with a meal.     OVER THE COUNTER MEDICATION  Vitafusion men's complete vitamin gummies; pt chews two gummies daily.     oxyCODONE-acetaminophen 5-325 MG tablet  Commonly known as:  ROXICET  Take 1 tablet by mouth every 4 (four) hours as needed.     sildenafil 100 MG tablet  Commonly known as:  VIAGRA  Take 0.5-1 tablets (50-100 mg total) by mouth daily as needed for erectile dysfunction.     tamsulosin 0.4 MG Caps capsule  Commonly known as:  FLOMAX  Take 0.4 mg by mouth at bedtime.     triamcinolone cream 0.1 %  Commonly known as:  KENALOG  Apply 1 application topically 2 (two) times daily as needed (skin irritation).        Diagnostic Studies: No results found.  Disposition: 06-Home-Health Care Svc      Discharge Instructions    CPM  Complete by:  As directed   Continuous passive motion machine (CPM):       Use the CPM from 0 to 60  for 5 hours per day.      You may increase by 10 degrees per day.  You may break it up into 2 or 3 sessions per day.      Use CPM for 2 weeks or until you are told to stop.     Call MD / Call 911    Complete by:  As directed   If you experience chest pain or shortness of breath, CALL 911 and be transported to the hospital emergency room.  If you develope a fever above 101 F, pus (white drainage) or increased drainage or redness at the wound, or calf pain, call your surgeon's office.     Constipation Prevention    Complete by:  As directed   Drink plenty of fluids.  Prune juice may be helpful.  You may use a stool softener, such as Colace (over the counter) 100 mg twice a day.  Use MiraLax (over the counter) for constipation as needed.     Diet - low sodium heart healthy    Complete by:  As directed      Driving restrictions    Complete by:  As directed   No driving for 2 weeks     Increase activity slowly as tolerated    Complete by:  As directed      Patient may shower    Complete by:  As directed   You may shower without a dressing once there is no drainage.  Do not wash over the wound.  If drainage remains, cover wound with plastic wrap and then shower.           Follow-up Information    Follow up with Kerin Salen, MD In 2 weeks.   Specialty:  Orthopedic Surgery   Contact information:   [place] [address] [phone]       Follow up with [facility].   Why:  Someone from [facility] will contact you concerning start date and time for therapy.   Contact information:   [address] [address] [phone]        Signed: Hardin Negus, Honesty Menta R 07/18/2015, 10:19 AM

## 2015-07-18 NOTE — Anesthesia Postprocedure Evaluation (Signed)
Anesthesia Post Note  Patient: Dennis Martin  Procedure(s) Performed: Procedure(s) (LRB): TOTAL KNEE ARTHROPLASTY (Left)  Patient location during evaluation: PACU Anesthesia Type: Spinal Level of consciousness: oriented and awake and alert Pain management: pain level controlled Vital Signs Assessment: post-procedure vital signs reviewed and stable Respiratory status: spontaneous breathing, respiratory function stable and patient connected to nasal cannula oxygen Cardiovascular status: blood pressure returned to baseline and stable Postop Assessment: no headache, no backache and spinal receding Anesthetic complications: no    Last Vitals:  Filed Vitals:   07/17/15 2300 07/18/15 0500  BP: 136/64 145/65  Pulse: 69 77  Temp: 36.4 C 36.6 C  Resp: 16 16    Last Pain:  Filed Vitals:   07/18/15 1007  PainSc: [place]

## 2015-07-18 NOTE — Progress Notes (Signed)
CRITICAL VALUE ALERT  Critical value received:  Glucose 617  Date of notification:  07/18/2015  Time of notification:  [id number]  Critical value read back:Yes.    Nurse who received alert:  Orlean Patten, RN  MD notified (1st page):  Loni Dolly, PA  Time of first page:  [phone]  MD notified (2nd page):  Time of second page:  Responding MD:  Loni Dolly, PA  Time MD responded:  [phone]

## 2015-07-18 NOTE — Progress Notes (Signed)
CRITICAL VALUE ALERT  Critical value received:  Potassium 6.5  Date of notification:  07/18/2015  Time of notification:  [id number]  Critical value read back:Yes.    Nurse who received alert:  Julien Nordmann, RN  MD notified (1st page):  Loni Dolly, PA  Time of first page:  [phone]  MD notified (2nd page):  Time of second page:  Responding MD:  Loni Dolly, PA  Time MD responded:  [phone]

## 2015-07-18 NOTE — Evaluation (Signed)
Occupational Therapy Evaluation and Discharge Summary Patient Details Name: Natas Muscari MRN: [id number] DOB: Feb 28, 1956 Today's Date: 07/18/2015    History of Present Illness 60 yo admitted for Lt TKA with PMHx: Rt TKA, BPH, HTN, anxiety, PVD, sleep apnea   Clinical Impression   Pt admitted with the above diagnosis and overall is doing quite well with adls.  Pt does no need assist with basic adls at this time with use of walker and BSC over toilet.  Wife to be at home with pt.  No further OT needs.    Follow Up Recommendations  Supervision - Intermittent;No OT follow up    Equipment Recommendations  None recommended by OT    Recommendations for Other Services       Precautions / Restrictions Precautions Precautions: Knee Restrictions Weight Bearing Restrictions: Yes LLE Weight Bearing: Weight bearing as tolerated      Mobility Bed Mobility Overal bed mobility: Modified Independent                Transfers Overall transfer level: Modified independent Equipment used: Rolling walker (2 wheeled)             General transfer comment: no assist needed.    Balance Overall balance assessment: No apparent balance deficits (not formally assessed)                                          ADL Overall ADL's : Modified independent                                       General ADL Comments: Pt has had total knee replacement on other knee and is quite familiar with adl routines.  Pt is independent with all basic adls with use of walker and BSC over toilet at this time.       Vision     Perception     Praxis      Pertinent Vitals/Pain Pain Assessment: 0-10 Pain Score: 5  Pain Location: l knee Pain Descriptors / Indicators: Aching Pain Intervention(s): Limited activity within patient's tolerance;Monitored during session;Ice applied     Hand Dominance Right   Extremity/Trunk Assessment Upper Extremity Assessment Upper  Extremity Assessment: Overall WFL for tasks assessed   Lower Extremity Assessment Lower Extremity Assessment: Defer to PT evaluation LLE Deficits / Details: decreased ROM and strength as expected post op   Cervical / Trunk Assessment Cervical / Trunk Assessment: Normal   Communication Communication Communication: No difficulties   Cognition Arousal/Alertness: Awake/alert Behavior During Therapy: WFL for tasks assessed/performed Overall Cognitive Status: Within Functional Limits for tasks assessed                     General Comments       Exercises       Shoulder Instructions      Home Living Family/patient expects to be discharged to:: Private residence Living Arrangements: Spouse/significant other Available Help at Discharge: Family;Available 24 hours/day Type of Home: House Home Access: Stairs to enter [employer] of Steps: 1 Entrance Stairs-Rails: None Home Layout: One level     Bathroom Shower/Tub: Walk-in shower;Door   [employer] Toilet: Standard     Home Equipment: Bedside commode;Walker - 2 wheels;Cane - single point          Prior  Functioning/Environment Level of Independence: Independent             OT Diagnosis:     OT Problem List:     OT Treatment/Interventions:      OT Goals(Current goals can be found in the care plan section) Acute Rehab OT Goals Patient Stated Goal: kayaking and fishing OT Goal Formulation: All assessment and education complete, DC therapy  OT Frequency:     Barriers to D/C:            Co-evaluation              End of Session Equipment Utilized During Treatment: Rolling walker CPM Left Knee CPM Left Knee: Off Nurse Communication: Mobility status  Activity Tolerance: Patient tolerated treatment well Patient left: in chair;with call bell/phone within reach;with family/visitor present   Time: [id number] OT Time Calculation (min): 10 min Charges:  OT General Charges $OT Visit: 1  Procedure OT Evaluation $OT Eval Low Complexity: 1 Procedure G-Codes:    Glenford Peers 07-26-15, 10:37 AM [phone]

## 2015-07-18 NOTE — Progress Notes (Signed)
Patient ID: Dennis Martin, male   DOB: 1955-09-09, 60 y.o.   MRN: [id number] PATIENT ID: Dennis Martin  MRN: [id number]  DOB/AGE:  03/17/1956 / 59 y.o.  1 Day Post-Op Procedure(s) (LRB): TOTAL KNEE ARTHROPLASTY (Left)    PROGRESS NOTE Subjective: Patient is alert, oriented, no Nausea, no Vomiting, yes passing gas. Taking PO well. Denies SOB, Chest or Calf Pain. Using Incentive Spirometer, PAS in place. Ambulate patient is walked in the room without difficulty has not had PT evaluation as yet, CPM 0-90 Patient reports pain as 3/10 .    Objective: Vital signs in last 24 hours: Filed Vitals:   07/17/15 1614 07/17/15 2206 07/17/15 2300 07/18/15 0500  BP: 141/83 133/60 136/64 145/65  Pulse: 64 71 69 77  Temp: 97.5 F (36.4 C) 97.8 F (36.6 C) 97.5 F (36.4 C) 97.8 F (36.6 C)  TempSrc: Oral Oral Oral Oral  Resp: 16 16 16 16   Height:      Weight:      SpO2: 98% 100% 100% 97%      Intake/Output from previous day: I/O last 3 completed shifts: In: 600 [I.V.:600] Out: 100 [Blood:100]   Intake/Output this shift:     LABORATORY DATA:  Recent Labs  07/18/15 0251 07/18/15 0517  WBC 9.1  --   HGB 10.4*  --   HCT 32.6*  --   PLT 333  --   NA 126*  --   K 6.5* 3.7  CL 98*  --   CO2 23  --   BUN 13  --   CREATININE 0.92  --   GLUCOSE 617* 135*  CALCIUM 7.1*  --   First set of labs were probably drawn just above his IV, repeat labs showed normal potassium and a very mildly elevated glucose.  Examination: Neurologically intact ABD soft Neurovascular intact Sensation intact distally Intact pulses distally Dorsiflexion/Plantar flexion intact Incision: dressing C/D/I No cellulitis present Compartment soft}  Assessment:   1 Day Post-Op Procedure(s) (LRB): TOTAL KNEE ARTHROPLASTY (Left) ADDITIONAL DIAGNOSIS: Expected Acute Blood Loss Anemia, Hypertension, chronic thrombocytosis  Plan: PT/OT WBAT, CPM 5/hrs day until ROM 0-90 degrees, then D/C CPM DVT Prophylaxis:   SCDx72hrs, ASA 325 mg BID x 2 weeks DISCHARGE PLAN: Home, probably today when he passes his physical therapy goals DISCHARGE NEEDS: HHPT, CPM, Walker and 3-in-1 comode seat     Peggyann Zwiefelhofer J 07/18/2015, 7:54 AM

## 2015-07-18 NOTE — Care Management Note (Signed)
Case Management Note  Patient Details  Name: Dennis Martin MRN: [id number] Date of Birth: [birth year]  Subjective/Objective:   60 yr old gentleman s/p left total knee arthroplasty.                 Action/Plan: Case manager spoke with patient via telephone concerning home health and DME needs. Choice was offered. Patient states he used [facility] with previous surgery and wants to use them again. Case manager called referral to Christa See RN, [facility] Liaison. Patient states he has rolling walker, 3in1. CPM will be delivered to his home. Patient has family support at discharge.  Expected Discharge Date:   07/18/15               Expected Discharge Plan:  [place]  In-House Referral:  NA  Discharge planning Services  CM Consult  Post Acute Care Choice:  Durable Medical Equipment, Home Health Choice offered to:  Patient  DME Arranged:  CPM DME Agency:  [employer]  HH Arranged:  PT HH Agency:  [facility]  Status of Service:  Completed, signed off  Medicare Important Message Given:    Date Medicare IM Given:    Medicare IM give by:    Date Additional Medicare IM Given:    Additional Medicare Important Message give by:     If discussed at [place] of Stay Meetings, dates discussed:    Additional Comments:  Ninfa Meeker, RN 07/18/2015, 9:34 AM

## 2015-07-18 NOTE — Progress Notes (Signed)
Regarding critical lab results for potassium and glucose: notified on call PA, Loni Dolly. Received verbal orders for STAT lab re-draws and to saline lock IV. Will follow and continue to monitor.

## 2015-07-18 NOTE — Evaluation (Signed)
Physical Therapy Evaluation Patient Details Name: Dennis Martin MRN: [id number] DOB: [birth year] Today's Date: 07/18/2015   History of Present Illness  60 yo admitted for Lt TKA with PMHx: Rt TKA, BPH, HTN, anxiety, PVD, sleep apnea  Clinical Impression  Pt moving exceptionally well for first time up. Pt familiar with progression, precaution and plan from prior TKA. Pt educated for CPM use, bone foam, HEP, progression and plan. Pt eager to return home and has met needed acute mobility for return home. Pt with decreased strength, ROM, gait and function who will benefit from acute therapy to maximize ROM, function and gait to return pt to independent level.     Follow Up Recommendations Home health PT    Equipment Recommendations  None recommended by PT    Recommendations for Other Services       Precautions / Restrictions Precautions Precautions: Knee Restrictions Weight Bearing Restrictions: Yes LLE Weight Bearing: Weight bearing as tolerated      Mobility  Bed Mobility Overal bed mobility: Modified Independent                Transfers Overall transfer level: Modified independent                  Ambulation/Gait Ambulation/Gait assistance: Supervision Ambulation Distance (Feet): 500 Feet Assistive device: Standard walker Gait Pattern/deviations: Step-through pattern;Decreased stride length   Gait velocity interpretation: Below normal speed for age/gender General Gait Details: cues for increased dorsiflexion  Stairs Stairs: Yes Stairs assistance: Modified independent (Device/Increase time) Stair Management: Backwards;With walker Number of Stairs: 1 General stair comments: x 5 trials per pt choice  Wheelchair Mobility    Modified Rankin (Stroke Patients Only)       Balance Overall balance assessment: No apparent balance deficits (not formally assessed)                                           Pertinent Vitals/Pain Pain  Assessment: 0-10 Pain Score: 6  Pain Location: left knee Pain Descriptors / Indicators: Aching Pain Intervention(s): Limited activity within patient's tolerance;Monitored during session    Home Living Family/patient expects to be discharged to:: Private residence Living Arrangements: Spouse/significant other Available Help at Discharge: Family;Available 24 hours/day Type of Home: House Home Access: Stairs to enter Entrance Stairs-Rails: None Entrance Stairs-Number of Steps: 1 Home Layout: One level Home Equipment: Bedside commode;Walker - 2 wheels;Cane - single point      Prior Function Level of Independence: Independent               Hand Dominance        Extremity/Trunk Assessment   Upper Extremity Assessment: Overall WFL for tasks assessed           Lower Extremity Assessment: LLE deficits/detail   LLE Deficits / Details: decreased ROM and strength as expected post op  Cervical / Trunk Assessment: Normal  Communication   Communication: No difficulties  Cognition Arousal/Alertness: Awake/alert Behavior During Therapy: WFL for tasks assessed/performed Overall Cognitive Status: Within Functional Limits for tasks assessed                      General Comments      Exercises Total Joint Exercises Quad Sets: AROM;Left;10 reps;Supine Heel Slides: AAROM;Left;10 reps;Supine Straight Leg Raises: AROM;Left;10 reps;Supine Goniometric ROM: 6-85      Assessment/Plan    PT Assessment Patient needs continued  PT services  PT Diagnosis Acute pain;Difficulty walking   PT Problem List Decreased strength;Decreased range of motion;Decreased activity tolerance;Decreased mobility;Pain  PT Treatment Interventions Gait training;Functional mobility training;Therapeutic activities;Therapeutic exercise;DME instruction;Patient/family education   PT Goals (Current goals can be found in the Care Plan section) Acute Rehab PT Goals Patient Stated Goal: kayaking and  fishing PT Goal Formulation: With patient Time For Goal Achievement: 07/25/15 Potential to Achieve Goals: Good    Frequency 7X/week   Barriers to discharge        Co-evaluation               End of Session Equipment Utilized During Treatment: Gait belt Activity Tolerance: Patient tolerated treatment well Patient left: in chair           Time: 1504-1364 PT Time Calculation (min) (ACUTE ONLY): 32 min   Charges:   PT Evaluation $PT Eval Moderate Complexity: 1 Procedure PT Treatments $Gait Training: 8-22 mins   PT G CodesMelford Aase 07/18/2015, 8:15 AM Elwyn Reach, [place]

## 2015-07-20 DIAGNOSIS — N401 Enlarged prostate with lower urinary tract symptoms: Secondary | ICD-10-CM | POA: Diagnosis not present

## 2015-07-20 DIAGNOSIS — R350 Frequency of micturition: Secondary | ICD-10-CM | POA: Diagnosis not present

## 2015-07-20 DIAGNOSIS — E291 Testicular hypofunction: Secondary | ICD-10-CM | POA: Diagnosis not present

## 2015-07-20 DIAGNOSIS — I10 Essential (primary) hypertension: Secondary | ICD-10-CM | POA: Diagnosis not present

## 2015-07-20 DIAGNOSIS — G4700 Insomnia, unspecified: Secondary | ICD-10-CM | POA: Diagnosis not present

## 2015-07-20 DIAGNOSIS — Z96653 Presence of artificial knee joint, bilateral: Secondary | ICD-10-CM | POA: Diagnosis not present

## 2015-07-20 DIAGNOSIS — Z471 Aftercare following joint replacement surgery: Secondary | ICD-10-CM | POA: Diagnosis not present

## 2015-07-20 DIAGNOSIS — R3912 Poor urinary stream: Secondary | ICD-10-CM | POA: Diagnosis not present

## 2015-07-20 DIAGNOSIS — Z87891 Personal history of nicotine dependence: Secondary | ICD-10-CM | POA: Diagnosis not present

## 2015-07-20 DIAGNOSIS — Z86718 Personal history of other venous thrombosis and embolism: Secondary | ICD-10-CM | POA: Diagnosis not present

## 2015-07-21 DIAGNOSIS — Z86718 Personal history of other venous thrombosis and embolism: Secondary | ICD-10-CM | POA: Diagnosis not present

## 2015-07-21 DIAGNOSIS — E291 Testicular hypofunction: Secondary | ICD-10-CM | POA: Diagnosis not present

## 2015-07-21 DIAGNOSIS — G4700 Insomnia, unspecified: Secondary | ICD-10-CM | POA: Diagnosis not present

## 2015-07-21 DIAGNOSIS — N401 Enlarged prostate with lower urinary tract symptoms: Secondary | ICD-10-CM | POA: Diagnosis not present

## 2015-07-21 DIAGNOSIS — Z96653 Presence of artificial knee joint, bilateral: Secondary | ICD-10-CM | POA: Diagnosis not present

## 2015-07-21 DIAGNOSIS — Z87891 Personal history of nicotine dependence: Secondary | ICD-10-CM | POA: Diagnosis not present

## 2015-07-21 DIAGNOSIS — I10 Essential (primary) hypertension: Secondary | ICD-10-CM | POA: Diagnosis not present

## 2015-07-21 DIAGNOSIS — R3912 Poor urinary stream: Secondary | ICD-10-CM | POA: Diagnosis not present

## 2015-07-21 DIAGNOSIS — Z471 Aftercare following joint replacement surgery: Secondary | ICD-10-CM | POA: Diagnosis not present

## 2015-07-21 DIAGNOSIS — R350 Frequency of micturition: Secondary | ICD-10-CM | POA: Diagnosis not present

## 2015-07-24 DIAGNOSIS — R350 Frequency of micturition: Secondary | ICD-10-CM | POA: Diagnosis not present

## 2015-07-24 DIAGNOSIS — I10 Essential (primary) hypertension: Secondary | ICD-10-CM | POA: Diagnosis not present

## 2015-07-24 DIAGNOSIS — Z96653 Presence of artificial knee joint, bilateral: Secondary | ICD-10-CM | POA: Diagnosis not present

## 2015-07-24 DIAGNOSIS — G4700 Insomnia, unspecified: Secondary | ICD-10-CM | POA: Diagnosis not present

## 2015-07-24 DIAGNOSIS — Z87891 Personal history of nicotine dependence: Secondary | ICD-10-CM | POA: Diagnosis not present

## 2015-07-24 DIAGNOSIS — R3912 Poor urinary stream: Secondary | ICD-10-CM | POA: Diagnosis not present

## 2015-07-24 DIAGNOSIS — E291 Testicular hypofunction: Secondary | ICD-10-CM | POA: Diagnosis not present

## 2015-07-24 DIAGNOSIS — Z86718 Personal history of other venous thrombosis and embolism: Secondary | ICD-10-CM | POA: Diagnosis not present

## 2015-07-24 DIAGNOSIS — N401 Enlarged prostate with lower urinary tract symptoms: Secondary | ICD-10-CM | POA: Diagnosis not present

## 2015-07-24 DIAGNOSIS — Z471 Aftercare following joint replacement surgery: Secondary | ICD-10-CM | POA: Diagnosis not present

## 2015-07-26 ENCOUNTER — Institutional Professional Consult (permissible substitution): Payer: BLUE CROSS/BLUE SHIELD | Admitting: Pulmonary Disease

## 2015-07-26 DIAGNOSIS — Z471 Aftercare following joint replacement surgery: Secondary | ICD-10-CM | POA: Diagnosis not present

## 2015-07-26 DIAGNOSIS — Z87891 Personal history of nicotine dependence: Secondary | ICD-10-CM | POA: Diagnosis not present

## 2015-07-26 DIAGNOSIS — N401 Enlarged prostate with lower urinary tract symptoms: Secondary | ICD-10-CM | POA: Diagnosis not present

## 2015-07-26 DIAGNOSIS — E291 Testicular hypofunction: Secondary | ICD-10-CM | POA: Diagnosis not present

## 2015-07-26 DIAGNOSIS — G4700 Insomnia, unspecified: Secondary | ICD-10-CM | POA: Diagnosis not present

## 2015-07-26 DIAGNOSIS — I10 Essential (primary) hypertension: Secondary | ICD-10-CM | POA: Diagnosis not present

## 2015-07-26 DIAGNOSIS — Z86718 Personal history of other venous thrombosis and embolism: Secondary | ICD-10-CM | POA: Diagnosis not present

## 2015-07-26 DIAGNOSIS — R350 Frequency of micturition: Secondary | ICD-10-CM | POA: Diagnosis not present

## 2015-07-26 DIAGNOSIS — R3912 Poor urinary stream: Secondary | ICD-10-CM | POA: Diagnosis not present

## 2015-07-26 DIAGNOSIS — Z96653 Presence of artificial knee joint, bilateral: Secondary | ICD-10-CM | POA: Diagnosis not present

## 2015-07-28 DIAGNOSIS — Z471 Aftercare following joint replacement surgery: Secondary | ICD-10-CM | POA: Diagnosis not present

## 2015-07-28 DIAGNOSIS — G4700 Insomnia, unspecified: Secondary | ICD-10-CM | POA: Diagnosis not present

## 2015-07-28 DIAGNOSIS — I10 Essential (primary) hypertension: Secondary | ICD-10-CM | POA: Diagnosis not present

## 2015-07-28 DIAGNOSIS — Z96653 Presence of artificial knee joint, bilateral: Secondary | ICD-10-CM | POA: Diagnosis not present

## 2015-07-28 DIAGNOSIS — E291 Testicular hypofunction: Secondary | ICD-10-CM | POA: Diagnosis not present

## 2015-07-28 DIAGNOSIS — Z86718 Personal history of other venous thrombosis and embolism: Secondary | ICD-10-CM | POA: Diagnosis not present

## 2015-07-28 DIAGNOSIS — Z87891 Personal history of nicotine dependence: Secondary | ICD-10-CM | POA: Diagnosis not present

## 2015-07-28 DIAGNOSIS — N401 Enlarged prostate with lower urinary tract symptoms: Secondary | ICD-10-CM | POA: Diagnosis not present

## 2015-07-28 DIAGNOSIS — R350 Frequency of micturition: Secondary | ICD-10-CM | POA: Diagnosis not present

## 2015-07-28 DIAGNOSIS — R3912 Poor urinary stream: Secondary | ICD-10-CM | POA: Diagnosis not present

## 2015-07-31 DIAGNOSIS — I10 Essential (primary) hypertension: Secondary | ICD-10-CM | POA: Diagnosis not present

## 2015-07-31 DIAGNOSIS — R350 Frequency of micturition: Secondary | ICD-10-CM | POA: Diagnosis not present

## 2015-07-31 DIAGNOSIS — N401 Enlarged prostate with lower urinary tract symptoms: Secondary | ICD-10-CM | POA: Diagnosis not present

## 2015-07-31 DIAGNOSIS — G4700 Insomnia, unspecified: Secondary | ICD-10-CM | POA: Diagnosis not present

## 2015-07-31 DIAGNOSIS — R3912 Poor urinary stream: Secondary | ICD-10-CM | POA: Diagnosis not present

## 2015-07-31 DIAGNOSIS — Z471 Aftercare following joint replacement surgery: Secondary | ICD-10-CM | POA: Diagnosis not present

## 2015-07-31 DIAGNOSIS — Z86718 Personal history of other venous thrombosis and embolism: Secondary | ICD-10-CM | POA: Diagnosis not present

## 2015-07-31 DIAGNOSIS — E291 Testicular hypofunction: Secondary | ICD-10-CM | POA: Diagnosis not present

## 2015-07-31 DIAGNOSIS — Z96653 Presence of artificial knee joint, bilateral: Secondary | ICD-10-CM | POA: Diagnosis not present

## 2015-07-31 DIAGNOSIS — Z87891 Personal history of nicotine dependence: Secondary | ICD-10-CM | POA: Diagnosis not present

## 2015-08-01 DIAGNOSIS — Z9889 Other specified postprocedural states: Secondary | ICD-10-CM | POA: Diagnosis not present

## 2015-08-01 DIAGNOSIS — Z96652 Presence of left artificial knee joint: Secondary | ICD-10-CM | POA: Diagnosis not present

## 2015-08-03 DIAGNOSIS — M25662 Stiffness of left knee, not elsewhere classified: Secondary | ICD-10-CM | POA: Diagnosis not present

## 2015-08-03 DIAGNOSIS — Z96652 Presence of left artificial knee joint: Secondary | ICD-10-CM | POA: Diagnosis not present

## 2015-08-03 DIAGNOSIS — M25562 Pain in left knee: Secondary | ICD-10-CM | POA: Diagnosis not present

## 2015-08-08 ENCOUNTER — Ambulatory Visit (HOSPITAL_COMMUNITY)
Admission: RE | Admit: 2015-08-08 | Discharge: 2015-08-08 | Disposition: A | Discharge status: Home / Self Care | Payer: BLUE CROSS/BLUE SHIELD | Source: Ambulatory Visit | Attending: Orthopedic Surgery | Admitting: Orthopedic Surgery

## 2015-08-08 ENCOUNTER — Other Ambulatory Visit (HOSPITAL_COMMUNITY): Payer: Self-pay | Admitting: Orthopedic Surgery

## 2015-08-08 DIAGNOSIS — M79605 Pain in left leg: Secondary | ICD-10-CM | POA: Diagnosis not present

## 2015-08-08 DIAGNOSIS — M7989 Other specified soft tissue disorders: Principal | ICD-10-CM

## 2015-08-08 DIAGNOSIS — Z7982 Long term (current) use of aspirin: Secondary | ICD-10-CM | POA: Insufficient documentation

## 2015-08-08 DIAGNOSIS — M79602 Pain in left arm: Secondary | ICD-10-CM | POA: Diagnosis not present

## 2015-08-08 DIAGNOSIS — Z86718 Personal history of other venous thrombosis and embolism: Secondary | ICD-10-CM | POA: Insufficient documentation

## 2015-08-08 DIAGNOSIS — M25562 Pain in left knee: Secondary | ICD-10-CM | POA: Diagnosis not present

## 2015-08-08 NOTE — Progress Notes (Signed)
VASCULAR LAB PRELIMINARY  PRELIMINARY  PRELIMINARY  PRELIMINARY  Left lower extremity venous duplex completed.    Preliminary report:  Left:  No evidence of DVT, superficial thrombosis, or Baker's cyst.  Emelie Newsom, RVT 08/08/2015, 3:37 PM

## 2015-08-10 DIAGNOSIS — Z96652 Presence of left artificial knee joint: Secondary | ICD-10-CM | POA: Diagnosis not present

## 2015-08-10 DIAGNOSIS — M25662 Stiffness of left knee, not elsewhere classified: Secondary | ICD-10-CM | POA: Diagnosis not present

## 2015-08-10 DIAGNOSIS — M25562 Pain in left knee: Secondary | ICD-10-CM | POA: Diagnosis not present

## 2015-08-15 DIAGNOSIS — Z96652 Presence of left artificial knee joint: Secondary | ICD-10-CM | POA: Diagnosis not present

## 2015-08-15 DIAGNOSIS — M25562 Pain in left knee: Secondary | ICD-10-CM | POA: Diagnosis not present

## 2015-08-15 DIAGNOSIS — M25662 Stiffness of left knee, not elsewhere classified: Secondary | ICD-10-CM | POA: Diagnosis not present

## 2015-08-17 DIAGNOSIS — M25562 Pain in left knee: Secondary | ICD-10-CM | POA: Diagnosis not present

## 2015-08-17 DIAGNOSIS — M25662 Stiffness of left knee, not elsewhere classified: Secondary | ICD-10-CM | POA: Diagnosis not present

## 2015-08-17 DIAGNOSIS — Z96652 Presence of left artificial knee joint: Secondary | ICD-10-CM | POA: Diagnosis not present

## 2015-08-22 DIAGNOSIS — E291 Testicular hypofunction: Secondary | ICD-10-CM | POA: Diagnosis not present

## 2015-08-22 DIAGNOSIS — M25562 Pain in left knee: Secondary | ICD-10-CM | POA: Diagnosis not present

## 2015-08-22 DIAGNOSIS — Z96652 Presence of left artificial knee joint: Secondary | ICD-10-CM | POA: Diagnosis not present

## 2015-08-22 DIAGNOSIS — M25662 Stiffness of left knee, not elsewhere classified: Secondary | ICD-10-CM | POA: Diagnosis not present

## 2015-08-22 DIAGNOSIS — R350 Frequency of micturition: Secondary | ICD-10-CM | POA: Diagnosis not present

## 2015-08-24 DIAGNOSIS — M25662 Stiffness of left knee, not elsewhere classified: Secondary | ICD-10-CM | POA: Diagnosis not present

## 2015-08-24 DIAGNOSIS — Z96652 Presence of left artificial knee joint: Secondary | ICD-10-CM | POA: Diagnosis not present

## 2015-08-24 DIAGNOSIS — M25562 Pain in left knee: Secondary | ICD-10-CM | POA: Diagnosis not present

## 2015-08-29 DIAGNOSIS — M25662 Stiffness of left knee, not elsewhere classified: Secondary | ICD-10-CM | POA: Diagnosis not present

## 2015-08-29 DIAGNOSIS — Z96652 Presence of left artificial knee joint: Secondary | ICD-10-CM | POA: Diagnosis not present

## 2015-08-29 DIAGNOSIS — M25562 Pain in left knee: Secondary | ICD-10-CM | POA: Diagnosis not present

## 2015-08-30 ENCOUNTER — Encounter: Payer: Self-pay | Admitting: Pulmonary Disease

## 2015-08-30 ENCOUNTER — Ambulatory Visit (INDEPENDENT_AMBULATORY_CARE_PROVIDER_SITE_OTHER): Payer: BLUE CROSS/BLUE SHIELD | Admitting: Pulmonary Disease

## 2015-08-30 VITALS — BP 112/62 | HR 77 | Ht 71.0 in | Wt 177.0 lb

## 2015-08-30 DIAGNOSIS — G4733 Obstructive sleep apnea (adult) (pediatric): Secondary | ICD-10-CM | POA: Insufficient documentation

## 2015-08-30 DIAGNOSIS — G4710 Hypersomnia, unspecified: Secondary | ICD-10-CM | POA: Diagnosis not present

## 2015-08-30 DIAGNOSIS — R0609 Other forms of dyspnea: Secondary | ICD-10-CM | POA: Insufficient documentation

## 2015-08-30 NOTE — Progress Notes (Signed)
Subjective:    Patient ID: Dennis Martin, male    DOB: February 24, 1956, 60 y.o.   MRN: [id number]  HPI   This is the case of Dennis Martin, 60 y.o. Male, who was referred by Dr. Arnette Norris  in consultation regarding OSA.    As you very well know, patient was dxed with OSA in the 90s. Lab study. Likely severe. Had laser assisted Uvulopalatoplasty c/o [facility] c/o Dr. Evangeline Gula.  His sx improved.   Through the years, he slowly has been having hypersomnia. Hypersomnia has gotten worse the last yr or so. Has snoring, choking, gasping.  Hypersomnia affects fxnality.  Sleeps 7 hrs/night. (-) abnormal behavior in sleep.   Significant smoker but (-) symptomatic exertional SOB. Quit in 2005.     Review of Systems  Constitutional: Negative.  Negative for fever and unexpected weight change.  HENT: Positive for rhinorrhea. Negative for congestion, dental problem, ear pain, nosebleeds, postnasal drip, sinus pressure, sneezing, sore throat and trouble swallowing.   Eyes: Negative.  Negative for redness and itching.  Respiratory: Negative.  Negative for cough, chest tightness, shortness of breath and wheezing.   Cardiovascular: Negative.  Negative for palpitations and leg swelling.  Gastrointestinal: Negative.  Negative for nausea and vomiting.  Endocrine: Negative.   Genitourinary: Negative.  Negative for dysuria.  Musculoskeletal: Positive for joint swelling and arthralgias.  Skin: Negative.  Negative for rash.  Allergic/Immunologic: Negative.   Neurological: Negative.  Negative for headaches.  Hematological: Negative.  Does not bruise/bleed easily.  Psychiatric/Behavioral: Negative.  Negative for dysphoric mood. The patient is not nervous/anxious.    Past Medical History  Diagnosis Date  . BPH (benign prostatic hyperplasia)   . Hypertension   . Essential thrombocytosis ([place])   . History of stress test 2008    done in [place] ,nuclear stress test- told that it was normal  .  Peripheral vascular disease ([place])   . DVT of leg (deep venous thrombosis) ([place]) 2001    post knee arthroscopy,also complicated by infection, PICC line used for long term antibiotics, was on coumadin x's 6 months  . Sleep apnea     did surgery for the problem  . Spleen enlarged   . Headache     relative to testosterone level, it had been a problem, improved with clomid  . Thrombocythemia ([place])     monitored by Dr. Earlie Server  . Arthritis     both arthritis, wrist, hands   . Anxiety     hx of - 10 years ago    (-) CA  Family History  Problem Relation Age of Onset  . Stomach cancer Neg Hx   . Colon cancer Paternal Aunt 21     Past Surgical History  Procedure Laterality Date  . Ulnar nerve transposition  2007    left  . Knee surgery Bilateral 2001  . Tonsillectomy    . Eye surgery      chalazion on both eyelids  . Nasal sinus surgery    . Total knee arthroplasty Right 05/22/2015  . Total knee arthroplasty Right 05/22/2015    Procedure: TOTAL KNEE ARTHROPLASTY;  Surgeon: Frederik Pear, MD;  Location: [place];  Service: Orthopedics;  Laterality: Right;  . Total knee arthroplasty Left 07/17/2015  . Total knee arthroplasty Left 07/17/2015    Procedure: TOTAL KNEE ARTHROPLASTY;  Surgeon: Frederik Pear, MD;  Location: [place];  Service: Orthopedics;  Laterality: Left;    Social History   Social History  . Marital  Status: Married    Spouse Name: N/A  . Number of Children: N/A  . Years of Education: N/A   Occupational History  . Not on file.   Social History Main Topics  . Smoking status: Former Smoker    Quit date: 05/06/2003  . Smokeless tobacco: Never Used     Comment: Quit 2005  . Alcohol Use: 8.4 oz/week    14 Cans of beer per week     Comment: 2 beers daily  . Drug Use: No  . Sexual Activity: Not on file   Other Topics Concern  . Not on file   Social History Narrative   Married, on STD 2/2 recent knee surgery. Did office work.  35PY smoking -- quit on 2005.    Allergies  Allergen Reactions  . Keflex [Cephalexin] Rash     Outpatient Prescriptions Prior to Visit  Medication Sig Dispense Refill  . anastrozole (ARIMIDEX) 1 MG tablet Take 1 mg by mouth daily. Pt to start today or tomorrow    . calcium carbonate (TUMS EX) 750 MG chewable tablet Chew 1 tablet by mouth daily as needed for heartburn.    . cetaphil (CETAPHIL) cream Apply topically as needed.    . clomiPHENE (CLOMID) 50 MG tablet Take 50 mg by mouth at bedtime.     Marland Kitchen desonide (DESOWEN) 0.05 % cream Apply 1 application topically 2 (two) times daily as needed (rash).    . folic acid (FOLVITE) A999333 MCG tablet Take 400 mcg by mouth daily.    . hydrocortisone 2.5 % lotion Apply 1 application topically daily as needed (rash/itching).     . hydroxyurea (HYDREA) 500 MG capsule Take 500-1,000 mg by mouth at bedtime. Take 1 capsule (500 mg) daily except take 2 capsules (1000 mg) on Fridays May take with food to minimize GI side effects.    Marland Kitchen ketoconazole (NIZORAL) 2 % cream Apply 1 application topically daily.    Marland Kitchen lisinopril (PRINIVIL,ZESTRIL) 10 MG tablet Take 10 mg by mouth daily.    Marland Kitchen OVER THE COUNTER MEDICATION Vitafusion men's complete vitamin gummies; pt chews two gummies daily.    . tamsulosin (FLOMAX) 0.4 MG CAPS capsule Take 0.4 mg by mouth at bedtime.     . triamcinolone cream (KENALOG) 0.1 % Apply 1 application topically 2 (two) times daily as needed (skin irritation).     Marland Kitchen aspirin EC 325 MG tablet Take 1 tablet (325 mg total) by mouth 2 (two) times daily. 30 tablet 0  . ketoconazole (NIZORAL) 2 % cream Apply 1 application topically daily as needed for irritation.    . methocarbamol (ROBAXIN) 500 MG tablet Take 1 tablet (500 mg total) by mouth 2 (two) times daily with a meal. 60 tablet 0  . oxyCODONE-acetaminophen (ROXICET) 5-325 MG tablet Take 1 tablet by mouth every 4 (four) hours as needed. 60 tablet 0  . sildenafil (VIAGRA) 100 MG tablet Take 0.5-1 tablets (50-100 mg total) by  mouth daily as needed for erectile dysfunction. 5 tablet 11   No facility-administered medications prior to visit.   Meds ordered this encounter  Medications  . aspirin EC 81 MG tablet    Sig: Take 81 mg by mouth 2 (two) times daily.  Marland Kitchen doxazosin (CARDURA) 1 MG tablet    Sig: Take 1 mg by mouth daily.          Objective:   Physical Exam   Vitals:  Filed Vitals:   08/30/15 0942  BP: 112/62  Pulse: 77  Height: 5\' 11"  (1.803 m)  Weight: 177 lb (80.287 kg)  SpO2: 98%    Constitutional/General:  Pleasant, well-nourished, well-developed, not in any distress,  Comfortably seating.  Well kempt  Body mass index is 24.7 kg/(m^2). Wt Readings from Last 3 Encounters:  08/30/15 177 lb (80.287 kg)  07/17/15 180 lb 3 oz (81.733 kg)  07/06/15 180 lb 3 oz (81.733 kg)      HEENT: Pupils equal and reactive to light and accommodation. Anicteric sclerae. Normal nasal mucosa.   No oral  lesions,  mouth clear,  oropharynx clear, no postnasal drip. (-) Oral thrush. No dental caries. S/P UPP  Airway - Mallampati class III  Neck: No masses. Midline trachea. No JVD, (-) LAD. (-) bruits appreciated.  Respiratory/Chest: Grossly normal chest. (-) deformity. (-) Accessory muscle use.  Symmetric expansion. (-) Tenderness on palpation.  Resonant on percussion.  Diminished BS on both lower lung zones. (-) wheezing, crackles, rhonchi (-) egophony  Cardiovascular: Regular rate and  rhythm, heart sounds normal, no murmur or gallops, no peripheral edema  Gastrointestinal:  Normal bowel sounds. Soft, non-tender. No hepatosplenomegaly.  (-) masses.   Musculoskeletal:  Normal muscle tone. Normal gait.   Extremities: Grossly normal. (-) clubbing, cyanosis.  (-) edema  Skin: (-) rash,lesions seen.   Neurological/Psychiatric : alert, oriented to time, place, person. Normal mood and affect          Assessment & Plan:  OSA (obstructive sleep apnea) Patient was dxed with OSA in the  90s. Lab study. Likely severe. Had laser assisted Uvulopalatoplasty c/o [facility] c/o Dr. Evangeline Gula.  His sx improved.   Through the years, he slowly has been having hypersomnia. Hypersomnia has gotten worse the last yr or so. Has snoring, choking, gasping.  Hypersomnia affects fxnality.  Sleeps 7 hrs/night. (-) abnormal behavior in sleep.  Patient has snoring, hypersomnia, witnessed apneas, choking, gasping during sleep.  ESS is  12.   We discussed about the diagnosis of Obstructive Sleep Apnea (OSA) and implications of untreated OSA. We discussed about CPAP and BiPaP as possible treatment options.   We will schedule the patient for a home sleep study.    Patient was instructed to call the office if he/she has not heard back from the office 1-2 weeks after the sleep study.   Patient was instructed to call the office if he/she is having issues with the PAP device.   We discussed good sleep hygiene.   Patient was advised not to engage in activities requiring concentration and/or vigilance if he/she is sleepy.  Patient was advised not to drive if he/she is sleepy.    Exertional dyspnea 35PY smoking, quin in 2005. Has SOB but not too bad. CXR (2017)  Hyperinflated lungs. Will observe.     Thank you very much for letting me participate in this patient's care. Please do not hesitate to give me a call if you have any questions or concerns regarding the treatment plan.   Patient will follow up with me in 2-3 mos.     Monica Becton, MD 08/30/2015   10:17 AM Pulmonary and [place] Pager: [phone] Office: [phone], Fax: [phone]

## 2015-08-30 NOTE — Assessment & Plan Note (Signed)
35PY smoking, quin in 2005. Has SOB but not too bad. CXR (2017)  Hyperinflated lungs. Will observe.

## 2015-08-30 NOTE — Patient Instructions (Signed)
It was a pleasure taking care of you today!  We will schedule you to have a sleep study to determine if you have sleep apnea.   We will get a home sleep test.  You will be instructed to come back to the office to get an apparatus to sleep with overnight.  Once we have the apparatus, it will usually take [place] 1-2 weeks to read the study and get back at you with results of the test.  Please give [place] a call in 2 weeks after your study if you do not hear back from [place].    If the sleep study is positive, we will order you a CPAP  machine.  Please call the office if you do NOT receive your machine in the next 1-2 weeks.   Please make sure you use your CPAP device everytime you sleep.  We will monitor the usage of your machine per your insurance requirement.  Your insurance company may take the machine from you if you are not using it regularly.   Please clean the mask, tubings, filter, water reservoir with soapy water every week.  Please use distilled water for the water reservoir.   Please call the office or your machine provider (DME company) if you are having issues with the device.   Return to clinic in 2-3 months.

## 2015-08-30 NOTE — Assessment & Plan Note (Signed)
Patient was dxed with OSA in the 90s. Lab study. Likely severe. Had laser assisted Uvulopalatoplasty c/o [facility] ENT c/o Dr. Evangeline Gula.  His sx improved.   Through the years, he slowly has been having hypersomnia. Hypersomnia has gotten worse the last yr or so. Has snoring, choking, gasping.  Hypersomnia affects fxnality.  Sleeps 7 hrs/night. (-) abnormal behavior in sleep.  Patient has snoring, hypersomnia, witnessed apneas, choking, gasping during sleep.  ESS is  12.   We discussed about the diagnosis of Obstructive Sleep Apnea (OSA) and implications of untreated OSA. We discussed about CPAP and BiPaP as possible treatment options.   We will schedule the patient for a home sleep study.    Patient was instructed to call the office if he/she has not heard back from the office 1-2 weeks after the sleep study.   Patient was instructed to call the office if he/she is having issues with the PAP device.   We discussed good sleep hygiene.   Patient was advised not to engage in activities requiring concentration and/or vigilance if he/she is sleepy.  Patient was advised not to drive if he/she is sleepy.

## 2015-08-31 DIAGNOSIS — Z96652 Presence of left artificial knee joint: Secondary | ICD-10-CM | POA: Diagnosis not present

## 2015-08-31 DIAGNOSIS — M25662 Stiffness of left knee, not elsewhere classified: Secondary | ICD-10-CM | POA: Diagnosis not present

## 2015-08-31 DIAGNOSIS — M25562 Pain in left knee: Secondary | ICD-10-CM | POA: Diagnosis not present

## 2015-09-05 DIAGNOSIS — Z96652 Presence of left artificial knee joint: Secondary | ICD-10-CM | POA: Diagnosis not present

## 2015-09-05 DIAGNOSIS — M25662 Stiffness of left knee, not elsewhere classified: Secondary | ICD-10-CM | POA: Diagnosis not present

## 2015-09-05 DIAGNOSIS — M25562 Pain in left knee: Secondary | ICD-10-CM | POA: Diagnosis not present

## 2015-09-06 DIAGNOSIS — G4733 Obstructive sleep apnea (adult) (pediatric): Secondary | ICD-10-CM | POA: Diagnosis not present

## 2015-09-07 ENCOUNTER — Telehealth: Payer: Self-pay | Admitting: Pulmonary Disease

## 2015-09-07 DIAGNOSIS — M25562 Pain in left knee: Secondary | ICD-10-CM | POA: Diagnosis not present

## 2015-09-07 DIAGNOSIS — G4733 Obstructive sleep apnea (adult) (pediatric): Secondary | ICD-10-CM

## 2015-09-07 DIAGNOSIS — M25662 Stiffness of left knee, not elsewhere classified: Secondary | ICD-10-CM | POA: Diagnosis not present

## 2015-09-07 DIAGNOSIS — Z96652 Presence of left artificial knee joint: Secondary | ICD-10-CM | POA: Diagnosis not present

## 2015-09-07 NOTE — Telephone Encounter (Signed)
   Please call the pt and tell the pt the [place]  showed OSA.   Pt stops breathing 27   times an hour.   Please order autoCPAP 5-15 cm H2O. Patient will need a mask fitting session. Patient will need a 1 month download.   Patient needs to be seen by me or any of the NPs/APPs  4-6 weeks after obtaining the cpap machine. Let me know if you receive this.   Thanks!   J. Shirl Harris, MD 09/07/2015, 1:10 PM

## 2015-09-08 ENCOUNTER — Other Ambulatory Visit: Payer: Self-pay | Admitting: *Deleted

## 2015-09-08 DIAGNOSIS — G4710 Hypersomnia, unspecified: Secondary | ICD-10-CM

## 2015-09-11 ENCOUNTER — Other Ambulatory Visit: Payer: Self-pay | Admitting: Family Medicine

## 2015-09-11 NOTE — Telephone Encounter (Signed)
Spoke with pt and gave results and recommendations. Pt agrees to start CPAP therapy. Order placed. Pt aware to call office and schedule f/u appt once starts CPAP. Nothing further needed.  

## 2015-09-12 DIAGNOSIS — M25662 Stiffness of left knee, not elsewhere classified: Secondary | ICD-10-CM | POA: Diagnosis not present

## 2015-09-12 DIAGNOSIS — Z96652 Presence of left artificial knee joint: Secondary | ICD-10-CM | POA: Diagnosis not present

## 2015-09-12 DIAGNOSIS — Z09 Encounter for follow-up examination after completed treatment for conditions other than malignant neoplasm: Secondary | ICD-10-CM | POA: Diagnosis not present

## 2015-09-12 DIAGNOSIS — Z96651 Presence of right artificial knee joint: Secondary | ICD-10-CM | POA: Diagnosis not present

## 2015-09-12 DIAGNOSIS — M25562 Pain in left knee: Secondary | ICD-10-CM | POA: Diagnosis not present

## 2015-09-21 DIAGNOSIS — G4733 Obstructive sleep apnea (adult) (pediatric): Secondary | ICD-10-CM | POA: Diagnosis not present

## 2015-10-03 ENCOUNTER — Other Ambulatory Visit: Payer: Self-pay | Admitting: Family Medicine

## 2015-10-03 DIAGNOSIS — Z0000 Encounter for general adult medical examination without abnormal findings: Secondary | ICD-10-CM

## 2015-10-03 DIAGNOSIS — D473 Essential (hemorrhagic) thrombocythemia: Secondary | ICD-10-CM

## 2015-10-03 DIAGNOSIS — R7989 Other specified abnormal findings of blood chemistry: Secondary | ICD-10-CM

## 2015-10-04 ENCOUNTER — Other Ambulatory Visit (INDEPENDENT_AMBULATORY_CARE_PROVIDER_SITE_OTHER): Payer: BLUE CROSS/BLUE SHIELD

## 2015-10-04 ENCOUNTER — Other Ambulatory Visit: Payer: BLUE CROSS/BLUE SHIELD

## 2015-10-04 DIAGNOSIS — Z1159 Encounter for screening for other viral diseases: Secondary | ICD-10-CM

## 2015-10-04 DIAGNOSIS — E291 Testicular hypofunction: Secondary | ICD-10-CM | POA: Diagnosis not present

## 2015-10-04 DIAGNOSIS — Z0000 Encounter for general adult medical examination without abnormal findings: Secondary | ICD-10-CM | POA: Diagnosis not present

## 2015-10-04 DIAGNOSIS — D473 Essential (hemorrhagic) thrombocythemia: Secondary | ICD-10-CM

## 2015-10-04 DIAGNOSIS — R7989 Other specified abnormal findings of blood chemistry: Secondary | ICD-10-CM

## 2015-10-04 LAB — LIPID PANEL
Cholesterol: 122 mg/dL (ref 0–200)
HDL: 44.4 mg/dL (ref >39.00)
LDL CALC: 66 mg/dL (ref 0–99)
NONHDL: 78.03
Total CHOL/HDL Ratio: 3
Triglycerides: 59 mg/dL (ref 0.0–149.0)
VLDL: 11.8 mg/dL (ref 0.0–40.0)

## 2015-10-04 LAB — CBC WITH DIFFERENTIAL/PLATELET
BASOS ABS: 0.1 10*3/uL (ref 0.0–0.1)
Basophils Relative: 0.9 % (ref 0.0–3.0)
Eosinophils Absolute: 0.1 10*3/uL (ref 0.0–0.7)
Eosinophils Relative: 2 % (ref 0.0–5.0)
HCT: 43.7 % (ref 39.0–52.0)
Hemoglobin: 14.5 g/dL (ref 13.0–17.0)
LYMPHS ABS: 0.9 10*3/uL (ref 0.7–4.0)
Lymphocytes Relative: 14.7 % (ref 12.0–46.0)
MCHC: 33.1 g/dL (ref 30.0–36.0)
MCV: 99 fl (ref 78.0–100.0)
MONOS PCT: 6.5 % (ref 3.0–12.0)
Monocytes Absolute: 0.4 10*3/uL (ref 0.1–1.0)
NEUTROS ABS: 4.7 10*3/uL (ref 1.4–7.7)
NEUTROS PCT: 75.9 % (ref 43.0–77.0)
PLATELETS: 366 10*3/uL (ref 150.0–400.0)
RBC: 4.42 Mil/uL (ref 4.22–5.81)
RDW: 16.4 % — AB (ref 11.5–15.5)
WBC: 6.2 10*3/uL (ref 4.0–10.5)

## 2015-10-04 LAB — COMPREHENSIVE METABOLIC PANEL
ALT: 13 U/L (ref 0–53)
AST: 15 U/L (ref 0–37)
Albumin: 4.2 g/dL (ref 3.5–5.2)
Alkaline Phosphatase: 57 U/L (ref 39–117)
BILIRUBIN TOTAL: 0.6 mg/dL (ref 0.2–1.2)
BUN: 20 mg/dL (ref 6–23)
CHLORIDE: 103 meq/L (ref 96–112)
CO2: 31 meq/L (ref 19–32)
Calcium: 9.6 mg/dL (ref 8.4–10.5)
Creatinine, Ser: 1.03 mg/dL (ref 0.40–1.50)
GFR: 78.77 mL/min (ref >60.00)
GLUCOSE: 132 mg/dL — AB (ref 70–99)
POTASSIUM: 5.2 meq/L — AB (ref 3.5–5.1)
Sodium: 139 mEq/L (ref 135–145)
Total Protein: 6 g/dL (ref 6.0–8.3)

## 2015-10-04 LAB — PSA: PSA: 0.56 ng/mL (ref 0.10–4.00)

## 2015-10-04 LAB — TESTOSTERONE: Testosterone: 424.82 ng/dL (ref 300.00–890.00)

## 2015-10-05 LAB — LACTATE DEHYDROGENASE: LDH: 164 U/L (ref 94–250)

## 2015-10-05 LAB — HEPATITIS C ANTIBODY: HCV AB: NEGATIVE

## 2015-10-06 ENCOUNTER — Other Ambulatory Visit: Payer: Self-pay | Admitting: Family Medicine

## 2015-10-09 ENCOUNTER — Other Ambulatory Visit: Payer: Self-pay | Admitting: *Deleted

## 2015-10-09 DIAGNOSIS — D473 Essential (hemorrhagic) thrombocythemia: Secondary | ICD-10-CM

## 2015-10-09 MED ORDER — HYDROXYUREA 500 MG PO CAPS: ORAL_CAPSULE | ORAL | Status: DC

## 2015-10-09 NOTE — Telephone Encounter (Signed)
Pt requesting medication refill. Last f/u 04/2015-CPE

## 2015-10-21 DIAGNOSIS — G4733 Obstructive sleep apnea (adult) (pediatric): Secondary | ICD-10-CM | POA: Diagnosis not present

## 2015-11-20 ENCOUNTER — Encounter: Payer: Self-pay | Admitting: Pulmonary Disease

## 2015-11-20 ENCOUNTER — Ambulatory Visit (INDEPENDENT_AMBULATORY_CARE_PROVIDER_SITE_OTHER): Payer: BLUE CROSS/BLUE SHIELD | Admitting: Pulmonary Disease

## 2015-11-20 DIAGNOSIS — G4733 Obstructive sleep apnea (adult) (pediatric): Secondary | ICD-10-CM

## 2015-11-20 NOTE — Patient Instructions (Signed)
  It was a pleasure taking care of you today!  Continue using your CPAP machine.   Please make sure you use your CPAP device everytime you sleep.  We will monitor the usage of your machine per your insurance requirement.  Your insurance company may take the machine from you if you are not using it regularly.   Please clean the mask, tubings, filter, water reservoir with soapy water every week.  Please use distilled water for the water reservoir.   Please call the office or your machine provider (DME company) if you are having issues with the device.   Return to clinic in 1 year    

## 2015-11-20 NOTE — Assessment & Plan Note (Signed)
Patient was dxed with OSA in the 90s. Lab study. Likely severe. Had laser assisted Uvulopalatoplasty c/o [facility]NT c/o Dr. Evangeline Gula.  His sx improved.   Through the years, he slowly has been having hypersomnia. Hypersomnia has gotten worse the last yr or so. Has snoring, choking, gasping.  Hypersomnia affects fxnality.  Sleeps 7 hrs/night. (-) abnormal behavior in sleep.  Patient had a home sleep study in June 2017. AHI was 27. Marland Kitchen He was started on auto CPAP in June. Feels better using it. More energy. Less sleepiness. Download the last 2 months, 95% AHI of 5.   Some leak issues.  We extensively discussed the importance of treating OSA and the need to use PAP therapy.   Continue with autocpap 5-15. Download the last 2 months 95% compliance, AHI 5. Patient wants to get a download in 6 months. We will get that and send him a copy. May also try Resmed Air touch F20 mask if with leak issues.   Patient was instructed to have mask, tubings, filter, reservoir cleaned at least once a week with soapy water.  Patient was instructed to call the office if he/she is having issues with the PAP device.    I advised patient to obtain sufficient amount of sleep --  7 to 8 hours at least in a 24 hr period.  Patient was advised to follow good sleep hygiene.  Patient was advised NOT to engage in activities requiring concentration and/or vigilance if he/she is and  sleepy.  Patient is NOT to drive if he/she is sleepy.

## 2015-11-20 NOTE — Progress Notes (Signed)
Subjective:    Patient ID: Dennis Martin, male    DOB: 16-Jul-1955, 60 y.o.   MRN: [id number]  HPI   This is the case of Dennis Martin, 60 y.o. Male, who was referred by Dr. Arnette Norris  in consultation regarding OSA.    As you very well know, patient was dxed with OSA in the 90s. Lab study. Likely severe. Had laser assisted Uvulopalatoplasty c/o [facility]NT c/o Dr. Evangeline Gula.  His sx improved.   Through the years, he slowly has been having hypersomnia. Hypersomnia has gotten worse the last yr or so. Has snoring, choking, gasping.  Hypersomnia affects fxnality.  Sleeps 7 hrs/night. (-) abnormal behavior in sleep.   Significant smoker but (-) symptomatic exertional SOB. Quit in 2005.   ROV 11/20/15  Patient is here for follow-up on his sleep apnea. Since last seen, he ended up having a home sleep test which showed AHI of 27. He was started on auto CPAP in June. Feels better using it. More energy. Less sleepiness. Download the last 2 months, 95% AHI of 5. Some leak issues. Has not been admitted nor been on antibiotics since last seen.  Review of Systems  Constitutional: Negative.  Negative for fever and unexpected weight change.  HENT: Positive for rhinorrhea. Negative for congestion, dental problem, ear pain, nosebleeds, postnasal drip, sinus pressure, sneezing, sore throat and trouble swallowing.   Eyes: Negative.  Negative for redness and itching.  Respiratory: Negative.  Negative for cough, chest tightness, shortness of breath and wheezing.   Cardiovascular: Negative.  Negative for palpitations and leg swelling.  Gastrointestinal: Negative.  Negative for nausea and vomiting.  Endocrine: Negative.   Genitourinary: Negative.  Negative for dysuria.  Musculoskeletal: Positive for arthralgias and joint swelling.  Skin: Negative.  Negative for rash.  Allergic/Immunologic: Negative.   Neurological: Negative.  Negative for headaches.  Hematological: Negative.  Does not bruise/bleed easily.    Psychiatric/Behavioral: Negative.  Negative for dysphoric mood. The patient is not nervous/anxious.       Objective:   Physical Exam   Vitals:  Vitals:   11/20/15 1611  BP: 118/72  Pulse: 70  SpO2: 95%  Weight: 189 lb 6.4 oz (85.9 kg)  Height: 5\' 11"  (1.803 m)    Constitutional/General:  Pleasant, well-nourished, well-developed, not in any distress,  Comfortably seating.  Well kempt  Body mass index is 26.42 kg/m. Wt Readings from Last 3 Encounters:  11/20/15 189 lb 6.4 oz (85.9 kg)  08/30/15 177 lb (80.3 kg)  07/17/15 180 lb 3 oz (81.7 kg)      HEENT: Pupils equal and reactive to light and accommodation. Anicteric sclerae. Normal nasal mucosa.   No oral  lesions,  mouth clear,  oropharynx clear, no postnasal drip. (-) Oral thrush. No dental caries. S/P UPP  Airway - Mallampati class III  Neck: No masses. Midline trachea. No JVD, (-) LAD. (-) bruits appreciated.  Respiratory/Chest: Grossly normal chest. (-) deformity. (-) Accessory muscle use.  Symmetric expansion. (-) Tenderness on palpation.  Resonant on percussion.  Diminished BS on both lower lung zones. (-) wheezing, crackles, rhonchi (-) egophony  Cardiovascular: Regular rate and  rhythm, heart sounds normal, no murmur or gallops, no peripheral edema  Gastrointestinal:  Normal bowel sounds. Soft, non-tender. No hepatosplenomegaly.  (-) masses.   Musculoskeletal:  Normal muscle tone. Normal gait.   Extremities: Grossly normal. (-) clubbing, cyanosis.  (-) edema  Skin: (-) rash,lesions seen.   Neurological/Psychiatric : alert, oriented to time, place, person. Normal  mood and affect          Assessment & Plan:  OSA (obstructive sleep apnea) Patient was dxed with OSA in the 90s. Lab study. Likely severe. Had laser assisted Uvulopalatoplasty c/o [facility] ENT c/o Dr. Evangeline Gula.  His sx improved.   Through the years, he slowly has been having hypersomnia. Hypersomnia has gotten worse the last yr or  so. Has snoring, choking, gasping.  Hypersomnia affects fxnality.  Sleeps 7 hrs/night. (-) abnormal behavior in sleep.  Patient had a home sleep study in June 2017. AHI was 27. Marland Kitchen He was started on auto CPAP in June. Feels better using it. More energy. Less sleepiness. Download the last 2 months, 95% AHI of 5.   Some leak issues.  We extensively discussed the importance of treating OSA and the need to use PAP therapy.   Continue with autocpap 5-15. Download the last 2 months 95% compliance, AHI 5. Patient wants to get a download in 6 months. We will get that and send him a copy. May also try Resmed Air touch F20 mask if with leak issues.   Patient was instructed to have mask, tubings, filter, reservoir cleaned at least once a week with soapy water.  Patient was instructed to call the office if he/she is having issues with the PAP device.    I advised patient to obtain sufficient amount of sleep --  7 to 8 hours at least in a 24 hr period.  Patient was advised to follow good sleep hygiene.  Patient was advised NOT to engage in activities requiring concentration and/or vigilance if he/she is and  sleepy.  Patient is NOT to drive if he/she is sleepy.         Patient will follow up with me in 1 year.     Monica Becton, MD 11/20/2015   4:53 PM Pulmonary and [place] Pager: [phone] Office: [phone], Fax: [phone]

## 2015-11-21 DIAGNOSIS — G4733 Obstructive sleep apnea (adult) (pediatric): Secondary | ICD-10-CM | POA: Diagnosis not present

## 2015-12-06 ENCOUNTER — Ambulatory Visit (INDEPENDENT_AMBULATORY_CARE_PROVIDER_SITE_OTHER): Payer: BLUE CROSS/BLUE SHIELD

## 2015-12-06 DIAGNOSIS — Z23 Encounter for immunization: Secondary | ICD-10-CM

## 2015-12-15 ENCOUNTER — Encounter: Payer: Self-pay | Admitting: Pulmonary Disease

## 2015-12-18 DIAGNOSIS — G4733 Obstructive sleep apnea (adult) (pediatric): Secondary | ICD-10-CM | POA: Diagnosis not present

## 2015-12-22 DIAGNOSIS — G4733 Obstructive sleep apnea (adult) (pediatric): Secondary | ICD-10-CM | POA: Diagnosis not present

## 2016-01-02 ENCOUNTER — Encounter: Payer: Self-pay | Admitting: Pulmonary Disease

## 2016-01-16 DIAGNOSIS — M25561 Pain in right knee: Secondary | ICD-10-CM | POA: Diagnosis not present

## 2016-01-16 DIAGNOSIS — M25562 Pain in left knee: Secondary | ICD-10-CM | POA: Diagnosis not present

## 2016-02-19 DIAGNOSIS — E291 Testicular hypofunction: Secondary | ICD-10-CM | POA: Diagnosis not present

## 2016-02-26 DIAGNOSIS — R350 Frequency of micturition: Secondary | ICD-10-CM | POA: Diagnosis not present

## 2016-02-26 DIAGNOSIS — N401 Enlarged prostate with lower urinary tract symptoms: Secondary | ICD-10-CM | POA: Diagnosis not present

## 2016-02-26 DIAGNOSIS — E290 Testicular hyperfunction: Secondary | ICD-10-CM | POA: Diagnosis not present

## 2016-02-26 DIAGNOSIS — E291 Testicular hypofunction: Secondary | ICD-10-CM | POA: Diagnosis not present

## 2016-03-21 DIAGNOSIS — G4733 Obstructive sleep apnea (adult) (pediatric): Secondary | ICD-10-CM | POA: Diagnosis not present

## 2016-04-23 ENCOUNTER — Other Ambulatory Visit: Payer: Self-pay | Admitting: Family Medicine

## 2016-04-23 DIAGNOSIS — Z0000 Encounter for general adult medical examination without abnormal findings: Secondary | ICD-10-CM

## 2016-04-23 DIAGNOSIS — D473 Essential (hemorrhagic) thrombocythemia: Secondary | ICD-10-CM

## 2016-04-23 DIAGNOSIS — N401 Enlarged prostate with lower urinary tract symptoms: Secondary | ICD-10-CM

## 2016-05-04 ENCOUNTER — Other Ambulatory Visit: Payer: Self-pay | Admitting: Family Medicine

## 2016-05-06 ENCOUNTER — Other Ambulatory Visit (INDEPENDENT_AMBULATORY_CARE_PROVIDER_SITE_OTHER): Payer: BLUE CROSS/BLUE SHIELD

## 2016-05-06 DIAGNOSIS — D473 Essential (hemorrhagic) thrombocythemia: Secondary | ICD-10-CM

## 2016-05-06 DIAGNOSIS — N401 Enlarged prostate with lower urinary tract symptoms: Secondary | ICD-10-CM | POA: Diagnosis not present

## 2016-05-06 DIAGNOSIS — Z0000 Encounter for general adult medical examination without abnormal findings: Secondary | ICD-10-CM

## 2016-05-06 LAB — CBC WITH DIFFERENTIAL/PLATELET
BASOS ABS: 0.1 10*3/uL (ref 0.0–0.1)
Basophils Relative: 1.2 % (ref 0.0–3.0)
Eosinophils Absolute: 0.1 10*3/uL (ref 0.0–0.7)
Eosinophils Relative: 1 % (ref 0.0–5.0)
HCT: 44.7 % (ref 39.0–52.0)
Hemoglobin: 15.4 g/dL (ref 13.0–17.0)
LYMPHS ABS: 0.7 10*3/uL (ref 0.7–4.0)
Lymphocytes Relative: 10.9 % — ABNORMAL LOW (ref 12.0–46.0)
MCHC: 34.4 g/dL (ref 30.0–36.0)
MCV: 106.7 fl — ABNORMAL HIGH (ref 78.0–100.0)
MONO ABS: 0.5 10*3/uL (ref 0.1–1.0)
Monocytes Relative: 7.3 % (ref 3.0–12.0)
NEUTROS PCT: 79.6 % — AB (ref 43.0–77.0)
Neutro Abs: 5.1 10*3/uL (ref 1.4–7.7)
Platelets: 349 10*3/uL (ref 150.0–400.0)
RBC: 4.19 Mil/uL — AB (ref 4.22–5.81)
RDW: 13.9 % (ref 11.5–15.5)
WBC: 6.4 10*3/uL (ref 4.0–10.5)

## 2016-05-06 LAB — COMPREHENSIVE METABOLIC PANEL
ALT: 15 U/L (ref 0–53)
AST: 20 U/L (ref 0–37)
Albumin: 4.4 g/dL (ref 3.5–5.2)
Alkaline Phosphatase: 57 U/L (ref 39–117)
BUN: 21 mg/dL (ref 6–23)
CHLORIDE: 103 meq/L (ref 96–112)
CO2: 30 mEq/L (ref 19–32)
Calcium: 9.4 mg/dL (ref 8.4–10.5)
Creatinine, Ser: 1.08 mg/dL (ref 0.40–1.50)
GFR: 74.43 mL/min (ref >60.00)
GLUCOSE: 98 mg/dL (ref 70–99)
POTASSIUM: 5 meq/L (ref 3.5–5.1)
SODIUM: 138 meq/L (ref 135–145)
TOTAL PROTEIN: 6.1 g/dL (ref 6.0–8.3)
Total Bilirubin: 0.7 mg/dL (ref 0.2–1.2)

## 2016-05-06 LAB — LIPID PANEL
CHOL/HDL RATIO: 3
Cholesterol: 140 mg/dL (ref 0–200)
HDL: 48 mg/dL (ref >39.00)
LDL CALC: 74 mg/dL (ref 0–99)
NONHDL: 92.27
Triglycerides: 90 mg/dL (ref 0.0–149.0)
VLDL: 18 mg/dL (ref 0.0–40.0)

## 2016-05-06 LAB — PSA: PSA: 0.53 ng/mL (ref 0.10–4.00)

## 2016-05-06 NOTE — Addendum Note (Signed)
Addended by: Ellamae Sia on: 05/06/2016 08:41 AM   Modules accepted: Orders

## 2016-05-07 DIAGNOSIS — Z96651 Presence of right artificial knee joint: Secondary | ICD-10-CM | POA: Diagnosis not present

## 2016-05-07 DIAGNOSIS — M25562 Pain in left knee: Secondary | ICD-10-CM | POA: Diagnosis not present

## 2016-05-07 DIAGNOSIS — Z96652 Presence of left artificial knee joint: Secondary | ICD-10-CM | POA: Diagnosis not present

## 2016-05-07 DIAGNOSIS — M25561 Pain in right knee: Secondary | ICD-10-CM | POA: Diagnosis not present

## 2016-05-07 LAB — LACTATE DEHYDROGENASE: LDH: 173 U/L (ref 120–250)

## 2016-05-13 ENCOUNTER — Telehealth: Payer: Self-pay | Admitting: Family Medicine

## 2016-05-13 ENCOUNTER — Ambulatory Visit (INDEPENDENT_AMBULATORY_CARE_PROVIDER_SITE_OTHER): Payer: BLUE CROSS/BLUE SHIELD | Admitting: Family Medicine

## 2016-05-13 ENCOUNTER — Encounter: Payer: Self-pay | Admitting: Family Medicine

## 2016-05-13 VITALS — BP 118/72 | HR 78 | Temp 97.9°F | Ht 70.75 in | Wt 190.0 lb

## 2016-05-13 DIAGNOSIS — E349 Endocrine disorder, unspecified: Secondary | ICD-10-CM

## 2016-05-13 DIAGNOSIS — G4733 Obstructive sleep apnea (adult) (pediatric): Secondary | ICD-10-CM

## 2016-05-13 DIAGNOSIS — B372 Candidiasis of skin and nail: Secondary | ICD-10-CM | POA: Insufficient documentation

## 2016-05-13 DIAGNOSIS — I10 Essential (primary) hypertension: Secondary | ICD-10-CM | POA: Diagnosis not present

## 2016-05-13 DIAGNOSIS — D473 Essential (hemorrhagic) thrombocythemia: Secondary | ICD-10-CM | POA: Diagnosis not present

## 2016-05-13 DIAGNOSIS — Z0000 Encounter for general adult medical examination without abnormal findings: Secondary | ICD-10-CM | POA: Diagnosis not present

## 2016-05-13 DIAGNOSIS — R7989 Other specified abnormal findings of blood chemistry: Secondary | ICD-10-CM

## 2016-05-13 DIAGNOSIS — N401 Enlarged prostate with lower urinary tract symptoms: Secondary | ICD-10-CM

## 2016-05-13 MED ORDER — LISINOPRIL 10 MG PO TABS
10.0000 mg | ORAL_TABLET | Freq: Every day | ORAL | 2 refills | Status: DC
Start: 2016-05-13 — End: 2016-05-14

## 2016-05-13 MED ORDER — NYSTATIN 100000 UNIT/GM EX POWD: Freq: Four times a day (QID) | CUTANEOUS | 0 refills | Status: DC

## 2016-05-13 NOTE — Assessment & Plan Note (Signed)
Reviewed preventive care protocols, scheduled due services, and updated immunizations Discussed nutrition, exercise, diet, and healthy lifestyle.  

## 2016-05-13 NOTE — Progress Notes (Addendum)
Subjective:   Patient ID: Dennis Martin, male    DOB: 1955/07/12, 61 y.o.   MRN: [id number]  Carols Kintigh is a pleasant 61 y.o. year old male who presents to clinic today with Annual Exam  and follow up of chronic medical conditions on 05/13/2016  HPI:  Colonoscopy 05/25/12 ([facility])- recall in 10 years.  tdap 04/07/13 Zoster 02/09/13  Rash on bottom- has tried triamcinolone without much relief in symptoms.  Ongoing for months. OSA on CPAP.  BPH/Low t- followed by Dr. Gaynelle Arabian.  Last seen on 08/22/15.  Note reviewed. He is compliant with Flomax. Was on clomid which was d/'c'd last year because testosterone was still. Was just started on topical testosterone but has not yet received this from mail order. Lab Results  Component Value Date   PSA 0.53 05/06/2016   PSA 0.56 10/04/2015   PSA 0.67 05/01/2015   HTN- well controlled on lisinopril 10 mg daily. Denies HA, blurred vision, CP or SOB.  OSA- saw Dr. Gerilyn Pilgrim de Dios on 11/20/15- note reviewed.  Essential Thrombocytosis- diagnosed in 2006. Takes hydroxyurea, folate. CBC has been stable.  Lab Results  Component Value Date   WBC 6.4 05/06/2016   HGB 15.4 05/06/2016   HCT 44.7 05/06/2016   MCV 106.7 (H) 05/06/2016   PLT 349.0 05/06/2016   Lab Results  Component Value Date   CHOL 140 05/06/2016   HDL 48.00 05/06/2016   LDLCALC 74 05/06/2016   TRIG 90.0 05/06/2016   CHOLHDL 3 05/06/2016   Lab Results  Component Value Date   ALT 15 05/06/2016   AST 20 05/06/2016   ALKPHOS 57 05/06/2016   BILITOT 0.7 05/06/2016   Lab Results  Component Value Date   NA 138 05/06/2016   K 5.0 05/06/2016   CL 103 05/06/2016   CO2 30 05/06/2016   Lab Results  Component Value Date   CREATININE 1.08 05/06/2016       Current Outpatient Prescriptions on File Prior to Visit  Medication Sig Dispense Refill  . aspirin EC 81 MG tablet Take 81 mg by mouth 2 (two) times daily.    . calcium carbonate (TUMS EX) 750 MG chewable  tablet Chew 1 tablet by mouth daily as needed for heartburn.    . cetaphil (CETAPHIL) cream Apply topically as needed.    . desonide (DESOWEN) 0.05 % cream Apply 1 application topically 2 (two) times daily as needed (rash).    . folic acid (FOLVITE) A999333 MCG tablet Take 400 mcg by mouth daily.    . hydrocortisone 2.5 % lotion Apply 1 application topically daily as needed (rash/itching).     . hydroxyurea (HYDREA) 500 MG capsule TAKE 1 CAPSULE DAILY EXCEPT TAKE 2 CAPSULES ON FRIDAYS 60 capsule 0  . ketoconazole (NIZORAL) 2 % cream Apply 1 application topically daily.    Marland Kitchen OVER THE COUNTER MEDICATION Vitafusion men's complete vitamin gummies; pt chews two gummies daily.    . tamsulosin (FLOMAX) 0.4 MG CAPS capsule Take 0.4 mg by mouth at bedtime.     . triamcinolone cream (KENALOG) 0.1 % Apply 1 application topically 2 (two) times daily as needed (skin irritation).      No current facility-administered medications on file prior to visit.     Allergies  Allergen Reactions  . Keflex [Cephalexin] Rash    Past Medical History:  Diagnosis Date  . Anxiety    hx of - 10 years ago  . Arthritis    both arthritis, wrist, hands   .  BPH (benign prostatic hyperplasia)   . DVT of leg (deep venous thrombosis) ([place]) 2001   post knee arthroscopy,also complicated by infection, PICC line used for long term antibiotics, was on coumadin x's 6 months  . Essential thrombocytosis ([place])   . Headache    relative to testosterone level, it had been a problem, improved with clomid  . History of stress test 2008   done in [place] ,nuclear stress test- told that it was normal  . Hypertension   . Peripheral vascular disease ([place])   . Sleep apnea    did surgery for the problem  . Spleen enlarged   . Thrombocythemia ([place])    monitored by Dr. Earlie Server    Past Surgical History:  Procedure Laterality Date  . EYE SURGERY     chalazion on both eyelids  . KNEE SURGERY Bilateral 2001  . NASAL SINUS SURGERY     . TONSILLECTOMY    . TOTAL KNEE ARTHROPLASTY Right 05/22/2015  . TOTAL KNEE ARTHROPLASTY Right 05/22/2015   Procedure: TOTAL KNEE ARTHROPLASTY;  Surgeon: Frederik Pear, MD;  Location: [facility];  Service: Orthopedics;  Laterality: Right;  . TOTAL KNEE ARTHROPLASTY Left 07/17/2015  . TOTAL KNEE ARTHROPLASTY Left 07/17/2015   Procedure: TOTAL KNEE ARTHROPLASTY;  Surgeon: Frederik Pear, MD;  Location: [facility];  Service: Orthopedics;  Laterality: Left;  . ULNAR NERVE TRANSPOSITION  2007   left    Family History  Problem Relation Age of Onset  . Stomach cancer Neg Hx   . Colon cancer Paternal Aunt 85    Social History   Social History  . Marital status: Married    Spouse name: N/A  . Number of children: N/A  . Years of education: N/A   Occupational History  . Not on file.   Social History Main Topics  . Smoking status: Former Smoker    Quit date: 05/06/2003  . Smokeless tobacco: Never Used     Comment: Quit 2005  . Alcohol use 8.4 oz/week    14 Cans of beer per week     Comment: 2 beers daily  . Drug use: No  . Sexual activity: Not on file   Other Topics Concern  . Not on file   Social History Narrative  . No narrative on file   The PMH, PSH, Social History, Family History, Medications, and allergies have been reviewed in [facility], and have been updated if relevant.   Review of Systems  Constitutional: Negative.   HENT: Negative.   Eyes: Negative.   Respiratory: Negative.   Cardiovascular: Negative.   Gastrointestinal: Negative.   Endocrine: Negative.   Genitourinary: Negative.   Musculoskeletal: Negative.   Skin: Positive for rash.  Allergic/Immunologic: Negative.   Neurological: Negative.   Hematological: Negative.   Psychiatric/Behavioral: Negative.   All other systems reviewed and are negative.      Objective:    BP 118/72   Pulse 78   Temp 97.9 F (36.6 C) (Oral)   Ht 5' 10.75" (1.797 m)   Wt 190 lb (86.2 kg)   SpO2 97%   BMI 26.69 kg/m   Wt Readings  from Last 3 Encounters:  05/13/16 190 lb (86.2 kg)  11/20/15 189 lb 6.4 oz (85.9 kg)  08/30/15 177 lb (80.3 kg)    Physical Exam  Constitutional: He is oriented to person, place, and time. He appears well-developed and well-nourished.  HENT:  Head: Normocephalic and atraumatic.  Eyes: Conjunctivae are normal.  Cardiovascular: Normal rate and regular  rhythm.   Pulmonary/Chest: Effort normal and breath sounds normal.  Musculoskeletal: Normal range of motion. He exhibits no edema.  Neurological: He is alert and oriented to person, place, and time. No cranial nerve deficit.  Skin: Skin is warm and dry. He is not diaphoretic.     Psychiatric: He has a normal mood and affect. His behavior is normal. Judgment and thought content normal.  Nursing note and vitals reviewed.         Assessment & Plan:   Routine general medical examination at a health care facility  Essential thrombocytosis [facility])  Essential hypertension  Benign prostatic hyperplasia with lower urinary tract symptoms, symptom details unspecified  OSA (obstructive sleep apnea) No Follow-up on file.

## 2016-05-13 NOTE — Assessment & Plan Note (Signed)
Followed by hematology. Labs stable.

## 2016-05-13 NOTE — Patient Instructions (Addendum)
Great to see you.  Let me know how the topical testosterone works out.  Try topical nystatin powder on your rash area. Keep me updated.

## 2016-05-13 NOTE — Assessment & Plan Note (Signed)
Discussed using "diaper creams" given same type of rash. Also sent in eRx for nystatin powder. Call or return to clinic prn if these symptoms worsen or fail to improve as anticipated. The patient indicates understanding of these issues and agrees with the plan.

## 2016-05-13 NOTE — Telephone Encounter (Signed)
Pt called about his lisinopril rx.  He said it was called into [facility] on accident.  He said this needs to be called into the mail order pharmacy.

## 2016-05-13 NOTE — Assessment & Plan Note (Signed)
He is anxious to start testosterone. He will keep me updated.

## 2016-05-13 NOTE — Assessment & Plan Note (Signed)
Compliant with CPAP 

## 2016-05-13 NOTE — Assessment & Plan Note (Signed)
Well controlled. No changes made to rxs. 

## 2016-05-13 NOTE — Progress Notes (Signed)
Pre visit review using our clinic review tool, if applicable. No additional management support is needed unless otherwise documented below in the visit note. 

## 2016-05-13 NOTE — Assessment & Plan Note (Signed)
On flomax. Followed by urology. Symptoms controlled, PSA stable.

## 2016-05-13 NOTE — Addendum Note (Signed)
Addended by: Lucille Passy on: 05/13/2016 09:08 AM   Modules accepted: Orders

## 2016-05-14 MED ORDER — LISINOPRIL 10 MG PO TABS: 10.0000 mg | ORAL_TABLET | Freq: Every day | ORAL | 2 refills | Status: DC

## 2016-05-27 ENCOUNTER — Encounter: Payer: Self-pay | Admitting: Pulmonary Disease

## 2016-05-30 ENCOUNTER — Telehealth: Payer: Self-pay | Admitting: Pulmonary Disease

## 2016-05-30 DIAGNOSIS — G4733 Obstructive sleep apnea (adult) (pediatric): Secondary | ICD-10-CM

## 2016-05-30 NOTE — Telephone Encounter (Signed)
   DL the last month 100%,  AHI was 10.  With leak issues. Previous DL showed AHI 5.   Jasmine: 1. pls ask pt if he is having leak issues. If so, can we send to [facility] for mask fitting session? 2. pls get a download in 07/2016.  Thanks.  Monica Becton, MD 05/30/2016, 1:20 PM [facility] Pager [phone] After 3 pm or if no answer, call [phone]

## 2016-05-30 NOTE — Telephone Encounter (Signed)
lmomtcb x1 

## 2016-05-31 NOTE — Telephone Encounter (Signed)
Patient returning phone call 

## 2016-05-31 NOTE — Telephone Encounter (Signed)
Spoke with pt, aware of results/recs.  Ok to proceed with cpap mask fit- this has been ordered.  Pt also requesting cpap download report be mailed to him.  This has been mailed to verified address on file.  Nothing further needed.

## 2016-05-31 NOTE — Telephone Encounter (Signed)
Patient is returning phone call.  °

## 2016-05-31 NOTE — Telephone Encounter (Signed)
Called pt back and the call went straight to his voicemail. lmtcb x1 for pt.

## 2016-06-19 ENCOUNTER — Ambulatory Visit (HOSPITAL_BASED_OUTPATIENT_CLINIC_OR_DEPARTMENT_OTHER): Payer: BLUE CROSS/BLUE SHIELD | Attending: Pulmonary Disease | Admitting: Radiology

## 2016-06-21 ENCOUNTER — Other Ambulatory Visit: Payer: Self-pay | Admitting: Family Medicine

## 2016-06-24 DIAGNOSIS — E291 Testicular hypofunction: Secondary | ICD-10-CM | POA: Diagnosis not present

## 2016-06-25 MED ORDER — NYSTATIN 100000 UNIT/GM EX POWD: CUTANEOUS | 2 refills | Status: DC

## 2016-06-25 NOTE — Addendum Note (Signed)
Addended by: Carter Kitten on: 06/25/2016 11:17 AM   Modules accepted: Orders

## 2016-06-25 NOTE — Telephone Encounter (Signed)
Received fax from [facility] asking for clarification on quantity.  Rx was written for 100000 grams.  Per Dr. Deborra Medina it should be 15 g.  Rx corrected and resent to [facility].

## 2016-07-01 DIAGNOSIS — E291 Testicular hypofunction: Secondary | ICD-10-CM | POA: Diagnosis not present

## 2016-07-01 DIAGNOSIS — N401 Enlarged prostate with lower urinary tract symptoms: Secondary | ICD-10-CM | POA: Diagnosis not present

## 2016-07-01 DIAGNOSIS — R350 Frequency of micturition: Secondary | ICD-10-CM | POA: Diagnosis not present

## 2016-07-02 DIAGNOSIS — G4733 Obstructive sleep apnea (adult) (pediatric): Secondary | ICD-10-CM | POA: Diagnosis not present

## 2016-07-06 ENCOUNTER — Other Ambulatory Visit: Payer: Self-pay | Admitting: Family Medicine

## 2016-07-24 ENCOUNTER — Other Ambulatory Visit: Payer: Self-pay | Admitting: Family Medicine

## 2016-10-09 DIAGNOSIS — G4733 Obstructive sleep apnea (adult) (pediatric): Secondary | ICD-10-CM | POA: Diagnosis not present

## 2016-10-31 ENCOUNTER — Encounter: Payer: Self-pay | Admitting: Family Medicine

## 2016-12-05 ENCOUNTER — Ambulatory Visit: Payer: BLUE CROSS/BLUE SHIELD

## 2016-12-09 ENCOUNTER — Encounter: Payer: Self-pay | Admitting: Family Medicine

## 2016-12-10 ENCOUNTER — Emergency Department (HOSPITAL_COMMUNITY): Payer: BLUE CROSS/BLUE SHIELD

## 2016-12-10 ENCOUNTER — Emergency Department (HOSPITAL_BASED_OUTPATIENT_CLINIC_OR_DEPARTMENT_OTHER)
Admit: 2016-12-10 | Discharge: 2016-12-10 | Disposition: A | Discharge status: Home / Self Care | Payer: BLUE CROSS/BLUE SHIELD | Attending: Emergency Medicine | Admitting: Emergency Medicine

## 2016-12-10 ENCOUNTER — Emergency Department (HOSPITAL_COMMUNITY)
Admission: EM | Admit: 2016-12-10 | Discharge: 2016-12-10 | Disposition: A | Discharge status: Home / Self Care | Payer: BLUE CROSS/BLUE SHIELD | Attending: Emergency Medicine | Admitting: Emergency Medicine

## 2016-12-10 ENCOUNTER — Telehealth: Payer: Self-pay | Admitting: Family Medicine

## 2016-12-10 ENCOUNTER — Encounter (HOSPITAL_COMMUNITY): Payer: Self-pay | Admitting: *Deleted

## 2016-12-10 DIAGNOSIS — Z87891 Personal history of nicotine dependence: Secondary | ICD-10-CM | POA: Diagnosis not present

## 2016-12-10 DIAGNOSIS — M79662 Pain in left lower leg: Secondary | ICD-10-CM | POA: Diagnosis not present

## 2016-12-10 DIAGNOSIS — M79609 Pain in unspecified limb: Secondary | ICD-10-CM

## 2016-12-10 DIAGNOSIS — M7122 Synovial cyst of popliteal space [Baker], left knee: Secondary | ICD-10-CM | POA: Insufficient documentation

## 2016-12-10 DIAGNOSIS — Z86718 Personal history of other venous thrombosis and embolism: Secondary | ICD-10-CM | POA: Diagnosis not present

## 2016-12-10 DIAGNOSIS — M660 Rupture of popliteal cyst: Secondary | ICD-10-CM

## 2016-12-10 DIAGNOSIS — I10 Essential (primary) hypertension: Secondary | ICD-10-CM | POA: Insufficient documentation

## 2016-12-10 DIAGNOSIS — R0602 Shortness of breath: Secondary | ICD-10-CM

## 2016-12-10 DIAGNOSIS — Z7982 Long term (current) use of aspirin: Secondary | ICD-10-CM | POA: Insufficient documentation

## 2016-12-10 DIAGNOSIS — Z79899 Other long term (current) drug therapy: Secondary | ICD-10-CM | POA: Diagnosis not present

## 2016-12-10 DIAGNOSIS — M7989 Other specified soft tissue disorders: Secondary | ICD-10-CM | POA: Diagnosis not present

## 2016-12-10 LAB — I-STAT CHEM 8, ED
BUN: 26 mg/dL — ABNORMAL HIGH (ref 6–20)
CALCIUM ION: 1.08 mmol/L — AB (ref 1.15–1.40)
Chloride: 101 mmol/L (ref 101–111)
Creatinine, Ser: 1.3 mg/dL — ABNORMAL HIGH (ref 0.61–1.24)
Glucose, Bld: 86 mg/dL (ref 65–99)
HEMATOCRIT: 51 % (ref 39.0–52.0)
HEMOGLOBIN: 17.3 g/dL — AB (ref 13.0–17.0)
Potassium: 4.4 mmol/L (ref 3.5–5.1)
SODIUM: 138 mmol/L (ref 135–145)
TCO2: 29 mmol/L (ref 22–32)

## 2016-12-10 LAB — I-STAT TROPONIN, ED: Troponin i, poc: 0.01 ng/mL (ref 0.00–0.08)

## 2016-12-10 LAB — CBC
HCT: 50.8 % (ref 39.0–52.0)
HEMOGLOBIN: 16.8 g/dL (ref 13.0–17.0)
MCH: 33.5 pg (ref 26.0–34.0)
MCHC: 33.1 g/dL (ref 30.0–36.0)
MCV: 101.2 fL — ABNORMAL HIGH (ref 78.0–100.0)
PLATELETS: 318 10*3/uL (ref 150–400)
RBC: 5.02 MIL/uL (ref 4.22–5.81)
RDW: 14.1 % (ref 11.5–15.5)
WBC: 8.8 10*3/uL (ref 4.0–10.5)

## 2016-12-10 LAB — BRAIN NATRIURETIC PEPTIDE: B NATRIURETIC PEPTIDE 5: 24.8 pg/mL (ref 0.0–100.0)

## 2016-12-10 MED ORDER — IOPAMIDOL (ISOVUE-370) INJECTION 76%
INTRAVENOUS | Status: AC
  Administered 2016-12-10: 80 mL
  Filled 2016-12-10: qty 100

## 2016-12-10 NOTE — Progress Notes (Addendum)
Preliminary results by tech - Left Lower Ext. Venous Duplex Completed. Negative for deep and superficial vein thrombosis or Baker's cyst. There is a mixed echogenicity, hypoechoic area noted in the proximal, medial calf area, etiology unknown. Possible hematoma versus rupture Baker's cyst. Oda Cogan, BS, RDMS, RVT

## 2016-12-10 NOTE — Telephone Encounter (Signed)
Patient Name: Dennis Martin  DOB: January 14, 1956    Initial Comment Caller thinks he may have a DVT in his left calf muscle. Red and swelling. Having shortness of breath.   Nurse Assessment  Nurse: Leilani Merl, RN, Heather Date/Time (Eastern Time): 12/10/2016 8:08:12 AM  Confirm and document reason for call. If symptomatic, describe symptoms. ---Caller thinks he may have a DVT in his left calf muscle. Red and swelling that started on Friday. Having shortness of breath.  Does the patient have any new or worsening symptoms? ---Yes  Will a triage be completed? ---Yes  Related visit to physician within the last 2 weeks? ---No  Does the PT have any chronic conditions? (i.e. diabetes, asthma, etc.) ---Yes  List chronic conditions. ---See MR  Is this a behavioral health or substance abuse call? ---No     Guidelines    Guideline Title Affirmed Question Affirmed Notes  Leg Pain Chest Pain    Final Disposition User   Go to ED Now [place], RN, [facility] - ED   Disagree/Comply: Comply

## 2016-12-10 NOTE — ED Triage Notes (Addendum)
To ED for eval of left leg (calf) pain for past week. States he initially thought he pulled a muscle working out but continues to increase. Noticed sob on Friday - intermittent. Noticed intermittent CP yesterday. No difficulty with sleeping. Left calf noticeably larger than right

## 2016-12-10 NOTE — Discharge Instructions (Signed)
You likely have a ruptured Baker cyst. Please rest, elevate your leg, ice, and take tylenol or ibuprofen as needed for pain for 1 week.  Please follow up with your primary doctor in 1 week. At that time, if you are not any better, your doctor may suggest another ultrasound of your leg, physical therapy referral, or orthopedic surgery referral.  If you have recurrent chest pain with exertion or at rest, please get help.

## 2016-12-10 NOTE — ED Notes (Signed)
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 NAD.

## 2016-12-10 NOTE — Telephone Encounter (Signed)
Per chart review tab pt went to [facility]. 

## 2016-12-10 NOTE — ED Provider Notes (Signed)
[facility] DEPT Provider Note   CSN: [id number] Arrival date & time: 12/10/16  0915  History   Chief Complaint Chief Complaint  Patient presents with  . Shortness of Breath  . Leg Pain   HPI  Mr. Viscomi is a 61yo male with PMH of DVT after knee arthroscopy (no longer on blood thinner), HTN, and essential thrombocytosis who presents with left leg swelling and pain as well as shortness of breath. He states he was lifting weights on Friday when he started noticing pain in his LLE, shortness of breath, and some right subcostal chest pain. Over the weekend, his LLE pain and swelling got worse and he has had intermittent episodes of SOB. Did have a DVT after knee arthroscopy in 2001. Was on coumadin for 6 months. No longer on blood thinner, only aspirin 81mg  daily. Does take testosterone. No recent travel. Did have surgery on both knees about a year ago. Denies cough or hemoptysis. Denies current shortness, fever, or breath or chest pain.  Denies current smoking (quit in 1478) or other illicit drugs. Drinks ~1 beer/day. No family history of blood clots. No known past medical history of diagnosed heart or lung disease.  Past Medical History:  Diagnosis Date  . Anxiety    hx of - 10 years ago  . Arthritis    both arthritis, wrist, hands   . BPH (benign prostatic hyperplasia)   . DVT of leg (deep venous thrombosis) ([place]) 2001   post knee arthroscopy,also complicated by infection, PICC line used for long term antibiotics, was on coumadin x's 6 months  . Essential thrombocytosis ([place])   . Headache    relative to testosterone level, it had been a problem, improved with clomid  . History of stress test 2008   done in [place] ,nuclear stress test- told that it was normal  . Hypertension   . Peripheral vascular disease ([place])   . Sleep apnea    did surgery for the problem  . Spleen enlarged   . Thrombocythemia ([place])    monitored by Dr. Earlie Server    Patient Active Problem List   Diagnosis Date Noted  . Yeast infection of the skin 05/13/2016  . OSA (obstructive sleep apnea) 08/30/2015  . Low testosterone 05/08/2015  . Erectile dysfunction 05/08/2015  . Splenomegaly 03/29/2015  . Hyperkalemia 05/03/2014  . Routine general medical examination at a health care facility 04/14/2012  . Essential thrombocytosis ([place]) 08/12/2011  . HTN (hypertension) 08/12/2011  . BPH (benign prostatic hyperplasia) 08/12/2011    Past Surgical History:  Procedure Laterality Date  . EYE SURGERY     chalazion on both eyelids  . KNEE SURGERY Bilateral 2001  . NASAL SINUS SURGERY    . TONSILLECTOMY    . TOTAL KNEE ARTHROPLASTY Right 05/22/2015  . TOTAL KNEE ARTHROPLASTY Right 05/22/2015   Procedure: TOTAL KNEE ARTHROPLASTY;  Surgeon: Frederik Pear, MD;  Location: [facility];  Service: Orthopedics;  Laterality: Right;  . TOTAL KNEE ARTHROPLASTY Left 07/17/2015  . TOTAL KNEE ARTHROPLASTY Left 07/17/2015   Procedure: TOTAL KNEE ARTHROPLASTY;  Surgeon: Frederik Pear, MD;  Location: [facility];  Service: Orthopedics;  Laterality: Left;  . ULNAR NERVE TRANSPOSITION  2007   left    Home Medications    Prior to Admission medications   Medication Sig Start Date End Date Taking? Authorizing Provider  aspirin EC 81 MG tablet Take 81 mg by mouth 2 (two) times daily.   Yes [provider]  calcium carbonate (TUMS EX) 750  MG chewable tablet Chew 1 tablet by mouth daily as needed for heartburn.   Yes [provider]  desonide (DESOWEN) 0.05 % cream Apply 1 application topically 2 (two) times daily as needed (rash).   Yes [provider]  folic acid (FOLVITE) 810 MCG tablet Take 400 mcg by mouth daily.   Yes [provider]  hydrocortisone 2.5 % lotion Apply 1 application topically daily as needed (rash/itching).    Yes [provider]  hydroxyurea (HYDREA) 500 MG capsule TAKE 1 CAPSULE DAILY EXCEPT TAKE 2 CAPSULES ON FRIDAYS 07/08/16  Yes Lucille Passy, MD  ketoconazole  (NIZORAL) 2 % cream Apply 1 application topically daily.   Yes [provider]  lisinopril (PRINIVIL,ZESTRIL) 10 MG tablet Take 1 tablet (10 mg total) by mouth daily. 05/14/16  Yes Lucille Passy, MD  nystatin (MYCOSTATIN/NYSTOP) powder APPLY TOPICALLY FOUR TIMES DAILY 07/24/16  Yes Lucille Passy, MD  OVER THE COUNTER MEDICATION Vitafusion men's complete vitamin gummies; pt chews two gummies daily.   Yes [provider]  tamsulosin (FLOMAX) 0.4 MG CAPS capsule Take 0.4 mg by mouth at bedtime.    Yes [provider]  triamcinolone cream (KENALOG) 0.1 % Apply 1 application topically 2 (two) times daily as needed (skin irritation).  04/29/13  Yes [provider]    Family History Family History  Problem Relation Age of Onset  . Colon cancer Paternal Aunt 48  . Stomach cancer Neg Hx     Social History Social History  Substance Use Topics  . Smoking status: Former Smoker    Quit date: 05/06/2003  . Smokeless tobacco: Never Used     Comment: Quit 2005  . Alcohol use 8.4 oz/week    14 Cans of beer per week     Comment: 2 beers daily     Allergies   Keflex [cephalexin]   Review of Systems Review of Systems  Unremarkable except as stated above in HPI.  Physical Exam Updated Vital Signs BP 123/77   Pulse 74   Temp 98.3 F (36.8 C) (Oral)   Resp (!) 22   SpO2 93%   Physical Exam  GEN: Well-appearing male lying in bed in NAD; Alert and oriented. HENT: Moist mucous membranes. No visible lesions. EYES: PERRL. Sclera anicteric. RESP: Clear to auscultation bilaterally. No wheezes, rales, or rhonchi. CV: Normal rate and regular rhythm. No murmurs, gallops, or rubs. 1+ LLE edema. No RLE edema. ABD: Soft. Non-tender. Non-distended. Normoactive bowel sounds. EXT: 1+ LLE edema; no RLE edema. No increased erythema or warmth of LLE. 2+ DP pulses. NEURO: Cranial nerves II-XII grossly intact. Able to lift all four extremities against gravity. Speech fluent and  appropriate.  ED Treatments / Results  Labs (all labs ordered are listed, but only abnormal results are displayed) Labs Reviewed  CBC - Abnormal; Notable for the following:       Result Value   MCV 101.2 (*)    All other components within normal limits  I-STAT CHEM 8, ED - Abnormal; Notable for the following:    BUN 26 (*)    Creatinine, Ser 1.30 (*)    Calcium, Ion 1.08 (*)    Hemoglobin 17.3 (*)    All other components within normal limits  BRAIN NATRIURETIC PEPTIDE  I-STAT TROPONIN, ED    EKG  EKG Interpretation  Date/Time:  Tuesday December 10 2016 09:33:31 EDT Ventricular Rate:  83 PR Interval:  134 QRS Duration: 90 QT Interval:  358 QTC  Calculation: 420 R Axis:   -69 Text Interpretation:  Normal sinus rhythm Pulmonary disease pattern Left anterior fascicular block Abnormal ECG higher t waves from prior, ? placement Otherwise no significant change Confirmed by Deno Etienne [phone]) on 12/10/2016 9:52:53 AM       Radiology Ct Angio Chest Pe W/cm &/or Wo Cm  Result Date: 12/10/2016 CLINICAL DATA:  Left leg/calf pain shortness of Breath EXAM: CT ANGIOGRAPHY CHEST WITH CONTRAST TECHNIQUE: Multidetector CT imaging of the chest was performed using the standard protocol during bolus administration of intravenous contrast. Multiplanar CT image reconstructions and MIPs were obtained to evaluate the vascular anatomy. CONTRAST:  80 cc Isovue 370 IV COMPARISON:  Chest x-ray earlier today. FINDINGS: Cardiovascular: No filling defects in the pulmonary arteries to suggest pulmonary emboli. Heart is normal size. Aorta is normal caliber. Mediastinum/Nodes: No mediastinal, hilar, or axillary adenopathy. Lungs/Pleura: Ground-glass opacities in the lung bases and dependent lower lobes most likely atelectasis. No effusions. Upper Abdomen: Imaging into the upper abdomen shows no acute findings. Musculoskeletal: Chest wall soft tissues are unremarkable. No acute bony abnormality. Review of the MIP  images confirms the above findings. IMPRESSION: No evidence of pulmonary embolus. Dependent and bibasilar ground-glass opacities, likely atelectasis. Electronically Signed   By: Rolm Baptise M.D.   On: 12/10/2016 11:27   Dg Chest Port 1 View  Result Date: 12/10/2016 CLINICAL DATA:  Left leg pain. EXAM: PORTABLE CHEST 1 VIEW COMPARISON:  05/12/2015 . FINDINGS: Mediastinum and hilar structures normal. Lungs are clear. Heart size normal. No pleural effusion or pneumothorax. IMPRESSION: No acute cardiopulmonary disease. Electronically Signed   By: Marcello Moores  Register   On: 12/10/2016 10:27    Procedures Procedures (including critical care time)  Medications Ordered in ED Medications  iopamidol (ISOVUE-370) 76 % injection (80 mLs  Contrast Given 12/10/16 1108)     Initial Impression / Assessment and Plan / ED Course  I have reviewed the triage vital signs and the nursing notes.  Pertinent labs & imaging results that were available during my care of the patient were reviewed by me and considered in my medical decision making (see chart for details).  Mr. Kaluzny is a 61yo male with PMH of DVT after knee arthroscopy (no longer on blood thinner), HTN, and essential thrombocytosis who presents with LLE swelling and episodic shortness of breath, concerning for possible DVT/PE. Has had history of DVT, not on blood thinner now, as well as current testosterone use. No recent travel or surgery and not tachycardic. EKG with NSR and LAFB, unchanged from prior. Wells' Criteria at least 4.5; puts him at moderate risk. Will order CBC, BMP, BNP, troponin, CXR, and CT-A for PE.  1:47pm LE U/S with possible hematoma vs ruptured Baker's cyst. Labs and other imaging unremarkable. CT-A negative for PE. Left leg pain and swelling likely 2/2 ruptured Baker's cyst. Patient is stable for discharge. Return precautions provided. Recommended to rest, ice, elevate, and NSAIDs/tylenol for analgesia for 1 week. Follow up with  primary care doctor in 1 week.  Patient seen and discussed with Dr. Tyrone Nine.  Final Clinical Impressions(s) / ED Diagnoses   Final diagnoses:  Ruptured Bakers cyst    New Prescriptions New Prescriptions   No medications on file     Colbert Ewing, MD 12/10/16 [place], [place], DO 12/10/16 1354

## 2016-12-10 NOTE — ED Notes (Signed)
ED Provider at bedside. 

## 2016-12-12 ENCOUNTER — Ambulatory Visit (INDEPENDENT_AMBULATORY_CARE_PROVIDER_SITE_OTHER): Payer: BLUE CROSS/BLUE SHIELD

## 2016-12-12 DIAGNOSIS — Z23 Encounter for immunization: Secondary | ICD-10-CM | POA: Diagnosis not present

## 2016-12-17 ENCOUNTER — Ambulatory Visit (INDEPENDENT_AMBULATORY_CARE_PROVIDER_SITE_OTHER): Payer: BLUE CROSS/BLUE SHIELD | Admitting: Family Medicine

## 2016-12-17 ENCOUNTER — Encounter: Payer: Self-pay | Admitting: Family Medicine

## 2016-12-17 VITALS — BP 122/70 | HR 79 | Temp 98.0°F | Resp 16 | Wt 188.0 lb

## 2016-12-17 DIAGNOSIS — M7989 Other specified soft tissue disorders: Secondary | ICD-10-CM | POA: Diagnosis not present

## 2016-12-17 DIAGNOSIS — D473 Essential (hemorrhagic) thrombocythemia: Secondary | ICD-10-CM

## 2016-12-17 DIAGNOSIS — M79662 Pain in left lower leg: Secondary | ICD-10-CM | POA: Diagnosis not present

## 2016-12-17 NOTE — Assessment & Plan Note (Signed)
New- probable baker's cyst. DVT and CTA angio neg for DVT/PE.

## 2016-12-17 NOTE — Patient Instructions (Signed)
Baker Cyst A Baker cyst, also called a popliteal cyst, is a sac-like growth that forms at the back of the knee. The cyst forms when the fluid-filled sac (bursa) that cushions the knee joint becomes enlarged. The bursa that becomes a Baker cyst is located at the back of the knee joint. What are the causes? In most cases, a Baker cyst results from another knee problem that causes swelling inside the knee. This makes the fluid inside the knee joint (synovial fluid) flow into the bursa behind the knee, causing the bursa to enlarge. What increases the risk? You may be more likely to develop a Baker cyst if you already have a knee problem, such as:  A tear in cartilage that cushions the knee joint (meniscal tear).  A tear in the tissues that connect the bones of the knee joint (ligament tear).  Knee swelling from osteoarthritis, rheumatoid arthritis, or gout.  What are the signs or symptoms? A Baker cyst does not always cause symptoms. A lump behind the knee may be the only sign of the condition. The lump may be painful, especially when the knee is straightened. If the lump is painful, the pain may come and go. The knee may also be stiff. Symptoms may quickly get more severe if the cyst breaks open (ruptures). If your cyst ruptures, signs and symptoms may affect the knee and the back of the lower leg (calf) and may include:  Sudden or worsening pain.  Swelling.  Bruising.  How is this diagnosed? This condition may be diagnosed based on your symptoms and medical history. Your health care provider will also do a physical exam. This may include:  Feeling the cyst to check whether it is tender.  Checking your knee for signs of another knee condition that causes swelling.  You may have imaging tests, such as:  X-rays.  MRI.  Ultrasound.  How is this treated? A Baker cyst that is not painful may go away without treatment. If the cyst gets large or painful, it will likely get better if the  underlying knee problem is treated. Treatment for a Baker cyst may include:  Resting.  Keeping weight off of the knee. This means not leaning on the knee to support your body weight.  NSAIDs to reduce pain and swelling.  A procedure to drain the fluid from the cyst with a needle (aspiration). You may also get an injection of a medicine that reduces swelling (steroid).  Surgery. This may be needed if other treatments do not work. This usually involves correcting knee damage and removing the cyst.  Follow these instructions at home:  Take over-the-counter and prescription medicines only as told by your health care provider.  Rest and return to your normal activities as told by your health care provider. Avoid activities that make pain or swelling worse. Ask your health care provider what activities are safe for you.  Keep all follow-up visits as told by your health care provider. This is important. Contact a health care provider if:  You have knee pain, stiffness, or swelling that does not get better. Get help right away if:  You have sudden or worsening pain and swelling in your calf area. This information is not intended to replace advice given to you by your health care provider. Make sure you discuss any questions you have with your health care provider. Document Released: 03/11/2005 Document Revised: 11/30/2015 Document Reviewed: 11/30/2015 Elsevier Interactive Patient Education  2018 [employer] Inc.  

## 2016-12-17 NOTE — Progress Notes (Signed)
Subjective:   Patient ID: Dennis Martin, male    DOB: 1956/01/06, 61 y.o.   MRN: [id number]  Enrique Manganaro is a pleasant 61 y.o. year old male who presents to clinic today with Follow-up  on 12/17/2016  HPI:  Here for ER follow up.   Was seen in ER on 12/10/2016 after he presented with left leg pain, swelling and SOB.  Notes reviewed.  He does have a remove h/o DVT following knee surgery and history of essential thrombocytosis. He is on testosterone.  Dopplers neg.   CTA angio neg.  EKG NSR  CBC, BMET, BNP, troponin, CXR neg.  Ct Angio Chest Pe W/cm &/or Wo Cm  Result Date: 12/10/2016 CLINICAL DATA:  Left leg/calf pain shortness of Breath EXAM: CT ANGIOGRAPHY CHEST WITH CONTRAST TECHNIQUE: Multidetector CT imaging of the chest was performed using the standard protocol during bolus administration of intravenous contrast. Multiplanar CT image reconstructions and MIPs were obtained to evaluate the vascular anatomy. CONTRAST:  80 cc Isovue 370 IV COMPARISON:  Chest x-ray earlier today. FINDINGS: Cardiovascular: No filling defects in the pulmonary arteries to suggest pulmonary emboli. Heart is normal size. Aorta is normal caliber. Mediastinum/Nodes: No mediastinal, hilar, or axillary adenopathy. Lungs/Pleura: Ground-glass opacities in the lung bases and dependent lower lobes most likely atelectasis. No effusions. Upper Abdomen: Imaging into the upper abdomen shows no acute findings. Musculoskeletal: Chest wall soft tissues are unremarkable. No acute bony abnormality. Review of the MIP images confirms the above findings. IMPRESSION: No evidence of pulmonary embolus. Dependent and bibasilar ground-glass opacities, likely atelectasis. Electronically Signed   By: Rolm Baptise M.D.   On: 12/10/2016 11:27   Dg Chest Port 1 View  Result Date: 12/10/2016 CLINICAL DATA:  Left leg pain. EXAM: PORTABLE CHEST 1 VIEW COMPARISON:  05/12/2015 . FINDINGS: Mediastinum and hilar structures normal. Lungs are  clear. Heart size normal. No pleural effusion or pneumothorax. IMPRESSION: No acute cardiopulmonary disease. Electronically Signed   By: Marcello Moores  Register   On: 12/10/2016 10:27   Advised likely [facility] and to follow up with me here today.  Current Outpatient Prescriptions on File Prior to Visit  Medication Sig Dispense Refill  . aspirin EC 81 MG tablet Take 81 mg by mouth 2 (two) times daily.    . calcium carbonate (TUMS EX) 750 MG chewable tablet Chew 1 tablet by mouth daily as needed for heartburn.    . desonide (DESOWEN) 0.05 % cream Apply 1 application topically 2 (two) times daily as needed (rash).    . folic acid (FOLVITE) 710 MCG tablet Take 400 mcg by mouth daily.    . hydrocortisone 2.5 % lotion Apply 1 application topically daily as needed (rash/itching).     . hydroxyurea (HYDREA) 500 MG capsule TAKE 1 CAPSULE DAILY EXCEPT TAKE 2 CAPSULES ON FRIDAYS 105 capsule 3  . ketoconazole (NIZORAL) 2 % cream Apply 1 application topically daily.    Marland Kitchen lisinopril (PRINIVIL,ZESTRIL) 10 MG tablet Take 1 tablet (10 mg total) by mouth daily. 90 tablet 2  . nystatin (MYCOSTATIN/NYSTOP) powder APPLY TOPICALLY FOUR TIMES DAILY 15 g 9  . OVER THE COUNTER MEDICATION Vitafusion men's complete vitamin gummies; pt chews two gummies daily.    . tamsulosin (FLOMAX) 0.4 MG CAPS capsule Take 0.4 mg by mouth at bedtime.     . triamcinolone cream (KENALOG) 0.1 % Apply 1 application topically 2 (two) times daily as needed (skin irritation).      No current facility-administered medications on file prior to visit.  Allergies  Allergen Reactions  . Keflex [Cephalexin] Rash    Past Medical History:  Diagnosis Date  . Anxiety    hx of - 10 years ago  . Arthritis    both arthritis, wrist, hands   . BPH (benign prostatic hyperplasia)   . DVT of leg (deep venous thrombosis) ([place]) 2001   post knee arthroscopy,also complicated by infection, PICC line used for long term antibiotics, was on coumadin x's 6 months    . Essential thrombocytosis ([place])   . Headache    relative to testosterone level, it had been a problem, improved with clomid  . History of stress test 2008   done in [place] ,nuclear stress test- told that it was normal  . Hypertension   . Peripheral vascular disease ([place])   . Sleep apnea    did surgery for the problem  . Spleen enlarged   . Thrombocythemia ([place])    monitored by Dr. Earlie Server    Past Surgical History:  Procedure Laterality Date  . EYE SURGERY     chalazion on both eyelids  . KNEE SURGERY Bilateral 2001  . NASAL SINUS SURGERY    . TONSILLECTOMY    . TOTAL KNEE ARTHROPLASTY Right 05/22/2015  . TOTAL KNEE ARTHROPLASTY Right 05/22/2015   Procedure: TOTAL KNEE ARTHROPLASTY;  Surgeon: Frederik Pear, MD;  Location: [facility];  Service: Orthopedics;  Laterality: Right;  . TOTAL KNEE ARTHROPLASTY Left 07/17/2015  . TOTAL KNEE ARTHROPLASTY Left 07/17/2015   Procedure: TOTAL KNEE ARTHROPLASTY;  Surgeon: Frederik Pear, MD;  Location: [facility];  Service: Orthopedics;  Laterality: Left;  . ULNAR NERVE TRANSPOSITION  2007   left    Family History  Problem Relation Age of Onset  . Colon cancer Paternal Aunt 13  . Stomach cancer Neg Hx     Social History   Social History  . Marital status: Married    Spouse name: N/A  . Number of children: N/A  . Years of education: N/A   Occupational History  . Not on file.   Social History Main Topics  . Smoking status: Former Smoker    Quit date: 05/06/2003  . Smokeless tobacco: Never Used     Comment: Quit 2005  . Alcohol use 8.4 oz/week    14 Cans of beer per week     Comment: 2 beers daily  . Drug use: No  . Sexual activity: Not on file   Other Topics Concern  . Not on file   Social History Narrative  . No narrative on file   The PMH, PSH, Social History, Family History, Medications, and allergies have been reviewed in [facility], and have been updated if relevant.  Review of Systems  Constitutional: Negative.    Respiratory: Positive for shortness of breath.   Cardiovascular: Positive for leg swelling. Negative for chest pain and palpitations.  Musculoskeletal: Negative.   Neurological: Negative.   Psychiatric/Behavioral: Negative.   All other systems reviewed and are negative.      Objective:    BP 122/70 (BP Location: Left Arm, Patient Position: Sitting, Cuff Size: Normal)   Pulse 79   Temp 98 F (36.7 C) (Oral)   Resp 16   Wt 188 lb (85.3 kg)   SpO2 98%   BMI 26.41 kg/m    Physical Exam  Constitutional: He is oriented to person, place, and time. He appears well-developed and well-nourished. No distress.  HENT:  Head: Normocephalic and atraumatic.  Eyes: Conjunctivae are normal.  Cardiovascular: Normal  rate.   Pulmonary/Chest: Effort normal.  Musculoskeletal:       Left knee: He exhibits swelling. He exhibits normal range of motion, no effusion, no ecchymosis, no deformity, no laceration, no erythema, normal alignment, no bony tenderness and normal meniscus.  Neurological: He is alert and oriented to person, place, and time. No cranial nerve deficit.  Skin: Skin is warm and dry. He is not diaphoretic.  Psychiatric: He has a normal mood and affect. His behavior is normal. Judgment and thought content normal.  Nursing note and vitals reviewed.         Assessment & Plan:   Essential thrombocytosis (HCC)  Pain and swelling of left lower leg No Follow-up on file.

## 2016-12-20 ENCOUNTER — Ambulatory Visit (INDEPENDENT_AMBULATORY_CARE_PROVIDER_SITE_OTHER): Payer: BLUE CROSS/BLUE SHIELD | Admitting: Internal Medicine

## 2016-12-20 ENCOUNTER — Encounter: Payer: Self-pay | Admitting: Internal Medicine

## 2016-12-20 DIAGNOSIS — G4733 Obstructive sleep apnea (adult) (pediatric): Secondary | ICD-10-CM | POA: Diagnosis not present

## 2016-12-20 NOTE — Assessment & Plan Note (Signed)
He states he is clearly better off with CPAP than without. Very comfortable with current pressures and the download looks quite good. Not clear why he is more aware of dry mouth in the last several weeks, long after change to nasal pillows mask and not obviously associated with heat/air conditioning. Plan-continue auto 5-15. We discussed adjustment of his humidifier and ways to keep his mouth feeling moist.

## 2016-12-20 NOTE — Patient Instructions (Signed)
We can contiue CPAP auto 5-15, mask of choice, humidifier, supplies, AirView  Dx OSA  You can adjust the humidifier setting - check your pamphlet or ask your DME company how to do that. The settings are for your comfort, so you can change it as you wish.  Please call if we can help.

## 2016-12-20 NOTE — Progress Notes (Signed)
HPI M former smoker followed for OSA, complicated by HBP Hx UPPP Unattended home sleep test 08/27/15- AHI 26.6/hour, desaturation to 78%, body weight 177 pounds ---------------------------------------------------------------------------------------------  12/20/16-61 year old male followed for OSA. Original diagnosis in the 1990s, treated with UPPP. Worsening symptoms in recent years and had seen Dr. Murlean Iba in 2017. for OSA. Former [employer] pt. Uses [facility] as DME.  CPAP auto 5-15/Advanced Download compliance 97%, averaging almost 8 hours per night, AHI 0.5/hour. He changed to nasal pillows mask several months ago and likes it much better than fullface. For unclear reason, in the last 6 weeks he has been more aware of dry mouth. He says he sleeps with mouth closed. Has a chinstrap tried only once.  ROS-see HPI   + = positive Constitutional:    weight loss, night sweats, fevers, chills, fatigue, lassitude. HEENT:    headaches, difficulty swallowing, tooth/dental problems, sore throat,       sneezing, itching, ear ache, nasal congestion, post nasal drip, snoring CV:    chest pain, orthopnea, PND, swelling in lower extremities, anasarca,                                                  dizziness, palpitations Resp:   shortness of breath with exertion or at rest.                productive cough,   non-productive cough, coughing up of blood.              change in color of mucus.  wheezing.   Skin:    rash or lesions. GI:  No-   heartburn, indigestion, abdominal pain, nausea, vomiting, diarrhea,                 change in bowel habits, loss of appetite GU: dysuria, change in color of urine, no urgency or frequency.   flank pain. MS:   +joint pain, stiffness, decreased range of motion, back pain. Neuro-     nothing unusual Psych:  change in mood or affect.  depression or anxiety.   memory loss.  OBJ- Physical Exam General- Alert, Oriented, Affect-appropriate, Distress- none acute, l+ean/ fit  appearing Skin- rash-none, lesions- none, excoriation- none Lymphadenopathy- none Head- atraumatic            Eyes- Gross vision intact, PERRLA, conjunctivae and secretions clear            Ears- Hearing, canals-normal            Nose- Clear, no-Septal dev, mucus, polyps, erosion, perforation             Throat- Mallampati II/ UPPP , mucosa clear- not dry , drainage- none, tonsils- atrophic Neck- flexible , trachea midline, no stridor , thyroid nl, carotid no bruit Chest - symmetrical excursion , unlabored           Heart/CV- RRR , no murmur , no gallop  , no rub, nl s1 s2                           - JVD- none , edema- none, stasis changes- none, varices- none           Lung- clear to P&A, wheeze- none, cough- none , dullness-none, rub- none  Chest wall-  Abd-  Br/ Gen/ Rectal- Not done, not indicated Extrem- cyanosis- none, clubbing, none, atrophy- none, strength- nl Neuro- grossly intact to observation

## 2016-12-24 NOTE — Telephone Encounter (Signed)
Spoke with patient, he does not want to schedule the 04/2017 cpe at this time, but he will call back when he is ready to schedule with Dr. Deborra Medina at [facility]. Noted in permanent comments that patient will transition to [place]

## 2016-12-26 DIAGNOSIS — M25562 Pain in left knee: Secondary | ICD-10-CM | POA: Diagnosis not present

## 2016-12-31 DIAGNOSIS — E291 Testicular hypofunction: Secondary | ICD-10-CM | POA: Diagnosis not present

## 2017-01-07 DIAGNOSIS — E291 Testicular hypofunction: Secondary | ICD-10-CM | POA: Diagnosis not present

## 2017-02-04 DIAGNOSIS — M7701 Medial epicondylitis, right elbow: Secondary | ICD-10-CM | POA: Diagnosis not present

## 2017-03-27 DIAGNOSIS — M7701 Medial epicondylitis, right elbow: Secondary | ICD-10-CM | POA: Diagnosis not present

## 2017-04-10 ENCOUNTER — Telehealth: Payer: Self-pay

## 2017-04-10 DIAGNOSIS — N401 Enlarged prostate with lower urinary tract symptoms: Secondary | ICD-10-CM

## 2017-04-10 DIAGNOSIS — R7989 Other specified abnormal findings of blood chemistry: Secondary | ICD-10-CM

## 2017-04-10 DIAGNOSIS — Z13220 Encounter for screening for lipoid disorders: Secondary | ICD-10-CM

## 2017-04-10 DIAGNOSIS — Z0000 Encounter for general adult medical examination without abnormal findings: Secondary | ICD-10-CM

## 2017-04-10 DIAGNOSIS — I10 Essential (primary) hypertension: Secondary | ICD-10-CM

## 2017-04-10 DIAGNOSIS — G4733 Obstructive sleep apnea (adult) (pediatric): Secondary | ICD-10-CM

## 2017-04-10 NOTE — Telephone Encounter (Signed)
TA-Pt is scheduled for CPE on 2.28.19/labs are scheduled for 2.21.19/For future labs; would you like CMP; CBC; Lipid; TSH; PSA; Lactate Dehydrogenase? Plz advise/thx dmf

## 2017-04-10 NOTE — Telephone Encounter (Signed)
Yes please!!  Thank you for all you do!

## 2017-04-11 NOTE — Telephone Encounter (Signed)
Future orders placed/thx dmf 

## 2017-04-30 DIAGNOSIS — G4733 Obstructive sleep apnea (adult) (pediatric): Secondary | ICD-10-CM | POA: Diagnosis not present

## 2017-05-04 ENCOUNTER — Other Ambulatory Visit: Payer: Self-pay | Admitting: Family Medicine

## 2017-05-15 ENCOUNTER — Other Ambulatory Visit (INDEPENDENT_AMBULATORY_CARE_PROVIDER_SITE_OTHER): Payer: BLUE CROSS/BLUE SHIELD

## 2017-05-15 ENCOUNTER — Telehealth: Payer: Self-pay | Admitting: Emergency Medicine

## 2017-05-15 DIAGNOSIS — G4733 Obstructive sleep apnea (adult) (pediatric): Secondary | ICD-10-CM

## 2017-05-15 DIAGNOSIS — R7989 Other specified abnormal findings of blood chemistry: Secondary | ICD-10-CM

## 2017-05-15 DIAGNOSIS — Z09 Encounter for follow-up examination after completed treatment for conditions other than malignant neoplasm: Secondary | ICD-10-CM | POA: Diagnosis not present

## 2017-05-15 DIAGNOSIS — Z13220 Encounter for screening for lipoid disorders: Secondary | ICD-10-CM | POA: Diagnosis not present

## 2017-05-15 DIAGNOSIS — N401 Enlarged prostate with lower urinary tract symptoms: Secondary | ICD-10-CM

## 2017-05-15 DIAGNOSIS — Z96652 Presence of left artificial knee joint: Secondary | ICD-10-CM | POA: Diagnosis not present

## 2017-05-15 DIAGNOSIS — Z0000 Encounter for general adult medical examination without abnormal findings: Secondary | ICD-10-CM

## 2017-05-15 DIAGNOSIS — M25562 Pain in left knee: Secondary | ICD-10-CM | POA: Diagnosis not present

## 2017-05-15 DIAGNOSIS — I10 Essential (primary) hypertension: Secondary | ICD-10-CM | POA: Diagnosis not present

## 2017-05-15 DIAGNOSIS — Z96651 Presence of right artificial knee joint: Secondary | ICD-10-CM | POA: Diagnosis not present

## 2017-05-15 DIAGNOSIS — M25561 Pain in right knee: Secondary | ICD-10-CM | POA: Diagnosis not present

## 2017-05-15 LAB — CBC WITH DIFFERENTIAL/PLATELET
BASOS ABS: 0.1 10*3/uL (ref 0.0–0.1)
Basophils Relative: 1.2 % (ref 0.0–3.0)
Eosinophils Absolute: 0.2 10*3/uL (ref 0.0–0.7)
Eosinophils Relative: 3.4 % (ref 0.0–5.0)
HCT: 56.1 % — ABNORMAL HIGH (ref 39.0–52.0)
Hemoglobin: 18.7 g/dL (ref 13.0–17.0)
LYMPHS ABS: 0.9 10*3/uL (ref 0.7–4.0)
Lymphocytes Relative: 14.7 % (ref 12.0–46.0)
MCHC: 33.4 g/dL (ref 30.0–36.0)
MCV: 106.3 fl — AB (ref 78.0–100.0)
MONOS PCT: 9.2 % (ref 3.0–12.0)
Monocytes Absolute: 0.6 10*3/uL (ref 0.1–1.0)
Neutro Abs: 4.6 10*3/uL (ref 1.4–7.7)
Neutrophils Relative %: 71.5 % (ref 43.0–77.0)
Platelets: 359 10*3/uL (ref 150.0–400.0)
RBC: 5.28 Mil/uL (ref 4.22–5.81)
RDW: 13.3 % (ref 11.5–15.5)
WBC: 6.4 10*3/uL (ref 4.0–10.5)

## 2017-05-15 LAB — LIPID PANEL
CHOL/HDL RATIO: 3
Cholesterol: 138 mg/dL (ref 0–200)
HDL: 39.8 mg/dL (ref >39.00)
LDL Cholesterol: 82 mg/dL (ref 0–99)
NONHDL: 97.93
Triglycerides: 81 mg/dL (ref 0.0–149.0)
VLDL: 16.2 mg/dL (ref 0.0–40.0)

## 2017-05-15 LAB — COMPREHENSIVE METABOLIC PANEL
ALK PHOS: 69 U/L (ref 39–117)
ALT: 16 U/L (ref 0–53)
AST: 18 U/L (ref 0–37)
Albumin: 4.3 g/dL (ref 3.5–5.2)
BILIRUBIN TOTAL: 0.9 mg/dL (ref 0.2–1.2)
BUN: 19 mg/dL (ref 6–23)
CO2: 34 mEq/L — ABNORMAL HIGH (ref 19–32)
CREATININE: 1.16 mg/dL (ref 0.40–1.50)
Calcium: 9.8 mg/dL (ref 8.4–10.5)
Chloride: 103 mEq/L (ref 96–112)
GFR: 68.29 mL/min (ref >60.00)
GLUCOSE: 87 mg/dL (ref 70–99)
Potassium: 5.3 mEq/L — ABNORMAL HIGH (ref 3.5–5.1)
SODIUM: 141 meq/L (ref 135–145)
TOTAL PROTEIN: 6.1 g/dL (ref 6.0–8.3)

## 2017-05-15 LAB — TSH: TSH: 1.75 u[IU]/mL (ref 0.35–4.50)

## 2017-05-15 LAB — PSA: PSA: 0.87 ng/mL (ref 0.10–4.00)

## 2017-05-15 NOTE — Telephone Encounter (Signed)
That is high.  I would like to see the rest of his CBC.  Will route this to his hematologist, Dr. Julien Nordmann for further input.

## 2017-05-15 NOTE — Telephone Encounter (Signed)
He needs phlebotomy to keep to keep HCT around 45%. [facility] or [facility] with [facility] at the [facility].

## 2017-05-15 NOTE — Telephone Encounter (Signed)
TA-So they called with a critical for the HGB being 18.7/it has not interfaced as of yet/thx dmf

## 2017-05-15 NOTE — Telephone Encounter (Signed)
Shanique from [facility] called about a critical lab results for  patient. Patient hemoglobin is elevated at 18.7.

## 2017-05-16 ENCOUNTER — Telehealth: Payer: Self-pay | Admitting: Medical Oncology

## 2017-05-16 ENCOUNTER — Telehealth: Payer: Self-pay | Admitting: Internal Medicine

## 2017-05-16 LAB — LACTATE DEHYDROGENASE: LDH: 205 U/L (ref 120–250)

## 2017-05-16 NOTE — Telephone Encounter (Signed)
Pt  scheduled for lab, Dennis Martin and phlebotomy next week.

## 2017-05-16 NOTE — Telephone Encounter (Signed)
Scheduled appt per 2/22 sch message - pt is aware of appt time and date.

## 2017-05-16 NOTE — Telephone Encounter (Signed)
Pt aware/Dr. Worthy Flank asst talked with him and has him scheduled/thx dmf

## 2017-05-16 NOTE — Telephone Encounter (Signed)
Please make sure patient has received the message below from Dr. Julien Nordmann.  Thank you.

## 2017-05-20 ENCOUNTER — Telehealth: Payer: Self-pay | Admitting: Medical Oncology

## 2017-05-20 NOTE — Telephone Encounter (Signed)
I called pt back he requests Mohamed to send labs to Dr. Burman Nieves, [facility]. Pt on testosterone. He requests Mohamed to collaborate with him. Labs faxed to Dr Louis Meckel.

## 2017-05-21 ENCOUNTER — Encounter: Payer: Self-pay | Admitting: Family Medicine

## 2017-05-21 ENCOUNTER — Ambulatory Visit (INDEPENDENT_AMBULATORY_CARE_PROVIDER_SITE_OTHER): Payer: BLUE CROSS/BLUE SHIELD | Admitting: Family Medicine

## 2017-05-21 VITALS — BP 122/66 | HR 71 | Temp 97.5°F | Ht 71.0 in | Wt 181.0 lb

## 2017-05-21 DIAGNOSIS — N401 Enlarged prostate with lower urinary tract symptoms: Secondary | ICD-10-CM | POA: Diagnosis not present

## 2017-05-21 DIAGNOSIS — I10 Essential (primary) hypertension: Secondary | ICD-10-CM

## 2017-05-21 DIAGNOSIS — D473 Essential (hemorrhagic) thrombocythemia: Secondary | ICD-10-CM | POA: Diagnosis not present

## 2017-05-21 DIAGNOSIS — Z23 Encounter for immunization: Secondary | ICD-10-CM

## 2017-05-21 DIAGNOSIS — Z0000 Encounter for general adult medical examination without abnormal findings: Secondary | ICD-10-CM | POA: Diagnosis not present

## 2017-05-21 DIAGNOSIS — R7989 Other specified abnormal findings of blood chemistry: Secondary | ICD-10-CM

## 2017-05-21 NOTE — Assessment & Plan Note (Signed)
Well controlled. No changes made today. 

## 2017-05-21 NOTE — Assessment & Plan Note (Signed)
Followed by urology.   

## 2017-05-21 NOTE — Patient Instructions (Signed)
Great to see you.  Please schedule a follow up nurse visit for your second shingrix dose in 2 months.

## 2017-05-21 NOTE — Assessment & Plan Note (Signed)
Hct elevated- scheduled for phlebotomy with hematology. No changes made today.

## 2017-05-21 NOTE — Assessment & Plan Note (Signed)
Reviewed preventive care protocols, scheduled due services, and updated immunizations Discussed nutrition, exercise, diet, and healthy lifestyle.  

## 2017-05-21 NOTE — Assessment & Plan Note (Signed)
On testosterone. Followed by urology.

## 2017-05-21 NOTE — Progress Notes (Signed)
Subjective:   Patient ID: Dennis Martin, male    DOB: 14-Oct-1955, 62 y.o.   MRN: [id number]  Dennis Martin is a pleasant 62 y.o. year old male who presents to clinic today with Annual Exam (Patient is here today for a CPE.  Fasting labs were drawn 2.21.19.  Ins will cover a Shingrix at 100%.)  and follow up of chronic medical conditions on 05/21/2017  HPI:  Health Maintenance  Topic Date Due  . HIV Screening  07/09/1972  . COLONOSCOPY  05/26/2022  . TETANUS/TDAP  04/08/2023  . INFLUENZA VACCINE  Completed  . Hepatitis C Screening  Completed   Essential Thrombocytosis- diagnosed in 2006. Takes hydroxyurea, folate.  CBC this month showed elevated H/H- I forwarded results to his hematologist, Dr. Julien Nordmann who wants his hct to be around 45% and scheduling him for phlebotomy.  He is on testosterone.  Lab Results  Component Value Date   WBC 6.4 05/15/2017   HGB 18.7 Repeated and verified X2. (Minor) 05/15/2017   HCT 56.1 Repeated and verified X2. (H) 05/15/2017   MCV 106.3 (H) 05/15/2017   PLT 359.0 05/15/2017    BPH/Low t- followed by Dr. Louis Meckel at [facility] urology.  He is on  Lab Results  Component Value Date   PSA 0.87 05/15/2017   PSA 0.53 05/06/2016   PSA 0.56 10/04/2015   HTN- well controlled on lisinopril 10 mg daily. Denies HA, blurred vision, CP or SOB.  Lab Results  Component Value Date   CHOL 138 05/15/2017   HDL 39.80 05/15/2017   LDLCALC 82 05/15/2017   TRIG 81.0 05/15/2017   CHOLHDL 3 05/15/2017   Lab Results  Component Value Date   ALT 16 05/15/2017   AST 18 05/15/2017   ALKPHOS 69 05/15/2017   BILITOT 0.9 05/15/2017   Lab Results  Component Value Date   NA 141 05/15/2017   K 5.3 (H) 05/15/2017   CL 103 05/15/2017   CO2 34 (H) 05/15/2017   Lab Results  Component Value Date   CREATININE 1.16 05/15/2017       Current Outpatient Medications on File Prior to Visit  Medication Sig Dispense Refill  . aspirin EC 81 MG tablet Take 81 mg by mouth 2  (two) times daily.    . calcium carbonate (TUMS EX) 750 MG chewable tablet Chew 1 tablet by mouth daily as needed for heartburn.    . desonide (DESOWEN) 0.05 % cream Apply 1 application topically 2 (two) times daily as needed (rash).    . folic acid (FOLVITE) 409 MCG tablet Take 400 mcg by mouth daily.    . hydrocortisone 2.5 % lotion Apply 1 application topically daily as needed (rash/itching).     . hydroxyurea (HYDREA) 500 MG capsule TAKE 1 CAPSULE DAILY EXCEPT TAKE 2 CAPSULES ON FRIDAYS 105 capsule 3  . ketoconazole (NIZORAL) 2 % cream Apply 1 application topically daily.    Marland Kitchen lisinopril (PRINIVIL,ZESTRIL) 10 MG tablet TAKE 1 TABLET DAILY 90 tablet 1  . nystatin (MYCOSTATIN/NYSTOP) powder APPLY TOPICALLY FOUR TIMES DAILY 15 g 9  . OVER THE COUNTER MEDICATION Vitafusion men's complete vitamin gummies; pt chews two gummies daily.    . tamsulosin (FLOMAX) 0.4 MG CAPS capsule Take 0.4 mg by mouth at bedtime.     . Testosterone (AXIRON) 30 MG/ACT SOLN 60 mg by Pump Prime route daily.    Marland Kitchen triamcinolone cream (KENALOG) 0.1 % Apply 1 application topically 2 (two) times daily as needed (skin irritation).  No current facility-administered medications on file prior to visit.     Allergies  Allergen Reactions  . Keflex [Cephalexin] Rash    Past Medical History:  Diagnosis Date  . Anxiety    hx of - 10 years ago  . Arthritis    both arthritis, wrist, hands   . BPH (benign prostatic hyperplasia)   . DVT of leg (deep venous thrombosis) ([place]) 2001   post knee arthroscopy,also complicated by infection, PICC line used for long term antibiotics, was on coumadin x's 6 months  . Essential thrombocytosis ([place])   . Headache    relative to testosterone level, it had been a problem, improved with clomid  . History of stress test 2008   done in [place] ,nuclear stress test- told that it was normal  . Hypertension   . Peripheral vascular disease ([place])   . Sleep apnea    did surgery for  the problem  . Spleen enlarged   . Thrombocythemia ([place])    monitored by Dr. Earlie Server    Past Surgical History:  Procedure Laterality Date  . EYE SURGERY     chalazion on both eyelids  . KNEE SURGERY Bilateral 2001  . NASAL SINUS SURGERY    . TONSILLECTOMY    . TOTAL KNEE ARTHROPLASTY Right 05/22/2015  . TOTAL KNEE ARTHROPLASTY Right 05/22/2015   Procedure: TOTAL KNEE ARTHROPLASTY;  Surgeon: Frederik Pear, MD;  Location: [facility];  Service: Orthopedics;  Laterality: Right;  . TOTAL KNEE ARTHROPLASTY Left 07/17/2015  . TOTAL KNEE ARTHROPLASTY Left 07/17/2015   Procedure: TOTAL KNEE ARTHROPLASTY;  Surgeon: Frederik Pear, MD;  Location: [facility];  Service: Orthopedics;  Laterality: Left;  . ULNAR NERVE TRANSPOSITION  2007   left    Family History  Problem Relation Age of Onset  . Colon cancer Paternal Aunt 34  . Stomach cancer Neg Hx     Social History   Socioeconomic History  . Marital status: Married    Spouse name: Not on file  . Number of children: Not on file  . Years of education: Not on file  . Highest education level: Not on file  Social Needs  . Financial resource strain: Not on file  . Food insecurity - worry: Not on file  . Food insecurity - inability: Not on file  . Transportation needs - medical: Not on file  . Transportation needs - non-medical: Not on file  Occupational History  . Not on file  Tobacco Use  . Smoking status: Former Smoker    Last attempt to quit: 05/06/2003    Years since quitting: 14.0  . Smokeless tobacco: Never Used  . Tobacco comment: Quit 2005  Substance and Sexual Activity  . Alcohol use: Yes    Alcohol/week: 8.4 oz    Types: 14 Cans of beer per week    Comment: 2 beers daily  . Drug use: No  . Sexual activity: Not on file  Other Topics Concern  . Not on file  Social History Narrative  . Not on file   The PMH, PSH, Social History, Family History, Medications, and allergies have been reviewed in [facility], and have been updated if  relevant.   Review of Systems  Constitutional: Negative.   HENT: Negative.   Eyes: Negative.   Respiratory: Negative.   Cardiovascular: Negative.   Gastrointestinal: Negative.   Endocrine: Negative.   Genitourinary: Negative.   Musculoskeletal: Negative.   Skin: Negative for rash.  Allergic/Immunologic: Negative.   Neurological: Negative.  Hematological: Negative.   Psychiatric/Behavioral: Negative.   All other systems reviewed and are negative.      Objective:    BP 122/66 (BP Location: Left Arm, Patient Position: Sitting, Cuff Size: Normal)   Pulse 71   Temp (!) 97.5 F (36.4 C) (Oral)   Ht 5\' 11"  (1.803 m)   Wt 181 lb (82.1 kg)   SpO2 97%   BMI 25.24 kg/m   Wt Readings from Last 3 Encounters:  05/21/17 181 lb (82.1 kg)  12/20/16 187 lb (84.8 kg)  12/17/16 188 lb (85.3 kg)   Physical Exam  Constitutional: He is oriented to person, place, and time and well-developed, well-nourished, and in no distress. No distress.  HENT:  Head: Normocephalic and atraumatic.  Eyes: Conjunctivae are normal.  Cardiovascular: Normal rate and regular rhythm.  Pulmonary/Chest: Effort normal and breath sounds normal.  Genitourinary:  Genitourinary Comments: Deferred- has urologist  Musculoskeletal: Normal range of motion. He exhibits no edema.  Neurological: He is alert and oriented to person, place, and time.  Skin: Skin is warm and dry. He is not diaphoretic.  Psychiatric: Mood, memory, affect and judgment normal.  Nursing note and vitals reviewed.        Assessment & Plan:   Benign prostatic hyperplasia with lower urinary tract symptoms, symptom details unspecified  Visit for well man health check  Essential thrombocytosis ([place])  Essential hypertension  Low testosterone No Follow-up on file.

## 2017-05-22 ENCOUNTER — Ambulatory Visit: Payer: BLUE CROSS/BLUE SHIELD | Admitting: Internal Medicine

## 2017-05-22 ENCOUNTER — Other Ambulatory Visit: Payer: BLUE CROSS/BLUE SHIELD

## 2017-05-22 ENCOUNTER — Encounter: Payer: BLUE CROSS/BLUE SHIELD | Admitting: Family Medicine

## 2017-05-26 ENCOUNTER — Other Ambulatory Visit: Payer: Self-pay | Admitting: Medical Oncology

## 2017-05-26 DIAGNOSIS — D473 Essential (hemorrhagic) thrombocythemia: Secondary | ICD-10-CM

## 2017-05-27 ENCOUNTER — Inpatient Hospital Stay: Payer: BLUE CROSS/BLUE SHIELD

## 2017-05-27 ENCOUNTER — Inpatient Hospital Stay: Payer: BLUE CROSS/BLUE SHIELD | Admitting: Internal Medicine

## 2017-05-27 ENCOUNTER — Other Ambulatory Visit: Payer: Self-pay | Admitting: Internal Medicine

## 2017-05-27 ENCOUNTER — Encounter: Payer: Self-pay | Admitting: Internal Medicine

## 2017-05-27 ENCOUNTER — Telehealth: Payer: Self-pay | Admitting: Internal Medicine

## 2017-05-27 ENCOUNTER — Inpatient Hospital Stay: Payer: BLUE CROSS/BLUE SHIELD | Attending: Internal Medicine

## 2017-05-27 VITALS — BP 143/82 | HR 72 | Temp 98.7°F | Resp 17 | Ht 71.0 in | Wt 179.3 lb

## 2017-05-27 DIAGNOSIS — D751 Secondary polycythemia: Secondary | ICD-10-CM

## 2017-05-27 DIAGNOSIS — I10 Essential (primary) hypertension: Secondary | ICD-10-CM

## 2017-05-27 DIAGNOSIS — D473 Essential (hemorrhagic) thrombocythemia: Secondary | ICD-10-CM

## 2017-05-27 DIAGNOSIS — E291 Testicular hypofunction: Secondary | ICD-10-CM | POA: Diagnosis not present

## 2017-05-27 LAB — CBC WITH DIFFERENTIAL (CANCER CENTER ONLY)
Basophils Absolute: 0.1 10*3/uL (ref 0.0–0.1)
Basophils Relative: 1 %
Eosinophils Absolute: 0.2 10*3/uL (ref 0.0–0.5)
Eosinophils Relative: 2 %
HEMATOCRIT: 56.7 % — AB (ref 38.4–49.9)
Hemoglobin: 18.9 g/dL — ABNORMAL HIGH (ref 13.0–17.1)
LYMPHS ABS: 1.1 10*3/uL (ref 0.9–3.3)
LYMPHS PCT: 17 %
MCH: 35.7 pg — AB (ref 27.2–33.4)
MCHC: 33.3 g/dL (ref 32.0–36.0)
MCV: 107.2 fL — AB (ref 79.3–98.0)
MONO ABS: 0.6 10*3/uL (ref 0.1–0.9)
MONOS PCT: 9 %
NEUTROS ABS: 4.5 10*3/uL (ref 1.5–6.5)
Neutrophils Relative %: 71 %
Platelet Count: 336 10*3/uL (ref 140–400)
RBC: 5.29 MIL/uL (ref 4.20–5.82)
RDW: 13 % (ref 11.0–14.6)
WBC Count: 6.4 10*3/uL (ref 4.0–10.3)

## 2017-05-27 LAB — CMP (CANCER CENTER ONLY)
ALT: 21 U/L (ref 0–55)
ANION GAP: 8 (ref 3–11)
AST: 21 U/L (ref 5–34)
Albumin: 4.1 g/dL (ref 3.5–5.0)
Alkaline Phosphatase: 77 U/L (ref 40–150)
BUN: 19 mg/dL (ref 7–26)
CHLORIDE: 103 mmol/L (ref 98–109)
CO2: 28 mmol/L (ref 22–29)
Calcium: 9.4 mg/dL (ref 8.4–10.4)
Creatinine: 1.19 mg/dL (ref 0.70–1.30)
GFR, Est AFR Am: >60 mL/min (ref >60)
GFR, Estimated: >60 mL/min (ref >60)
Glucose, Bld: 94 mg/dL (ref 70–140)
POTASSIUM: 4.6 mmol/L (ref 3.5–5.1)
SODIUM: 139 mmol/L (ref 136–145)
TOTAL PROTEIN: 6.5 g/dL (ref 6.4–8.3)
Total Bilirubin: 1.1 mg/dL (ref 0.2–1.2)

## 2017-05-27 NOTE — Telephone Encounter (Signed)
AVS/Calendar printed for patient per 3/5 los

## 2017-05-27 NOTE — Patient Instructions (Signed)

## 2017-05-27 NOTE — Progress Notes (Signed)
[facility] [phone]   [phone]  OFFICE PROGRESS NOTE  [address], [place] [address]  DIAGNOSIS:  1) Essential thrombocythemia diagnosed in 2006  2) androgen-induced polycythemia.  PRIOR THERAPY: None   CURRENT THERAPY:  1) Hydroxyurea 500 mg by mouth daily except Friday he is on 1000 mg 2) phlebotomy on as-needed basis.  INTERVAL HISTORY: Dennis Martin 62 y.o. male returns to the clinic today for follow-up visit accompanied by his wife.  The patient was last seen in 2017.  Has been on treatment with hydroxyurea and tolerating it well.  He is followed by urology and was started on testosterone 60 mg daily for fatigue and erectile dysfunction.  First dose was on 05/14/2016.  He has been complaining recently of increasing headache as well as flushing of his face.  Repeat CBC by his primary care physician showed elevated hemoglobin and hematocrit.  The patient was referred back to me for evaluation and recommendation regarding his condition.  He was tried on several medication in the past for the erectile dysfunction with no improvement until his treatment with testosterone.  He would like to continue on this treatment.  He denied having any chest pain but has occasional heaviness.  He has no shortness of breath, cough or hemoptysis.  He denied having any nausea, vomiting, diarrhea or constipation.  He is here today for evaluation and recommendation regarding his condition.   MEDICAL HISTORY: Past Medical History:  Diagnosis Date  . Anxiety    hx of - 10 years ago  . Arthritis    both arthritis, wrist, hands   . BPH (benign prostatic hyperplasia)   . DVT of leg (deep venous thrombosis) ([place]) 2001   post knee arthroscopy,also complicated by infection, PICC line used for long term antibiotics, was on coumadin x's 6 months  . Essential thrombocytosis ([place])   . Headache    relative to testosterone level, it had  been a problem, improved with clomid  . History of stress test 2008   done in [place] ,nuclear stress test- told that it was normal  . Hypertension   . Peripheral vascular disease ([place])   . Sleep apnea    did surgery for the problem  . Spleen enlarged   . Thrombocythemia ([place])    monitored by Dr. Earlie Server    ALLERGIES:  is allergic to keflex [cephalexin].  MEDICATIONS:  Current Outpatient Medications  Medication Sig Dispense Refill  . aspirin EC 81 MG tablet Take 81 mg by mouth 2 (two) times daily.    . calcium carbonate (TUMS EX) 750 MG chewable tablet Chew 1 tablet by mouth daily as needed for heartburn.    . desonide (DESOWEN) 0.05 % cream Apply 1 application topically 2 (two) times daily as needed (rash).    . folic acid (FOLVITE) 053 MCG tablet Take 400 mcg by mouth daily.    . hydrocortisone 2.5 % lotion Apply 1 application topically daily as needed (rash/itching).     . hydroxyurea (HYDREA) 500 MG capsule TAKE 1 CAPSULE DAILY EXCEPT TAKE 2 CAPSULES ON FRIDAYS 105 capsule 3  . ketoconazole (NIZORAL) 2 % cream Apply 1 application topically daily.    Marland Kitchen lisinopril (PRINIVIL,ZESTRIL) 10 MG tablet TAKE 1 TABLET DAILY 90 tablet 1  . nystatin (MYCOSTATIN/NYSTOP) powder APPLY TOPICALLY FOUR TIMES DAILY 15 g 9  . OVER THE COUNTER MEDICATION Vitafusion men's complete vitamin gummies; pt chews two gummies daily.    Marland Kitchen  tamsulosin (FLOMAX) 0.4 MG CAPS capsule Take 0.4 mg by mouth at bedtime.     . Testosterone (AXIRON) 30 MG/ACT SOLN 60 mg by Pump Prime route daily.    Marland Kitchen triamcinolone cream (KENALOG) 0.1 % Apply 1 application topically 2 (two) times daily as needed (skin irritation).      No current facility-administered medications for this visit.     SURGICAL HISTORY:  Past Surgical History:  Procedure Laterality Date  . EYE SURGERY     chalazion on both eyelids  . KNEE SURGERY Bilateral 2001  . NASAL SINUS SURGERY    . TONSILLECTOMY    . TOTAL KNEE ARTHROPLASTY Right  05/22/2015  . TOTAL KNEE ARTHROPLASTY Right 05/22/2015   Procedure: TOTAL KNEE ARTHROPLASTY;  Surgeon: Frederik Pear, MD;  Location: [facility];  Service: Orthopedics;  Laterality: Right;  . TOTAL KNEE ARTHROPLASTY Left 07/17/2015  . TOTAL KNEE ARTHROPLASTY Left 07/17/2015   Procedure: TOTAL KNEE ARTHROPLASTY;  Surgeon: Frederik Pear, MD;  Location: [facility];  Service: Orthopedics;  Laterality: Left;  . ULNAR NERVE TRANSPOSITION  2007   left    REVIEW OF SYSTEMS:  Constitutional: positive for fatigue Eyes: negative Ears, nose, mouth, throat, and face: negative Respiratory: positive for pleurisy/chest pain Cardiovascular: negative Gastrointestinal: negative Genitourinary:negative Integument/breast: negative Hematologic/lymphatic: negative Musculoskeletal:negative Neurological: positive for headaches Behavioral/Psych: negative Endocrine: negative Allergic/Immunologic: negative   PHYSICAL EXAMINATION: General appearance: alert, cooperative and no distress Head: Normocephalic, without obvious abnormality, atraumatic Neck: no adenopathy Lymph nodes: Cervical, supraclavicular, and axillary nodes normal. Resp: clear to auscultation bilaterally Back: symmetric, no curvature. ROM normal. No CVA tenderness. Cardio: regular rate and rhythm, S1, S2 normal, no murmur, click, rub or gallop GI: soft, non-tender; bowel sounds normal; no masses,  no organomegaly Extremities: extremities normal, atraumatic, no cyanosis or edema Neurologic: Alert and oriented X 3, normal strength and tone. Normal symmetric reflexes. Normal coordination and gait  ECOG PERFORMANCE STATUS: 1 - Symptomatic but completely ambulatory  Blood pressure (!) 143/82, pulse 72, temperature 98.7 F (37.1 C), temperature source Oral, resp. rate 17, height 5\' 11"  (1.803 m), weight 179 lb 4.8 oz (81.3 kg), SpO2 99 %.  LABORATORY DATA: Lab Results  Component Value Date   WBC 6.4 05/27/2017   HGB 18.7 Repeated and verified X2. (HH)  05/15/2017   HCT 56.7 (H) 05/27/2017   MCV 107.2 (H) 05/27/2017   PLT 336 05/27/2017      Chemistry      Component Value Date/Time   NA 141 05/15/2017 0806   NA 139 11/10/2013 1508   K 5.3 (H) 05/15/2017 0806   K 4.3 11/10/2013 1508   CL 103 05/15/2017 0806   CL 102 05/04/2012 1309   CO2 34 (H) 05/15/2017 0806   CO2 28 11/10/2013 1508   BUN 19 05/15/2017 0806   BUN 15.4 11/10/2013 1508   CREATININE 1.16 05/15/2017 0806   CREATININE 1.1 11/10/2013 1508      Component Value Date/Time   CALCIUM 9.8 05/15/2017 0806   CALCIUM 9.6 11/10/2013 1508   ALKPHOS 69 05/15/2017 0806   ALKPHOS 78 11/10/2013 1508   AST 18 05/15/2017 0806   AST 19 11/10/2013 1508   ALT 16 05/15/2017 0806   ALT 15 11/10/2013 1508   BILITOT 0.9 05/15/2017 0806   BILITOT 0.43 11/10/2013 1508       RADIOGRAPHIC STUDIES: No results found.  ASSESSMENT AND PLAN: This is a very pleasant 62 years old white male with essential thrombocythemia currently on treatment with hydroxyurea 500  mg by mouth daily except Friday where the patient takes 1000 mg. He is tolerating his treatment fairly well and there is no significant evidence for disease progression on his recent blood count. His platelets count today is down to 336,000. He presented today for evaluation of androgen-induced polycythemia. I had a lengthy discussion with the patient and his wife about his condition.  He was given the option of discussion with his urologist reducing the dose of testosterone to see if it would help with his polycythemia versus consideration of phlebotomy at regular basis to reduce his hematocrit close to 45%.  The patient will not be able to donate blood at the [facility] because of the essential thrombocythemia. He does not want to come and seen at regular basis but would like to continue phlebotomy as needed. I will arrange for him to have phlebotomy today with repeat CBC and phlebotomy if needed on June 06, 2017. I will arrange  for the patient to come back for follow-up visit in 3 months for reevaluation and repeat CBC as well as phlebotomy if needed. I would also discuss with his urologist consideration of reducing the dose of testosterone if possible. For his essential thrombocythemia, he will continue on hydroxyurea for now. He was advised to call immediately if he has any concerning symptoms in the interval. The patient had a lot of questions and I spent a good amount of money with him today answering his questions and addressing his issues. The patient voices understanding of current disease status and treatment options and is in agreement with the current care plan.  All questions were answered. The patient knows to call the clinic with any problems, questions or concerns. We can certainly see the patient much sooner if necessary. I spent 15 minutes counseling the patient face to face. The total time spent in the appointment was 25 minutes.  Disclaimer: This note was dictated with voice recognition software. Similar sounding words can inadvertently be transcribed and may be missed upon review.

## 2017-05-27 NOTE — Telephone Encounter (Signed)
Scheduled appt per 3/5 los - Patient to get an updated schedule in the treatment area.

## 2017-06-06 ENCOUNTER — Telehealth: Payer: Self-pay | Admitting: Medical Oncology

## 2017-06-06 ENCOUNTER — Inpatient Hospital Stay: Payer: BLUE CROSS/BLUE SHIELD

## 2017-06-06 DIAGNOSIS — D751 Secondary polycythemia: Secondary | ICD-10-CM | POA: Diagnosis not present

## 2017-06-06 DIAGNOSIS — D473 Essential (hemorrhagic) thrombocythemia: Secondary | ICD-10-CM

## 2017-06-06 LAB — CBC WITH DIFFERENTIAL (CANCER CENTER ONLY)
Basophils Absolute: 0 10*3/uL (ref 0.0–0.1)
Basophils Relative: 1 %
Eosinophils Absolute: 0.1 10*3/uL (ref 0.0–0.5)
Eosinophils Relative: 2 %
HCT: 51.7 % — ABNORMAL HIGH (ref 38.4–49.9)
Hemoglobin: 17.4 g/dL — ABNORMAL HIGH (ref 13.0–17.1)
LYMPHS ABS: 0.7 10*3/uL — AB (ref 0.9–3.3)
LYMPHS PCT: 10 %
MCH: 35.9 pg — AB (ref 27.2–33.4)
MCHC: 33.7 g/dL (ref 32.0–36.0)
MCV: 106.6 fL — AB (ref 79.3–98.0)
MONO ABS: 0.8 10*3/uL (ref 0.1–0.9)
MONOS PCT: 12 %
NEUTROS ABS: 4.9 10*3/uL (ref 1.5–6.5)
Neutrophils Relative %: 75 %
Platelet Count: 359 10*3/uL (ref 140–400)
RBC: 4.85 MIL/uL (ref 4.20–5.82)
RDW: 12.9 % (ref 11.0–14.6)
WBC Count: 6.6 10*3/uL (ref 4.0–10.3)

## 2017-06-06 NOTE — Patient Instructions (Signed)

## 2017-06-06 NOTE — Telephone Encounter (Signed)
Phlebotomy done.

## 2017-06-06 NOTE — Progress Notes (Signed)
Dennis Martin presents today for phlebotomy per MD orders. Phlebotomy procedure started at 0910 and ended at 0923. 530 cc removed. Patient tolerated procedure well. IV needle removed intact.

## 2017-06-10 ENCOUNTER — Telehealth: Payer: Self-pay | Admitting: Internal Medicine

## 2017-06-10 NOTE — Telephone Encounter (Signed)
Scheduled appt per 3/15 sch message - patient is aware of appt date and time.

## 2017-06-19 ENCOUNTER — Other Ambulatory Visit: Payer: Self-pay | Admitting: Medical Oncology

## 2017-06-19 DIAGNOSIS — D473 Essential (hemorrhagic) thrombocythemia: Secondary | ICD-10-CM

## 2017-06-20 ENCOUNTER — Inpatient Hospital Stay: Payer: BLUE CROSS/BLUE SHIELD

## 2017-06-20 DIAGNOSIS — D751 Secondary polycythemia: Secondary | ICD-10-CM

## 2017-06-20 DIAGNOSIS — D473 Essential (hemorrhagic) thrombocythemia: Secondary | ICD-10-CM

## 2017-06-20 LAB — CBC WITH DIFFERENTIAL (CANCER CENTER ONLY)
Basophils Absolute: 0 10*3/uL (ref 0.0–0.1)
Basophils Relative: 1 %
EOS ABS: 0.1 10*3/uL (ref 0.0–0.5)
Eosinophils Relative: 2 %
HEMATOCRIT: 48.5 % (ref 38.4–49.9)
HEMOGLOBIN: 16.2 g/dL (ref 13.0–17.1)
LYMPHS ABS: 0.6 10*3/uL — AB (ref 0.9–3.3)
Lymphocytes Relative: 17 %
MCH: 34.3 pg — AB (ref 27.2–33.4)
MCHC: 33.5 g/dL (ref 32.0–36.0)
MCV: 102.3 fL — AB (ref 79.3–98.0)
MONOS PCT: 19 %
Monocytes Absolute: 0.7 10*3/uL (ref 0.1–0.9)
NEUTROS PCT: 61 %
Neutro Abs: 2.3 10*3/uL (ref 1.5–6.5)
Platelet Count: 301 10*3/uL (ref 140–400)
RBC: 4.74 MIL/uL (ref 4.20–5.82)
RDW: 12.7 % (ref 11.0–14.6)
WBC Count: 3.7 10*3/uL — ABNORMAL LOW (ref 4.0–10.3)

## 2017-06-20 LAB — CMP (CANCER CENTER ONLY)
ALK PHOS: 73 U/L (ref 40–150)
ALT: 19 U/L (ref 0–55)
ANION GAP: 8 (ref 3–11)
AST: 24 U/L (ref 5–34)
Albumin: 3.8 g/dL (ref 3.5–5.0)
BILIRUBIN TOTAL: 0.5 mg/dL (ref 0.2–1.2)
BUN: 17 mg/dL (ref 7–26)
CALCIUM: 9.1 mg/dL (ref 8.4–10.4)
CO2: 29 mmol/L (ref 22–29)
Chloride: 102 mmol/L (ref 98–109)
Creatinine: 1.1 mg/dL (ref 0.70–1.30)
Glucose, Bld: 87 mg/dL (ref 70–140)
Potassium: 4 mmol/L (ref 3.5–5.1)
Sodium: 139 mmol/L (ref 136–145)
TOTAL PROTEIN: 6.1 g/dL — AB (ref 6.4–8.3)

## 2017-06-24 DIAGNOSIS — E291 Testicular hypofunction: Secondary | ICD-10-CM | POA: Diagnosis not present

## 2017-06-24 DIAGNOSIS — N401 Enlarged prostate with lower urinary tract symptoms: Secondary | ICD-10-CM | POA: Diagnosis not present

## 2017-06-24 LAB — PSA: PSA: 0.64

## 2017-07-01 DIAGNOSIS — E291 Testicular hypofunction: Secondary | ICD-10-CM | POA: Diagnosis not present

## 2017-07-03 ENCOUNTER — Other Ambulatory Visit: Payer: Self-pay | Admitting: Family Medicine

## 2017-07-09 DIAGNOSIS — G4733 Obstructive sleep apnea (adult) (pediatric): Secondary | ICD-10-CM | POA: Diagnosis not present

## 2017-07-14 ENCOUNTER — Encounter: Payer: Self-pay | Admitting: Family Medicine

## 2017-07-14 NOTE — Progress Notes (Signed)
[facility]/thx dmf

## 2017-07-15 ENCOUNTER — Ambulatory Visit (INDEPENDENT_AMBULATORY_CARE_PROVIDER_SITE_OTHER): Payer: BLUE CROSS/BLUE SHIELD

## 2017-07-15 DIAGNOSIS — Z23 Encounter for immunization: Secondary | ICD-10-CM | POA: Diagnosis not present

## 2017-07-15 NOTE — Progress Notes (Signed)
Patient came in for his second Shingrix vaccine. Patient received the vaccine in his left arm and tolerated the injection well. Patient had no reactions to his first vaccine.

## 2017-07-16 DIAGNOSIS — M67911 Unspecified disorder of synovium and tendon, right shoulder: Secondary | ICD-10-CM | POA: Diagnosis not present

## 2017-07-17 DIAGNOSIS — M7541 Impingement syndrome of right shoulder: Secondary | ICD-10-CM | POA: Diagnosis not present

## 2017-07-17 DIAGNOSIS — M25511 Pain in right shoulder: Secondary | ICD-10-CM | POA: Diagnosis not present

## 2017-07-25 DIAGNOSIS — M25511 Pain in right shoulder: Secondary | ICD-10-CM | POA: Diagnosis not present

## 2017-07-25 DIAGNOSIS — M7541 Impingement syndrome of right shoulder: Secondary | ICD-10-CM | POA: Diagnosis not present

## 2017-08-01 DIAGNOSIS — M7541 Impingement syndrome of right shoulder: Secondary | ICD-10-CM | POA: Diagnosis not present

## 2017-08-01 DIAGNOSIS — M25511 Pain in right shoulder: Secondary | ICD-10-CM | POA: Diagnosis not present

## 2017-08-08 DIAGNOSIS — M25511 Pain in right shoulder: Secondary | ICD-10-CM | POA: Diagnosis not present

## 2017-08-08 DIAGNOSIS — M7541 Impingement syndrome of right shoulder: Secondary | ICD-10-CM | POA: Diagnosis not present

## 2017-08-12 ENCOUNTER — Telehealth: Payer: Self-pay | Admitting: Internal Medicine

## 2017-08-12 NOTE — Telephone Encounter (Signed)
Spoke w/ pt regarding schedule change per MM from 6/4 to 6/3.  Arrival at 10:45.  Pt my chart active.

## 2017-08-13 DIAGNOSIS — M67911 Unspecified disorder of synovium and tendon, right shoulder: Secondary | ICD-10-CM | POA: Diagnosis not present

## 2017-08-15 DIAGNOSIS — M7541 Impingement syndrome of right shoulder: Secondary | ICD-10-CM | POA: Diagnosis not present

## 2017-08-15 DIAGNOSIS — M25511 Pain in right shoulder: Secondary | ICD-10-CM | POA: Diagnosis not present

## 2017-08-21 DIAGNOSIS — M25511 Pain in right shoulder: Secondary | ICD-10-CM | POA: Diagnosis not present

## 2017-08-25 ENCOUNTER — Inpatient Hospital Stay: Payer: BLUE CROSS/BLUE SHIELD | Admitting: Internal Medicine

## 2017-08-25 ENCOUNTER — Encounter: Payer: Self-pay | Admitting: Internal Medicine

## 2017-08-25 ENCOUNTER — Inpatient Hospital Stay: Payer: BLUE CROSS/BLUE SHIELD

## 2017-08-25 ENCOUNTER — Telehealth: Payer: Self-pay | Admitting: Internal Medicine

## 2017-08-25 ENCOUNTER — Other Ambulatory Visit: Payer: Self-pay | Admitting: Medical Oncology

## 2017-08-25 ENCOUNTER — Inpatient Hospital Stay: Payer: BLUE CROSS/BLUE SHIELD | Attending: Internal Medicine

## 2017-08-25 VITALS — BP 143/67 | HR 61 | Temp 97.7°F | Resp 18 | Ht 71.0 in | Wt 183.2 lb

## 2017-08-25 DIAGNOSIS — D751 Secondary polycythemia: Secondary | ICD-10-CM | POA: Diagnosis not present

## 2017-08-25 DIAGNOSIS — R51 Headache: Secondary | ICD-10-CM | POA: Insufficient documentation

## 2017-08-25 DIAGNOSIS — R5383 Other fatigue: Secondary | ICD-10-CM | POA: Insufficient documentation

## 2017-08-25 DIAGNOSIS — R718 Other abnormality of red blood cells: Secondary | ICD-10-CM | POA: Diagnosis not present

## 2017-08-25 DIAGNOSIS — D473 Essential (hemorrhagic) thrombocythemia: Secondary | ICD-10-CM

## 2017-08-25 LAB — CMP (CANCER CENTER ONLY)
ALBUMIN: 4 g/dL (ref 3.5–5.0)
ALK PHOS: 79 U/L (ref 40–150)
ALT: 20 U/L (ref 0–55)
AST: 31 U/L (ref 5–34)
Anion gap: 7 (ref 3–11)
BUN: 17 mg/dL (ref 7–26)
CALCIUM: 8.7 mg/dL (ref 8.4–10.4)
CO2: 28 mmol/L (ref 22–29)
CREATININE: 1.12 mg/dL (ref 0.70–1.30)
Chloride: 103 mmol/L (ref 98–109)
GFR, Est AFR Am: >60 mL/min (ref >60)
GFR, Estimated: >60 mL/min (ref >60)
GLUCOSE: 80 mg/dL (ref 70–140)
Potassium: 4.3 mmol/L (ref 3.5–5.1)
SODIUM: 138 mmol/L (ref 136–145)
Total Bilirubin: 0.7 mg/dL (ref 0.2–1.2)
Total Protein: 6.1 g/dL — ABNORMAL LOW (ref 6.4–8.3)

## 2017-08-25 LAB — CBC WITH DIFFERENTIAL (CANCER CENTER ONLY)
BASOS PCT: 1 %
Basophils Absolute: 0.1 10*3/uL (ref 0.0–0.1)
EOS ABS: 0.1 10*3/uL (ref 0.0–0.5)
Eosinophils Relative: 1 %
HCT: 50.2 % — ABNORMAL HIGH (ref 38.4–49.9)
Hemoglobin: 16.2 g/dL (ref 13.0–17.1)
Lymphocytes Relative: 13 %
Lymphs Abs: 1 10*3/uL (ref 0.9–3.3)
MCH: 32.7 pg (ref 27.2–33.4)
MCHC: 32.3 g/dL (ref 32.0–36.0)
MCV: 101.4 fL — ABNORMAL HIGH (ref 79.3–98.0)
MONO ABS: 0.8 10*3/uL (ref 0.1–0.9)
MONOS PCT: 10 %
Neutro Abs: 6.1 10*3/uL (ref 1.5–6.5)
Neutrophils Relative %: 75 %
Platelet Count: 348 10*3/uL (ref 140–400)
RBC: 4.95 MIL/uL (ref 4.20–5.82)
RDW: 14 % (ref 11.0–14.6)
WBC Count: 8.1 10*3/uL (ref 4.0–10.3)

## 2017-08-25 NOTE — Progress Notes (Signed)
[facility] [phone]   [phone]  OFFICE PROGRESS NOTE  [place], [place] [address]  DIAGNOSIS:  1) Essential thrombocythemia diagnosed in 2006  2) androgen-induced polycythemia.  PRIOR THERAPY: None   CURRENT THERAPY:  1) Hydroxyurea 500 mg by mouth daily except Friday he is on 1000 mg 2) phlebotomy on as-needed basis.  INTERVAL HISTORY: Dennis Martin 62 y.o. male returns to the clinic today for follow-up visit accompanied by his wife.  The patient is feeling fine today with no specific complaints except for intermittent headache and fatigue.  He denied having any chest pain, shortness of breath, cough or hemoptysis.  He has no nausea, vomiting, diarrhea or constipation.  He has no fever or chills.  He gained around 7 pounds since his last visit.  He still on androgen therapy.  He is here today for evaluation with repeat CBC and complaints metabolic panel.  MEDICAL HISTORY: Past Medical History:  Diagnosis Date  . Anxiety    hx of - 10 years ago  . Arthritis    both arthritis, wrist, hands   . BPH (benign prostatic hyperplasia)   . DVT of leg (deep venous thrombosis) ([facility]) 2001   post knee arthroscopy,also complicated by infection, PICC line used for long term antibiotics, was on coumadin x's 6 months  . Essential thrombocytosis ([facility])   . Headache    relative to testosterone level, it had been a problem, improved with clomid  . History of stress test 2008   done in [place] ,nuclear stress test- told that it was normal  . Hypertension   . Peripheral vascular disease ([facility])   . Sleep apnea    did surgery for the problem  . Spleen enlarged   . Thrombocythemia ([facility])    monitored by Dr. Earlie Server    ALLERGIES:  is allergic to keflex [cephalexin].  MEDICATIONS:  Current Outpatient Medications  Medication Sig Dispense Refill  . aspirin EC 81 MG tablet Take 81 mg by mouth 2 (two) times  daily.    . calcium carbonate (TUMS EX) 750 MG chewable tablet Chew 1 tablet by mouth daily as needed for heartburn.    . desonide (DESOWEN) 0.05 % cream Apply 1 application topically 2 (two) times daily as needed (rash).    . folic acid (FOLVITE) 867 MCG tablet Take 400 mcg by mouth daily.    . hydrocortisone 2.5 % lotion Apply 1 application topically daily as needed (rash/itching).     . hydroxyurea (HYDREA) 500 MG capsule TAKE 1 CAPSULE DAILY EXCEPT TAKE 2 CAPSULES ON FRIDAYS 105 capsule 1  . ketoconazole (NIZORAL) 2 % cream Apply 1 application topically daily.    Marland Kitchen lisinopril (PRINIVIL,ZESTRIL) 10 MG tablet TAKE 1 TABLET DAILY 90 tablet 1  . nystatin (MYCOSTATIN/NYSTOP) powder APPLY TOPICALLY FOUR TIMES DAILY 15 g 9  . OVER THE COUNTER MEDICATION Vitafusion men's complete vitamin gummies; pt chews two gummies daily.    . tamsulosin (FLOMAX) 0.4 MG CAPS capsule Take 0.4 mg by mouth at bedtime.     . Testosterone (AXIRON) 30 MG/ACT SOLN 60 mg by Pump Prime route daily.    Marland Kitchen triamcinolone cream (KENALOG) 0.1 % Apply 1 application topically 2 (two) times daily as needed (skin irritation).      No current facility-administered medications for this visit.     SURGICAL HISTORY:  Past Surgical History:  Procedure Laterality Date  . EYE SURGERY  chalazion on both eyelids  . KNEE SURGERY Bilateral 2001  . NASAL SINUS SURGERY    . TONSILLECTOMY    . TOTAL KNEE ARTHROPLASTY Right 05/22/2015  . TOTAL KNEE ARTHROPLASTY Right 05/22/2015   Procedure: TOTAL KNEE ARTHROPLASTY;  Surgeon: Frederik Pear, MD;  Location: [facility];  Service: Orthopedics;  Laterality: Right;  . TOTAL KNEE ARTHROPLASTY Left 07/17/2015  . TOTAL KNEE ARTHROPLASTY Left 07/17/2015   Procedure: TOTAL KNEE ARTHROPLASTY;  Surgeon: Frederik Pear, MD;  Location: [facility];  Service: Orthopedics;  Laterality: Left;  . ULNAR NERVE TRANSPOSITION  2007   left    REVIEW OF SYSTEMS:  A comprehensive review of systems was negative except for:  Constitutional: positive for fatigue Neurological: positive for headaches   PHYSICAL EXAMINATION: General appearance: alert, cooperative, fatigued and no distress Head: Normocephalic, without obvious abnormality, atraumatic Neck: no adenopathy Lymph nodes: Cervical, supraclavicular, and axillary nodes normal. Resp: clear to auscultation bilaterally Back: symmetric, no curvature. ROM normal. No CVA tenderness. Cardio: regular rate and rhythm, S1, S2 normal, no murmur, click, rub or gallop GI: soft, non-tender; bowel sounds normal; no masses,  no organomegaly Extremities: extremities normal, atraumatic, no cyanosis or edema  ECOG PERFORMANCE STATUS: 1 - Symptomatic but completely ambulatory  Blood pressure (!) 143/67, pulse 61, temperature 97.7 F (36.5 C), temperature source Oral, resp. rate 18, height 5\' 11"  (1.803 m), weight 183 lb 3.2 oz (83.1 kg), SpO2 98 %.  LABORATORY DATA: Lab Results  Component Value Date   WBC 8.1 08/25/2017   HGB 16.2 08/25/2017   HCT 50.2 (H) 08/25/2017   MCV 101.4 (H) 08/25/2017   PLT 348 08/25/2017      Chemistry      Component Value Date/Time   NA 138 08/25/2017 1055   NA 139 11/10/2013 1508   K 4.3 08/25/2017 1055   K 4.3 11/10/2013 1508   CL 103 08/25/2017 1055   CL 102 05/04/2012 1309   CO2 28 08/25/2017 1055   CO2 28 11/10/2013 1508   BUN 17 08/25/2017 1055   BUN 15.4 11/10/2013 1508   CREATININE 1.12 08/25/2017 1055   CREATININE 1.1 11/10/2013 1508      Component Value Date/Time   CALCIUM 8.7 08/25/2017 1055   CALCIUM 9.6 11/10/2013 1508   ALKPHOS 79 08/25/2017 1055   ALKPHOS 78 11/10/2013 1508   AST 31 08/25/2017 1055   AST 19 11/10/2013 1508   ALT 20 08/25/2017 1055   ALT 15 11/10/2013 1508   BILITOT 0.7 08/25/2017 1055   BILITOT 0.43 11/10/2013 1508       RADIOGRAPHIC STUDIES: No results found.  ASSESSMENT AND PLAN: This is a very pleasant 62 years old white male with essential thrombocythemia currently on treatment  with hydroxyurea 500 mg by mouth daily except Friday where the patient takes 1000 mg. He is tolerating his treatment fairly well and there is no significant evidence for disease progression on his recent blood count. His platelets count today is down to 348,000. The patient also has androgen induced polycythemia.  He is currently on phlebotomy on as-needed basis.  His hematocrit is elevated today.  I recommended for the patient to proceed with phlebotomy today as scheduled. I will see him back for follow-up visit in 3 months for evaluation with repeat CBC and comprehensive metabolic panel. For the essential thrombocythemia, he will continue his current treatment with hydroxyurea. The patient had a lot of questions and I spent a good amount of money with him today answering his questions  and addressing his issues. The patient voices understanding of current disease status and treatment options and is in agreement with the current care plan.  All questions were answered. The patient knows to call the clinic with any problems, questions or concerns. We can certainly see the patient much sooner if necessary.   Disclaimer: This note was dictated with voice recognition software. Similar sounding words can inadvertently be transcribed and may be missed upon review.

## 2017-08-25 NOTE — Patient Instructions (Signed)

## 2017-08-25 NOTE — Telephone Encounter (Signed)
Scheduled appt per 6/3 los - Gave patient AVS and calender  per los.  

## 2017-08-25 NOTE — Progress Notes (Signed)
Phlebotomy performed in Left lateral aspect of antecubital easily with 16G needle.  Blood obtained with very thick viscosity.   435 g of blood obtained and wasted.  AVS post phlebotomy given to pt ; instructed pt to decrease amount of red meat consumption, and to increase water intake as tolerated.  Pt understood to call office if headache, increased fatigue occur prior to next return appt in Sept. Pt was stable at discharge , ambulated with steady gait with wife.

## 2017-08-26 ENCOUNTER — Ambulatory Visit: Payer: BLUE CROSS/BLUE SHIELD | Admitting: Internal Medicine

## 2017-08-26 ENCOUNTER — Other Ambulatory Visit: Payer: BLUE CROSS/BLUE SHIELD

## 2017-08-27 ENCOUNTER — Other Ambulatory Visit: Payer: BLUE CROSS/BLUE SHIELD

## 2017-08-27 ENCOUNTER — Ambulatory Visit: Payer: BLUE CROSS/BLUE SHIELD | Admitting: Internal Medicine

## 2017-09-03 DIAGNOSIS — M24811 Other specific joint derangements of right shoulder, not elsewhere classified: Secondary | ICD-10-CM | POA: Diagnosis not present

## 2017-09-03 DIAGNOSIS — M67911 Unspecified disorder of synovium and tendon, right shoulder: Secondary | ICD-10-CM | POA: Diagnosis not present

## 2017-09-15 ENCOUNTER — Telehealth: Payer: Self-pay | Admitting: Internal Medicine

## 2017-09-15 ENCOUNTER — Other Ambulatory Visit: Payer: Self-pay | Admitting: Internal Medicine

## 2017-09-15 ENCOUNTER — Inpatient Hospital Stay: Payer: BLUE CROSS/BLUE SHIELD

## 2017-09-15 DIAGNOSIS — R51 Headache: Secondary | ICD-10-CM | POA: Diagnosis not present

## 2017-09-15 DIAGNOSIS — D751 Secondary polycythemia: Secondary | ICD-10-CM

## 2017-09-15 DIAGNOSIS — D473 Essential (hemorrhagic) thrombocythemia: Secondary | ICD-10-CM | POA: Diagnosis not present

## 2017-09-15 DIAGNOSIS — R718 Other abnormality of red blood cells: Secondary | ICD-10-CM | POA: Diagnosis not present

## 2017-09-15 DIAGNOSIS — R5383 Other fatigue: Secondary | ICD-10-CM | POA: Diagnosis not present

## 2017-09-15 LAB — CBC WITH DIFFERENTIAL (CANCER CENTER ONLY)
BASOS ABS: 0 10*3/uL (ref 0.0–0.1)
Basophils Relative: 1 %
Eosinophils Absolute: 0.1 10*3/uL (ref 0.0–0.5)
Eosinophils Relative: 2 %
HCT: 50.5 % — ABNORMAL HIGH (ref 38.4–49.9)
HEMOGLOBIN: 16.6 g/dL (ref 13.0–17.1)
LYMPHS PCT: 12 %
Lymphs Abs: 0.9 10*3/uL (ref 0.9–3.3)
MCH: 32.2 pg (ref 27.2–33.4)
MCHC: 32.9 g/dL (ref 32.0–36.0)
MCV: 98 fL (ref 79.3–98.0)
Monocytes Absolute: 0.6 10*3/uL (ref 0.1–0.9)
Monocytes Relative: 8 %
NEUTROS ABS: 6.1 10*3/uL (ref 1.5–6.5)
NEUTROS PCT: 77 %
PLATELETS: 366 10*3/uL (ref 140–400)
RBC: 5.15 MIL/uL (ref 4.20–5.82)
RDW: 14.7 % — ABNORMAL HIGH (ref 11.0–14.6)
WBC Count: 7.9 10*3/uL (ref 4.0–10.3)

## 2017-09-15 LAB — CMP (CANCER CENTER ONLY)
ALT: 11 U/L (ref 0–55)
ANION GAP: 10 (ref 3–11)
AST: 21 U/L (ref 5–34)
Albumin: 4.1 g/dL (ref 3.5–5.0)
Alkaline Phosphatase: 85 U/L (ref 40–150)
BILIRUBIN TOTAL: 0.5 mg/dL (ref 0.2–1.2)
BUN: 18 mg/dL (ref 7–26)
CHLORIDE: 102 mmol/L (ref 98–109)
CO2: 27 mmol/L (ref 22–29)
Calcium: 9.2 mg/dL (ref 8.4–10.4)
Creatinine: 1.17 mg/dL (ref 0.70–1.30)
GFR, Est AFR Am: >60 mL/min (ref >60)
GFR, Estimated: >60 mL/min (ref >60)
Glucose, Bld: 155 mg/dL — ABNORMAL HIGH (ref 70–140)
POTASSIUM: 4.1 mmol/L (ref 3.5–5.1)
Sodium: 139 mmol/L (ref 136–145)
TOTAL PROTEIN: 6.4 g/dL (ref 6.4–8.3)

## 2017-09-15 NOTE — Progress Notes (Signed)
Phlebotomy performed with kit to right AC with 590 grams withdrawn over 6 minutes. Patient tolerated well, food and drink provided, and VSS.

## 2017-09-29 ENCOUNTER — Telehealth: Payer: Self-pay | Admitting: Internal Medicine

## 2017-09-29 ENCOUNTER — Telehealth: Payer: Self-pay | Admitting: Medical Oncology

## 2017-09-29 DIAGNOSIS — D751 Secondary polycythemia: Secondary | ICD-10-CM

## 2017-09-29 NOTE — Telephone Encounter (Signed)
Called pt re appts that were added per 7/8 sch msg

## 2017-09-29 NOTE — Telephone Encounter (Signed)
Headache, fatigue, burning in feet. Requests CBC to see if he needs phlebotomy . Shoulder surgery  Friday . Schedule request sent.

## 2017-09-30 ENCOUNTER — Inpatient Hospital Stay: Payer: BLUE CROSS/BLUE SHIELD

## 2017-09-30 ENCOUNTER — Inpatient Hospital Stay: Payer: BLUE CROSS/BLUE SHIELD | Attending: Internal Medicine

## 2017-09-30 DIAGNOSIS — D751 Secondary polycythemia: Secondary | ICD-10-CM | POA: Insufficient documentation

## 2017-09-30 LAB — CBC WITH DIFFERENTIAL (CANCER CENTER ONLY)
Basophils Absolute: 0.1 10*3/uL (ref 0.0–0.1)
Basophils Relative: 1 %
EOS ABS: 0.1 10*3/uL (ref 0.0–0.5)
Eosinophils Relative: 1 %
HEMATOCRIT: 46.9 % (ref 38.4–49.9)
HEMOGLOBIN: 15.4 g/dL (ref 13.0–17.1)
LYMPHS ABS: 0.8 10*3/uL — AB (ref 0.9–3.3)
Lymphocytes Relative: 13 %
MCH: 31.5 pg (ref 27.2–33.4)
MCHC: 32.8 g/dL (ref 32.0–36.0)
MCV: 96 fL (ref 79.3–98.0)
MONOS PCT: 10 %
Monocytes Absolute: 0.6 10*3/uL (ref 0.1–0.9)
NEUTROS ABS: 5 10*3/uL (ref 1.5–6.5)
NEUTROS PCT: 75 %
Platelet Count: 332 10*3/uL (ref 140–400)
RBC: 4.89 MIL/uL (ref 4.20–5.82)
RDW: 14.4 % (ref 11.0–14.6)
WBC: 6.6 10*3/uL (ref 4.0–10.3)

## 2017-09-30 NOTE — Progress Notes (Signed)
Dennis Martin presents today for phlebotomy per MD orders. RAC accessed with 16G needle. Phlebotomy procedure started at 09:25 and ended at 09:30. 522 grams removed. Patient tolerated procedure well. IV needle removed intact. Snack and beverage provided, and patient observed for 30 minutes after procedure without any incident.

## 2017-09-30 NOTE — Patient Instructions (Signed)

## 2017-10-03 DIAGNOSIS — G8918 Other acute postprocedural pain: Secondary | ICD-10-CM | POA: Diagnosis not present

## 2017-10-03 DIAGNOSIS — M19011 Primary osteoarthritis, right shoulder: Secondary | ICD-10-CM | POA: Diagnosis not present

## 2017-10-03 DIAGNOSIS — M7541 Impingement syndrome of right shoulder: Secondary | ICD-10-CM | POA: Diagnosis not present

## 2017-10-03 DIAGNOSIS — M24811 Other specific joint derangements of right shoulder, not elsewhere classified: Secondary | ICD-10-CM | POA: Diagnosis not present

## 2017-10-03 DIAGNOSIS — M24111 Other articular cartilage disorders, right shoulder: Secondary | ICD-10-CM | POA: Diagnosis not present

## 2017-10-11 ENCOUNTER — Other Ambulatory Visit: Payer: Self-pay | Admitting: Family Medicine

## 2017-10-15 DIAGNOSIS — M19011 Primary osteoarthritis, right shoulder: Secondary | ICD-10-CM | POA: Diagnosis not present

## 2017-10-27 ENCOUNTER — Telehealth: Payer: Self-pay | Admitting: *Deleted

## 2017-10-27 NOTE — Telephone Encounter (Signed)
Received call from pt stating that he is yawning, fatigued & has h/a & was told to call for an appt for lab/phleb when he had these symptoms.  Message to Dr Olene Floss

## 2017-10-28 ENCOUNTER — Telehealth: Payer: Self-pay | Admitting: Internal Medicine

## 2017-10-28 NOTE — Telephone Encounter (Signed)
Spoke with patient re lab/phleb 8/8. Scheduled per 8/6 schedule message.

## 2017-10-29 ENCOUNTER — Other Ambulatory Visit: Payer: Self-pay | Admitting: Medical Oncology

## 2017-10-29 DIAGNOSIS — D751 Secondary polycythemia: Secondary | ICD-10-CM

## 2017-10-30 ENCOUNTER — Inpatient Hospital Stay: Payer: BLUE CROSS/BLUE SHIELD | Attending: Internal Medicine

## 2017-10-30 ENCOUNTER — Inpatient Hospital Stay: Payer: BLUE CROSS/BLUE SHIELD

## 2017-10-30 DIAGNOSIS — D751 Secondary polycythemia: Secondary | ICD-10-CM | POA: Diagnosis not present

## 2017-10-30 LAB — CBC WITH DIFFERENTIAL (CANCER CENTER ONLY)
BASOS ABS: 0.1 10*3/uL (ref 0.0–0.1)
BASOS PCT: 1 %
EOS ABS: 0.1 10*3/uL (ref 0.0–0.5)
EOS PCT: 2 %
HCT: 46.9 % (ref 38.4–49.9)
HEMOGLOBIN: 14.8 g/dL (ref 13.0–17.1)
Lymphocytes Relative: 17 %
Lymphs Abs: 1.1 10*3/uL (ref 0.9–3.3)
MCH: 30 pg (ref 27.2–33.4)
MCHC: 31.6 g/dL — ABNORMAL LOW (ref 32.0–36.0)
MCV: 94.9 fL (ref 79.3–98.0)
Monocytes Absolute: 0.7 10*3/uL (ref 0.1–0.9)
Monocytes Relative: 11 %
NEUTROS PCT: 69 %
Neutro Abs: 4.6 10*3/uL (ref 1.5–6.5)
PLATELETS: 305 10*3/uL (ref 140–400)
RBC: 4.94 MIL/uL (ref 4.20–5.82)
RDW: 13.6 % (ref 11.0–14.6)
WBC Count: 6.6 10*3/uL (ref 4.0–10.3)

## 2017-10-30 NOTE — Progress Notes (Signed)
[name] presents today for phlebotomy per MD orders. RAC accessed with 16G phlebotomy kit. Phlebotomy procedure started at 11:24 and ended at 11:31. 508 grams removed. IV needle removed intact. Patient tolerated procedure well. VSS and patient observed for 30 minutes after procedure without any incident.

## 2017-10-30 NOTE — Patient Instructions (Signed)

## 2017-10-31 DIAGNOSIS — G4733 Obstructive sleep apnea (adult) (pediatric): Secondary | ICD-10-CM | POA: Diagnosis not present

## 2017-11-02 ENCOUNTER — Other Ambulatory Visit: Payer: Self-pay | Admitting: Family Medicine

## 2017-11-14 ENCOUNTER — Telehealth: Payer: Self-pay | Admitting: Internal Medicine

## 2017-11-14 NOTE — Telephone Encounter (Signed)
MM PAL 9/3. Moved appointments to 9/6 per MM. Spoke with patient he is aware.

## 2017-11-14 NOTE — Telephone Encounter (Signed)
Received call from [name] returning nurse call about appointment chages.  "I do not want to come in September 3rd and 6th,  I'd like all appointments on the same day." Provided appointment information and call clarification.  Dennis Martin confirmed he will arrive September 6th, 2019.  Denies further needs or questions at this time.

## 2017-11-17 ENCOUNTER — Telehealth: Payer: Self-pay | Admitting: Internal Medicine

## 2017-11-17 NOTE — Telephone Encounter (Signed)
Patient called to r/s his 9/6 appt/ ok per [facility] to moved to 9/10 @ 1:00 (66mins) spot per 8/26 staff message

## 2017-11-25 ENCOUNTER — Ambulatory Visit: Payer: BLUE CROSS/BLUE SHIELD | Admitting: Internal Medicine

## 2017-11-25 ENCOUNTER — Other Ambulatory Visit: Payer: BLUE CROSS/BLUE SHIELD

## 2017-11-28 ENCOUNTER — Other Ambulatory Visit: Payer: BLUE CROSS/BLUE SHIELD

## 2017-11-28 ENCOUNTER — Ambulatory Visit: Payer: BLUE CROSS/BLUE SHIELD | Admitting: Oncology

## 2017-12-01 ENCOUNTER — Other Ambulatory Visit: Payer: Self-pay | Admitting: Oncology

## 2017-12-01 DIAGNOSIS — D473 Essential (hemorrhagic) thrombocythemia: Secondary | ICD-10-CM

## 2017-12-02 ENCOUNTER — Inpatient Hospital Stay: Payer: BLUE CROSS/BLUE SHIELD

## 2017-12-02 ENCOUNTER — Inpatient Hospital Stay: Payer: BLUE CROSS/BLUE SHIELD | Attending: Internal Medicine

## 2017-12-02 ENCOUNTER — Inpatient Hospital Stay (HOSPITAL_BASED_OUTPATIENT_CLINIC_OR_DEPARTMENT_OTHER): Payer: BLUE CROSS/BLUE SHIELD | Admitting: Oncology

## 2017-12-02 ENCOUNTER — Encounter: Payer: Self-pay | Admitting: Oncology

## 2017-12-02 ENCOUNTER — Telehealth: Payer: Self-pay | Admitting: Oncology

## 2017-12-02 VITALS — BP 138/74 | HR 97 | Temp 97.5°F | Resp 18 | Ht 71.0 in | Wt 188.5 lb

## 2017-12-02 DIAGNOSIS — D751 Secondary polycythemia: Secondary | ICD-10-CM | POA: Diagnosis not present

## 2017-12-02 DIAGNOSIS — D473 Essential (hemorrhagic) thrombocythemia: Secondary | ICD-10-CM

## 2017-12-02 DIAGNOSIS — R5383 Other fatigue: Secondary | ICD-10-CM | POA: Insufficient documentation

## 2017-12-02 DIAGNOSIS — R161 Splenomegaly, not elsewhere classified: Secondary | ICD-10-CM

## 2017-12-02 DIAGNOSIS — R1012 Left upper quadrant pain: Secondary | ICD-10-CM | POA: Diagnosis not present

## 2017-12-02 DIAGNOSIS — R51 Headache: Secondary | ICD-10-CM

## 2017-12-02 LAB — CBC WITH DIFFERENTIAL (CANCER CENTER ONLY)
BASOS PCT: 1 %
Basophils Absolute: 0.1 10*3/uL (ref 0.0–0.1)
EOS ABS: 0.1 10*3/uL (ref 0.0–0.5)
Eosinophils Relative: 2 %
HEMATOCRIT: 42.9 % (ref 38.4–49.9)
HEMOGLOBIN: 13.4 g/dL (ref 13.0–17.1)
LYMPHS ABS: 1 10*3/uL (ref 0.9–3.3)
Lymphocytes Relative: 16 %
MCH: 27.7 pg (ref 27.2–33.4)
MCHC: 31.3 g/dL — ABNORMAL LOW (ref 32.0–36.0)
MCV: 88.3 fL (ref 79.3–98.0)
Monocytes Absolute: 0.5 10*3/uL (ref 0.1–0.9)
Monocytes Relative: 8 %
NEUTROS ABS: 4.7 10*3/uL (ref 1.5–6.5)
Neutrophils Relative %: 73 %
Platelet Count: 293 10*3/uL (ref 140–400)
RBC: 4.86 MIL/uL (ref 4.20–5.82)
RDW: 15 % — ABNORMAL HIGH (ref 11.0–14.6)
WBC Count: 6.4 10*3/uL (ref 4.0–10.3)

## 2017-12-02 NOTE — Telephone Encounter (Signed)
Scheduled appt per 9/10 los - gave patient AVS and calender per los.   

## 2017-12-02 NOTE — Progress Notes (Signed)
[facility] OFFICE PROGRESS NOTE  [address] [place], [place] [address]  DIAGNOSIS:  1) Essential thrombocythemia diagnosed in 2006  2) androgen-induced polycythemia.  PRIOR THERAPY: None   CURRENT THERAPY:  1) Hydroxyurea 500 mg by mouth daily except Friday he is on 1000 mg 2) phlebotomy on as-needed basis.  INTERVAL HISTORY: Dennis Martin 62 y.o. male returns for routine follow-up visit by himself.  The patient is feeling fine today with no specific complaints except for intermittent headaches, fatigue, and yawning.  He states that when he has phlebotomy the symptoms improved.  He denies fevers and chills.  Denies chest pain, shortness of breath, cough, hemoptysis.  Denies nausea, vomiting, constipation, diarrhea.  Denies recent weight loss or night sweats.  The patient is concerned about his splenomegaly and has questions about this today.  The patient is here for evaluation and repeat lab work.  MEDICAL HISTORY: Past Medical History:  Diagnosis Date  . Anxiety    hx of - 10 years ago  . Arthritis    both arthritis, wrist, hands   . BPH (benign prostatic hyperplasia)   . DVT of leg (deep venous thrombosis) ([facility]) 2001   post knee arthroscopy,also complicated by infection, PICC line used for long term antibiotics, was on coumadin x's 6 months  . Essential thrombocytosis ([facility])   . Headache    relative to testosterone level, it had been a problem, improved with clomid  . History of stress test 2008   done in [place] ,nuclear stress test- told that it was normal  . Hypertension   . Peripheral vascular disease ([facility])   . Sleep apnea    did surgery for the problem  . Spleen enlarged   . Thrombocythemia ([facility])    monitored by Dr. Earlie Server    ALLERGIES:  is allergic to keflex [cephalexin].  MEDICATIONS:  Current Outpatient Medications  Medication Sig Dispense Refill  . aspirin EC 81 MG tablet Take 81 mg by mouth 2 (two) times  daily.    . calcium carbonate (TUMS EX) 750 MG chewable tablet Chew 1 tablet by mouth daily as needed for heartburn.    . desonide (DESOWEN) 0.05 % cream Apply 1 application topically 2 (two) times daily as needed (rash).    . folic acid (FOLVITE) 245 MCG tablet Take 400 mcg by mouth daily.    . hydrocortisone 2.5 % lotion Apply 1 application topically daily as needed (rash/itching).     . hydroxyurea (HYDREA) 500 MG capsule TAKE 1 CAPSULE DAILY EXCEPT TAKE 2 CAPSULES ON FRIDAYS 105 capsule 1  . ketoconazole (NIZORAL) 2 % cream Apply 1 application topically daily.    Marland Kitchen lisinopril (PRINIVIL,ZESTRIL) 10 MG tablet TAKE 1 TABLET DAILY 90 tablet 1  . NYSTATIN powder APPLY TOPICALLY FOUR TIMES DAILY 60 g 9  . OVER THE COUNTER MEDICATION Vitafusion men's complete vitamin gummies; pt chews two gummies daily.    . tamsulosin (FLOMAX) 0.4 MG CAPS capsule Take 0.4 mg by mouth at bedtime.     . Testosterone (AXIRON) 30 MG/ACT SOLN 60 mg by Pump Prime route daily.    Marland Kitchen triamcinolone cream (KENALOG) 0.1 % Apply 1 application topically 2 (two) times daily as needed (skin irritation).      No current facility-administered medications for this visit.     SURGICAL HISTORY:  Past Surgical History:  Procedure Laterality Date  . EYE SURGERY     chalazion on both eyelids  . KNEE SURGERY  Bilateral 2001  . NASAL SINUS SURGERY    . TONSILLECTOMY    . TOTAL KNEE ARTHROPLASTY Right 05/22/2015  . TOTAL KNEE ARTHROPLASTY Right 05/22/2015   Procedure: TOTAL KNEE ARTHROPLASTY;  Surgeon: Frederik Pear, MD;  Location: [facility];  Service: Orthopedics;  Laterality: Right;  . TOTAL KNEE ARTHROPLASTY Left 07/17/2015  . TOTAL KNEE ARTHROPLASTY Left 07/17/2015   Procedure: TOTAL KNEE ARTHROPLASTY;  Surgeon: Frederik Pear, MD;  Location: [facility];  Service: Orthopedics;  Laterality: Left;  . ULNAR NERVE TRANSPOSITION  2007   left    REVIEW OF SYSTEMS:   Review of Systems  Constitutional: Negative for appetite change, chills, fever.   Positive for fatigue. HENT:   Negative for mouth sores, nosebleeds, sore throat and trouble swallowing.   Eyes: Negative for eye problems and icterus.  Respiratory: Negative for cough, hemoptysis, shortness of breath and wheezing.   Cardiovascular: Negative for chest pain and leg swelling.  Gastrointestinal: Negative for abdominal pain, constipation, diarrhea, nausea and vomiting.  Genitourinary: Negative for bladder incontinence, difficulty urinating, dysuria, frequency and hematuria.   Musculoskeletal: Negative for back pain, gait problem, neck pain and neck stiffness.  Skin: Negative for itching and rash.  Neurological: Negative for dizziness, extremity weakness, gait problem, light-headedness and seizures.  Positive for headaches. Hematological: Negative for adenopathy. Does not bruise/bleed easily.  Psychiatric/Behavioral: Negative for confusion, depression and sleep disturbance. The patient is not nervous/anxious.     PHYSICAL EXAMINATION:  Blood pressure 138/74, pulse 97, temperature (!) 97.5 F (36.4 C), temperature source Oral, resp. rate 18, height 5\' 11"  (1.803 m), weight 188 lb 8 oz (85.5 kg), SpO2 98 %.  ECOG PERFORMANCE STATUS: 1 - Symptomatic but completely ambulatory  Physical Exam  Constitutional: Oriented to person, place, and time and well-developed, well-nourished, and in no distress. No distress.  HENT:  Head: Normocephalic and atraumatic.  Mouth/Throat: Oropharynx is clear and moist. No oropharyngeal exudate.  Eyes: Conjunctivae are normal. Right eye exhibits no discharge. Left eye exhibits no discharge. No scleral icterus.  Neck: Normal range of motion. Neck supple.  Cardiovascular: Normal rate, regular rhythm, normal heart sounds and intact distal pulses.   Pulmonary/Chest: Effort normal and breath sounds normal. No respiratory distress. No wheezes. No rales.  Abdominal: Soft. Bowel sounds are normal. Exhibits no distension and no mass. There is no tenderness.   Splenomegaly noted. Musculoskeletal: Normal range of motion. Exhibits no edema.  Lymphadenopathy:    No cervical adenopathy.  Neurological: Alert and oriented to person, place, and time. Exhibits normal muscle tone. Gait normal. Coordination normal.  Skin: Skin is warm and dry. No rash noted. Not diaphoretic. No erythema. No pallor.  Psychiatric: Mood, memory and judgment normal.  Vitals reviewed.  LABORATORY DATA: Lab Results  Component Value Date   WBC 6.4 12/02/2017   HGB 13.4 12/02/2017   HCT 42.9 12/02/2017   MCV 88.3 12/02/2017   PLT 293 12/02/2017      Chemistry      Component Value Date/Time   NA 139 09/15/2017 1318   NA 139 11/10/2013 1508   K 4.1 09/15/2017 1318   K 4.3 11/10/2013 1508   CL 102 09/15/2017 1318   CL 102 05/04/2012 1309   CO2 27 09/15/2017 1318   CO2 28 11/10/2013 1508   BUN 18 09/15/2017 1318   BUN 15.4 11/10/2013 1508   CREATININE 1.17 09/15/2017 1318   CREATININE 1.1 11/10/2013 1508      Component Value Date/Time   CALCIUM 9.2 09/15/2017 1318  CALCIUM 9.6 11/10/2013 1508   ALKPHOS 85 09/15/2017 1318   ALKPHOS 78 11/10/2013 1508   AST 21 09/15/2017 1318   AST 19 11/10/2013 1508   ALT 11 09/15/2017 1318   ALT 15 11/10/2013 1508   BILITOT 0.5 09/15/2017 1318   BILITOT 0.43 11/10/2013 1508       RADIOGRAPHIC STUDIES:  No results found.   ASSESSMENT/PLAN:  Essential thrombocytosis This is a very pleasant 62 year old white male with essential thrombocythemia currently on treatment with hydroxyurea 500 mg by mouth daily except Friday where the patient takes 1000 mg. He is tolerating his treatment fairly well and there is no significant evidence for disease progression on his recent blood count. His platelets count today is down to 293,000. The patient also has androgen induced polycythemia.  He is currently on phlebotomy on as-needed basis.  The patient's hematocrit is normal today but the patient feels as though he wants to proceed with  phlebotomy secondary to symptoms.  The patient was seen with Dr. Julien Nordmann.  Discussed that we would like his hematocrit to be between 42-45 and that we would recommend holding off on phlebotomy today.  The patient reports symptoms and would like to proceed.  Discussed with the patient that he may proceed but he runs the risk of becoming anemic.  The patient would like to proceed despite this. For the essential thrombocythemia, he will continue his current treatment with hydroxyurea. The patient had a lot of questions regarding his enlarged spleen.  He is questioning whether or not he should proceed with a splenectomy at some point in the future.  We discussed the risks and benefits of a splenectomy.  Will obtain a repeat abdominal ultrasound to evaluate his splenomegaly.   The patient will follow-up in 3 months with repeat labs and possible phlebotomy.  The patient voices understanding of current disease status and treatment options and is in agreement with the current care plan.  All questions were answered. The patient knows to call the clinic with any problems, questions or concerns. We can certainly see the patient much sooner if necessary.   Orders Placed This Encounter  Procedures  . Phlebotomy therapeutic    Standing Status:   Future    Standing Expiration Date:   12/03/2018  . US Abdomen Complete    Standing Status:   Future    Standing Expiration Date:   12/02/2018    Order Specific Question:   Reason for Exam (SYMPTOM  OR DIAGNOSIS REQUIRED)    Answer:   Enlarged spleen. Evaluate size.    Order Specific Question:   Preferred imaging location?    Answer:   [id number]  . CBC with Differential (Cancer Center Only)    Standing Status:   Future    Standing Expiration Date:   12/03/2018  . CMP ([facility] only)    Standing Status:   Future    Standing Expiration Date:   12/03/2018     Mikey Bussing, DNP, AGPCNP-BC, AOCNP 12/02/17   ADDENDUM: Hematology/Oncology  Attending: I had a face-to-face encounter with the patient today.  I recommended his care plan.  This is a very pleasant 62 years old white male with myeloproliferative disorder presenting with essential thrombocythemia.  The patient also has polycythemia likely reactive in nature secondary to steroid treatment.  He has been on phlebotomy on as-needed basis.  He presented today complaining of increasing fatigue and headache.  He feels much better after phlebotomy and he is requesting  to have phlebotomy today even with the fact that his hematocrit is 42.9.  He is also complaining about pain in the left upper quadrant and worried about splenomegaly.  He had ultrasound of the abdomen few years ago that showed mild splenomegaly. I recommended for the patient to proceed with phlebotomy today based on his request.  I will also arrange for the patient to have ultrasound of the abdomen for further evaluation of his splenomegaly. He will come back for follow-up visit in 3 months for evaluation and consideration of phlebotomy if needed.  Disclaimer: This note was dictated with voice recognition software. Similar sounding words can inadvertently be transcribed and may be missed upon review. Eilleen Kempf, MD 12/02/17

## 2017-12-02 NOTE — Assessment & Plan Note (Signed)
This is a very pleasant 62 year old white male with essential thrombocythemia currently on treatment with hydroxyurea 500 mg by mouth daily except Friday where the patient takes 1000 mg. He is tolerating his treatment fairly well and there is no significant evidence for disease progression on his recent blood count. His platelets count today is down to 293,000. The patient also has androgen induced polycythemia.  He is currently on phlebotomy on as-needed basis.  The patient's hematocrit is normal today but the patient feels as though he wants to proceed with phlebotomy secondary to symptoms.  The patient was seen with Dr. Julien Nordmann.  Discussed that we would like his hematocrit to be between 42-45 and that we would recommend holding off on phlebotomy today.  The patient reports symptoms and would like to proceed.  Discussed with the patient that he may proceed but he runs the risk of becoming anemic.  The patient would like to proceed despite this. For the essential thrombocythemia, he will continue his current treatment with hydroxyurea. The patient had a lot of questions regarding his enlarged spleen.  He is questioning whether or not he should proceed with a splenectomy at some point in the future.  We discussed the risks and benefits of a splenectomy.  Will obtain a repeat abdominal ultrasound to evaluate his splenomegaly.   The patient will follow-up in 3 months with repeat labs and possible phlebotomy.  The patient voices understanding of current disease status and treatment options and is in agreement with the current care plan.  All questions were answered. The patient knows to call the clinic with any problems, questions or concerns. We can certainly see the patient much sooner if necessary.

## 2017-12-02 NOTE — Patient Instructions (Signed)

## 2017-12-02 NOTE — Progress Notes (Signed)
Mikey Bussing, NP placed order for phlebotomy today. Pt to receive based on symptoms and goal Hct < 42.

## 2017-12-14 ENCOUNTER — Encounter: Payer: Self-pay | Admitting: Family Medicine

## 2017-12-21 ENCOUNTER — Encounter: Payer: Self-pay | Admitting: Internal Medicine

## 2017-12-22 ENCOUNTER — Ambulatory Visit: Payer: BLUE CROSS/BLUE SHIELD | Admitting: Internal Medicine

## 2017-12-22 ENCOUNTER — Encounter: Payer: Self-pay | Admitting: Internal Medicine

## 2017-12-22 DIAGNOSIS — G4733 Obstructive sleep apnea (adult) (pediatric): Secondary | ICD-10-CM

## 2017-12-22 NOTE — Patient Instructions (Signed)
We can continue CPAP auto 5-15, mask of choice, humidifier supplies, [place]  Please call if we can help

## 2017-12-22 NOTE — Progress Notes (Signed)
HPI M former smoker followed for OSA, complicated by HBP Hx UPPP Unattended home sleep test 08/27/15- AHI 26.6/hour, desaturation to 78%, body weight 177 pounds ---------------------------------------------------------------------------------------------  12/20/16-62 year old male followed for OSA. Original diagnosis in the 1990s, treated with UPPP. Worsening symptoms in recent years and had seen Dr. Murlean Iba in 2017. for OSA. Former [employer] pt. Uses [facility] as DME.  CPAP auto 5-15/Advanced Download compliance 97%, averaging almost 8 hours per night, AHI 0.5/hour. He changed to nasal pillows mask several months ago and likes it much better than fullface. For unclear reason, in the last 6 weeks he has been more aware of dry mouth. He says he sleeps with mouth closed. Has a chinstrap tried only once.  12/22/2017- 62 year old male followed for OSA. Original diagnosis in the 1990s, treated with UPPP, complicated by BPH, DVT, thrombocytosis/splenomegaly. .  CPAP auto 5-15/Advanced -----OSA: DME [facility]. Pt wears CPAP most every night-unless he has a cold. DL attached; pressure works well and no new supplies needed at this time.  He reports unable to use his CPAP for a few nights recently with a flulike syndrome but overall he sleeps better with CPAP. Download was reviewed with him showing 83% compliance AHI 1.3/hour.  He uses nasal pillows.  Some dry mouth which we discussed.  ROS-see HPI   + = positive Constitutional:    weight loss, night sweats, fevers, chills, fatigue, lassitude. HEENT:    headaches, difficulty swallowing, tooth/dental problems, sore throat,       sneezing, itching, ear ache, nasal congestion, post nasal drip, snoring CV:    chest pain, orthopnea, PND, swelling in lower extremities, anasarca,                                                  dizziness, palpitations Resp:   shortness of breath with exertion or at rest.                productive cough,   non-productive cough, coughing up of  blood.              change in color of mucus.  wheezing.   Skin:    rash or lesions. GI:  No-   heartburn, indigestion, abdominal pain, nausea, vomiting, diarrhea,                 change in bowel habits, loss of appetite GU: dysuria, change in color of urine, no urgency or frequency.   flank pain. MS:   +joint pain, stiffness, decreased range of motion, back pain. Neuro-     nothing unusual Psych:  change in mood or affect.  depression or anxiety.   memory loss.  OBJ- Physical Exam General- Alert, Oriented, Affect-appropriate, Distress- none acute, +lean/ fit appearing Skin- rash-none, lesions- none, excoriation- none Lymphadenopathy- none Head- atraumatic            Eyes- Gross vision intact, PERRLA, conjunctivae and secretions clear            Ears- Hearing, canals-normal            Nose- Clear, no-Septal dev, mucus, polyps, erosion, perforation             Throat- Mallampati II/ UPPP , mucosa clear- not dry , drainage- none, tonsils- atrophic Neck- flexible , trachea midline, no stridor , thyroid nl, carotid no bruit Chest - symmetrical  excursion , unlabored           Heart/CV- RRR , no murmur , no gallop  , no rub, nl s1 s2                           - JVD- none , edema- none, stasis changes- none, varices- none           Lung- clear to P&A, wheeze- none, cough- none , dullness-none, rub- none           Chest wall-  Abd-  Br/ Gen/ Rectal- Not done, not indicated Extrem- cyanosis- none, clubbing, none, atrophy- none, strength- nl Neuro- grossly intact to observation

## 2017-12-22 NOTE — Assessment & Plan Note (Signed)
He benefits from CPAP with improved sleep.  Download shows adequate compliance and control as discussed with him.  Dry mouth he has mentioned this visit and last, has Biotene but never tried it.  Mucosa is not particularly dry on exam so I think he can manage with Biotene and adjusting his humidifier. Plan-continue CPAP auto 5-15

## 2017-12-23 DIAGNOSIS — N401 Enlarged prostate with lower urinary tract symptoms: Secondary | ICD-10-CM | POA: Diagnosis not present

## 2017-12-23 DIAGNOSIS — R350 Frequency of micturition: Secondary | ICD-10-CM | POA: Diagnosis not present

## 2017-12-23 DIAGNOSIS — E291 Testicular hypofunction: Secondary | ICD-10-CM | POA: Diagnosis not present

## 2017-12-23 LAB — PSA: PSA: 0.58

## 2017-12-26 ENCOUNTER — Ambulatory Visit
Admission: RE | Admit: 2017-12-26 | Discharge: 2017-12-26 | Disposition: A | Discharge status: Home / Self Care | Payer: BLUE CROSS/BLUE SHIELD | Source: Ambulatory Visit | Attending: Oncology | Admitting: Oncology

## 2017-12-26 DIAGNOSIS — R161 Splenomegaly, not elsewhere classified: Secondary | ICD-10-CM

## 2017-12-30 ENCOUNTER — Other Ambulatory Visit: Payer: Self-pay | Admitting: Family Medicine

## 2017-12-30 DIAGNOSIS — E291 Testicular hypofunction: Secondary | ICD-10-CM | POA: Diagnosis not present

## 2017-12-31 NOTE — Telephone Encounter (Signed)
MM-Plz see refill request/thx dmf

## 2018-02-06 ENCOUNTER — Telehealth: Payer: Self-pay | Admitting: Internal Medicine

## 2018-02-06 ENCOUNTER — Telehealth: Payer: Self-pay | Admitting: *Deleted

## 2018-02-06 NOTE — Telephone Encounter (Signed)
Pt called requesting appts for lab and phlebotomy.  Stated he experiences symptoms of increased fatigue, and headache; pt stated these symptoms related to high red blood count.  Erasmo Downer, NP notified. Spoke with pt and informed him that a scheduler will contact pt with appts date and time.  Pt voiced understanding.  Schedule message sent. Pt's   Phone    [phone].

## 2018-02-06 NOTE — Telephone Encounter (Signed)
Called about 11/18

## 2018-02-09 ENCOUNTER — Inpatient Hospital Stay: Payer: BLUE CROSS/BLUE SHIELD | Attending: Internal Medicine

## 2018-02-09 ENCOUNTER — Inpatient Hospital Stay: Payer: BLUE CROSS/BLUE SHIELD

## 2018-02-09 ENCOUNTER — Other Ambulatory Visit: Payer: Self-pay | Admitting: *Deleted

## 2018-02-09 DIAGNOSIS — D751 Secondary polycythemia: Secondary | ICD-10-CM | POA: Diagnosis not present

## 2018-02-09 DIAGNOSIS — D473 Essential (hemorrhagic) thrombocythemia: Secondary | ICD-10-CM

## 2018-02-09 LAB — CBC WITH DIFFERENTIAL (CANCER CENTER ONLY)
Abs Immature Granulocytes: 0.06 10*3/uL (ref 0.00–0.07)
BASOS ABS: 0.1 10*3/uL (ref 0.0–0.1)
Basophils Relative: 1 %
EOS ABS: 0.1 10*3/uL (ref 0.0–0.5)
EOS PCT: 1 %
HEMATOCRIT: 44.3 % (ref 39.0–52.0)
Hemoglobin: 13 g/dL (ref 13.0–17.0)
IMMATURE GRANULOCYTES: 1 %
LYMPHS ABS: 0.8 10*3/uL (ref 0.7–4.0)
Lymphocytes Relative: 12 %
MCH: 25 pg — ABNORMAL LOW (ref 26.0–34.0)
MCHC: 29.3 g/dL — AB (ref 30.0–36.0)
MCV: 85.4 fL (ref 80.0–100.0)
MONOS PCT: 11 %
Monocytes Absolute: 0.8 10*3/uL (ref 0.1–1.0)
Neutro Abs: 5.1 10*3/uL (ref 1.7–7.7)
Neutrophils Relative %: 74 %
Platelet Count: 275 10*3/uL (ref 150–400)
RBC: 5.19 MIL/uL (ref 4.22–5.81)
RDW: 15.6 % — AB (ref 11.5–15.5)
WBC Count: 6.9 10*3/uL (ref 4.0–10.5)
nRBC: 0 % (ref 0.0–0.2)

## 2018-02-09 LAB — CMP (CANCER CENTER ONLY)
ALBUMIN: 3.8 g/dL (ref 3.5–5.0)
ALT: 15 U/L (ref 0–44)
AST: 21 U/L (ref 15–41)
Alkaline Phosphatase: 77 U/L (ref 38–126)
Anion gap: 8 (ref 5–15)
BILIRUBIN TOTAL: 0.6 mg/dL (ref 0.3–1.2)
BUN: 17 mg/dL (ref 6–20)
CALCIUM: 9 mg/dL (ref 8.9–10.3)
CO2: 28 mmol/L (ref 22–32)
Chloride: 104 mmol/L (ref 98–111)
Creatinine: 1.01 mg/dL (ref 0.61–1.24)
GFR, Est AFR Am: >60 mL/min (ref >60)
GLUCOSE: 93 mg/dL (ref 70–99)
POTASSIUM: 4.4 mmol/L (ref 3.5–5.1)
Sodium: 140 mmol/L (ref 135–145)
TOTAL PROTEIN: 6.2 g/dL — AB (ref 6.5–8.1)

## 2018-02-09 NOTE — Patient Instructions (Signed)

## 2018-02-09 NOTE — Progress Notes (Signed)
Patient's HCT is within parameters, however Dr. Fredricka Bonine to go ahead with phlebotomy as patient verbalizes symptoms of headache, fatigue and shortness of breath. On visual assessment no acute distress noted.

## 2018-02-25 ENCOUNTER — Telehealth: Payer: Self-pay | Admitting: Internal Medicine

## 2018-02-25 NOTE — Telephone Encounter (Signed)
MM PAL 12/10 - moved appointments to 12/11. Spoke with patient.  °

## 2018-03-03 ENCOUNTER — Other Ambulatory Visit: Payer: BLUE CROSS/BLUE SHIELD

## 2018-03-03 ENCOUNTER — Ambulatory Visit: Payer: BLUE CROSS/BLUE SHIELD | Admitting: Internal Medicine

## 2018-03-04 ENCOUNTER — Inpatient Hospital Stay (HOSPITAL_BASED_OUTPATIENT_CLINIC_OR_DEPARTMENT_OTHER): Payer: BLUE CROSS/BLUE SHIELD | Admitting: Internal Medicine

## 2018-03-04 ENCOUNTER — Inpatient Hospital Stay: Payer: BLUE CROSS/BLUE SHIELD

## 2018-03-04 ENCOUNTER — Telehealth: Payer: Self-pay | Admitting: Internal Medicine

## 2018-03-04 ENCOUNTER — Inpatient Hospital Stay: Payer: BLUE CROSS/BLUE SHIELD | Attending: Internal Medicine

## 2018-03-04 ENCOUNTER — Encounter: Payer: Self-pay | Admitting: Internal Medicine

## 2018-03-04 VITALS — BP 134/70 | HR 81 | Temp 98.2°F | Resp 16 | Ht 70.0 in | Wt 189.4 lb

## 2018-03-04 DIAGNOSIS — D751 Secondary polycythemia: Secondary | ICD-10-CM | POA: Insufficient documentation

## 2018-03-04 DIAGNOSIS — I10 Essential (primary) hypertension: Secondary | ICD-10-CM

## 2018-03-04 DIAGNOSIS — D473 Essential (hemorrhagic) thrombocythemia: Secondary | ICD-10-CM | POA: Insufficient documentation

## 2018-03-04 DIAGNOSIS — Z79899 Other long term (current) drug therapy: Secondary | ICD-10-CM | POA: Insufficient documentation

## 2018-03-04 LAB — CMP (CANCER CENTER ONLY)
ALT: 13 U/L (ref 0–44)
ANION GAP: 9 (ref 5–15)
AST: 19 U/L (ref 15–41)
Albumin: 3.9 g/dL (ref 3.5–5.0)
Alkaline Phosphatase: 82 U/L (ref 38–126)
BUN: 19 mg/dL (ref 6–20)
CALCIUM: 8.6 mg/dL — AB (ref 8.9–10.3)
CHLORIDE: 104 mmol/L (ref 98–111)
CO2: 25 mmol/L (ref 22–32)
Creatinine: 0.92 mg/dL (ref 0.61–1.24)
GFR, Est AFR Am: >60 mL/min (ref >60)
GFR, Estimated: >60 mL/min (ref >60)
GLUCOSE: 105 mg/dL — AB (ref 70–99)
Potassium: 4.2 mmol/L (ref 3.5–5.1)
Sodium: 138 mmol/L (ref 135–145)
TOTAL PROTEIN: 6.1 g/dL — AB (ref 6.5–8.1)
Total Bilirubin: 0.6 mg/dL (ref 0.3–1.2)

## 2018-03-04 LAB — CBC WITH DIFFERENTIAL (CANCER CENTER ONLY)
Abs Immature Granulocytes: 0.03 10*3/uL (ref 0.00–0.07)
BASOS ABS: 0.1 10*3/uL (ref 0.0–0.1)
Basophils Relative: 1 %
EOS ABS: 0.1 10*3/uL (ref 0.0–0.5)
EOS PCT: 1 %
HEMATOCRIT: 40.8 % (ref 39.0–52.0)
Hemoglobin: 11.9 g/dL — ABNORMAL LOW (ref 13.0–17.0)
Immature Granulocytes: 0 %
Lymphocytes Relative: 15 %
Lymphs Abs: 1.1 10*3/uL (ref 0.7–4.0)
MCH: 24.7 pg — ABNORMAL LOW (ref 26.0–34.0)
MCHC: 29.2 g/dL — ABNORMAL LOW (ref 30.0–36.0)
MCV: 84.8 fL (ref 80.0–100.0)
Monocytes Absolute: 0.8 10*3/uL (ref 0.1–1.0)
Monocytes Relative: 11 %
Neutro Abs: 5 10*3/uL (ref 1.7–7.7)
Neutrophils Relative %: 72 %
Platelet Count: 286 10*3/uL (ref 150–400)
RBC: 4.81 MIL/uL (ref 4.22–5.81)
RDW: 15.9 % — ABNORMAL HIGH (ref 11.5–15.5)
WBC: 7.1 10*3/uL (ref 4.0–10.5)
nRBC: 0 % (ref 0.0–0.2)

## 2018-03-04 NOTE — Progress Notes (Signed)
[facility] Telephone:([phone]) [phone]   Fax:([phone]  OFFICE PROGRESS NOTE  [address], [place] [address]  DIAGNOSIS:  1) Essential thrombocythemia diagnosed in 2006  2) androgen-induced polycythemia.  PRIOR THERAPY: None   CURRENT THERAPY:  1) Hydroxyurea 500 mg by mouth daily except Friday he is on 1000 mg 2) phlebotomy on as-needed basis.  INTERVAL HISTORY: Dennis Martin 62 y.o. male returns to the clinic today for follow-up visit accompanied by his wife.  The patient has no complaints today.  He has been undergoing phlebotomy on as-needed basis for the androgen induced polycythemia.  He denied having any significant chest pain, shortness of breath, cough or hemoptysis.  He has no nausea, vomiting, diarrhea or constipation.  He has no headache or visual changes.  He is here today for evaluation and repeat blood work. MEDICAL HISTORY: Past Medical History:  Diagnosis Date  . Anxiety    hx of - 10 years ago  . Arthritis    both arthritis, wrist, hands   . BPH (benign prostatic hyperplasia)   . DVT of leg (deep venous thrombosis) ([facility]) 2001   post knee arthroscopy,also complicated by infection, PICC line used for long term antibiotics, was on coumadin x's 6 months  . Essential thrombocytosis ([place])   . Headache    relative to testosterone level, it had been a problem, improved with clomid  . History of stress test 2008   done in [place] ,nuclear stress test- told that it was normal  . Hypertension   . Peripheral vascular disease ([place])   . Sleep apnea    did surgery for the problem  . Spleen enlarged   . Thrombocythemia ([place])    monitored by Dr. Earlie Server    ALLERGIES:  is allergic to keflex [cephalexin].  MEDICATIONS:  Current Outpatient Medications  Medication Sig Dispense Refill  . aspirin EC 81 MG tablet Take 81 mg by mouth 2 (two) times daily.    . calcium carbonate (TUMS EX) 750 MG chewable tablet  Chew 1 tablet by mouth daily as needed for heartburn.    . desonide (DESOWEN) 0.05 % cream Apply 1 application topically 2 (two) times daily as needed (rash).    . folic acid (FOLVITE) 381 MCG tablet Take 400 mcg by mouth daily.    . hydrocortisone 2.5 % lotion Apply 1 application topically daily as needed (rash/itching).     . hydroxyurea (HYDREA) 500 MG capsule TAKE 1 CAPSULE DAILY EXCEPT TAKE 2 CAPSULES ON FRIDAYS 105 capsule 4  . ketoconazole (NIZORAL) 2 % cream Apply 1 application topically daily.    Marland Kitchen lisinopril (PRINIVIL,ZESTRIL) 10 MG tablet TAKE 1 TABLET DAILY 90 tablet 1  . NYSTATIN powder APPLY TOPICALLY FOUR TIMES DAILY 60 g 9  . OVER THE COUNTER MEDICATION Vitafusion men's complete vitamin gummies; pt chews two gummies daily.    . tamsulosin (FLOMAX) 0.4 MG CAPS capsule Take 0.4 mg by mouth at bedtime.     . Testosterone (AXIRON) 30 MG/ACT SOLN 60 mg by Pump Prime route daily.    Marland Kitchen triamcinolone cream (KENALOG) 0.1 % Apply 1 application topically 2 (two) times daily as needed (skin irritation).      No current facility-administered medications for this visit.     SURGICAL HISTORY:  Past Surgical History:  Procedure Laterality Date  . EYE SURGERY     chalazion on both eyelids  . KNEE SURGERY Bilateral 2001  . NASAL  SINUS SURGERY    . TONSILLECTOMY    . TOTAL KNEE ARTHROPLASTY Right 05/22/2015  . TOTAL KNEE ARTHROPLASTY Right 05/22/2015   Procedure: TOTAL KNEE ARTHROPLASTY;  Surgeon: Frederik Pear, MD;  Location: [facility];  Service: Orthopedics;  Laterality: Right;  . TOTAL KNEE ARTHROPLASTY Left 07/17/2015  . TOTAL KNEE ARTHROPLASTY Left 07/17/2015   Procedure: TOTAL KNEE ARTHROPLASTY;  Surgeon: Frederik Pear, MD;  Location: [facility];  Service: Orthopedics;  Laterality: Left;  . ULNAR NERVE TRANSPOSITION  2007   left    REVIEW OF SYSTEMS:  A comprehensive review of systems was negative.   PHYSICAL EXAMINATION: General appearance: alert, cooperative and no distress Head:  Normocephalic, without obvious abnormality, atraumatic Neck: no adenopathy Lymph nodes: Cervical, supraclavicular, and axillary nodes normal. Resp: clear to auscultation bilaterally Back: symmetric, no curvature. ROM normal. No CVA tenderness. Cardio: regular rate and rhythm, S1, S2 normal, no murmur, click, rub or gallop GI: soft, non-tender; bowel sounds normal; no masses,  no organomegaly Extremities: extremities normal, atraumatic, no cyanosis or edema  ECOG PERFORMANCE STATUS: 0 - Asymptomatic  Blood pressure 134/70, pulse 81, temperature 98.2 F (36.8 C), temperature source Oral, resp. rate 16, height 5\' 10"  (1.778 m), weight 189 lb 6.4 oz (85.9 kg), SpO2 100 %.  LABORATORY DATA: Lab Results  Component Value Date   WBC 7.1 03/04/2018   HGB 11.9 (L) 03/04/2018   HCT 40.8 03/04/2018   MCV 84.8 03/04/2018   PLT 286 03/04/2018      Chemistry      Component Value Date/Time   NA 138 03/04/2018 1424   NA 139 11/10/2013 1508   K 4.2 03/04/2018 1424   K 4.3 11/10/2013 1508   CL 104 03/04/2018 1424   CL 102 05/04/2012 1309   CO2 25 03/04/2018 1424   CO2 28 11/10/2013 1508   BUN 19 03/04/2018 1424   BUN 15.4 11/10/2013 1508   CREATININE 0.92 03/04/2018 1424   CREATININE 1.1 11/10/2013 1508      Component Value Date/Time   CALCIUM 8.6 (L) 03/04/2018 1424   CALCIUM 9.6 11/10/2013 1508   ALKPHOS 82 03/04/2018 1424   ALKPHOS 78 11/10/2013 1508   AST 19 03/04/2018 1424   AST 19 11/10/2013 1508   ALT 13 03/04/2018 1424   ALT 15 11/10/2013 1508   BILITOT 0.6 03/04/2018 1424   BILITOT 0.43 11/10/2013 1508       RADIOGRAPHIC STUDIES: No results found.  ASSESSMENT AND PLAN: This is a very pleasant 62 years old white male with essential thrombocythemia currently on treatment with hydroxyurea 500 mg by mouth daily except Friday where the patient takes 1000 mg. He is tolerating his treatment fairly well and there is no significant evidence for disease progression on his recent  blood count. His platelets count today is down to 2 86,000 The patient also has androgen induced polycythemia.  He has been undergoing phlebotomy on as-needed basis.  His CBC today showed normal hematocrit.  I recommended for the patient to continue on observation with repeat CBC, iron study, ferritin and comprehensive metabolic panel in 3 months.  He will call for phlebotomy if he has any symptoms. For the essential thrombocythemia, he will continue his current treatment with hydroxyurea. The patient was advised to call immediately if he has any concerning symptoms in the interval. The patient had a lot of questions and I spent a good amount of money with him today answering his questions and addressing his issues. The patient voices understanding of  current disease status and treatment options and is in agreement with the current care plan.  All questions were answered. The patient knows to call the clinic with any problems, questions or concerns. We can certainly see the patient much sooner if necessary.   Disclaimer: This note was dictated with voice recognition software. Similar sounding words can inadvertently be transcribed and may be missed upon review.

## 2018-03-04 NOTE — Telephone Encounter (Signed)
Gave patient avs report and appointments for March  °

## 2018-03-24 ENCOUNTER — Telehealth: Payer: Self-pay | Admitting: Internal Medicine

## 2018-03-24 NOTE — Telephone Encounter (Signed)
Scheduled appt per 12/30 sch message - pt is aware of appt date and time   

## 2018-03-31 ENCOUNTER — Inpatient Hospital Stay: Payer: BLUE CROSS/BLUE SHIELD | Attending: Internal Medicine

## 2018-03-31 ENCOUNTER — Inpatient Hospital Stay: Payer: BLUE CROSS/BLUE SHIELD

## 2018-03-31 ENCOUNTER — Other Ambulatory Visit: Payer: Self-pay | Admitting: Medical Oncology

## 2018-03-31 DIAGNOSIS — M19079 Primary osteoarthritis, unspecified ankle and foot: Secondary | ICD-10-CM | POA: Diagnosis not present

## 2018-03-31 DIAGNOSIS — D751 Secondary polycythemia: Secondary | ICD-10-CM | POA: Diagnosis not present

## 2018-03-31 DIAGNOSIS — M79674 Pain in right toe(s): Secondary | ICD-10-CM | POA: Diagnosis not present

## 2018-03-31 DIAGNOSIS — D473 Essential (hemorrhagic) thrombocythemia: Secondary | ICD-10-CM

## 2018-03-31 DIAGNOSIS — M79675 Pain in left toe(s): Secondary | ICD-10-CM | POA: Diagnosis not present

## 2018-03-31 DIAGNOSIS — M65332 Trigger finger, left middle finger: Secondary | ICD-10-CM | POA: Diagnosis not present

## 2018-03-31 LAB — CBC WITH DIFFERENTIAL (CANCER CENTER ONLY)
Abs Immature Granulocytes: 0.04 10*3/uL (ref 0.00–0.07)
Basophils Absolute: 0.1 10*3/uL (ref 0.0–0.1)
Basophils Relative: 1 %
EOS ABS: 0.1 10*3/uL (ref 0.0–0.5)
Eosinophils Relative: 1 %
HEMATOCRIT: 42.1 % (ref 39.0–52.0)
Hemoglobin: 12.1 g/dL — ABNORMAL LOW (ref 13.0–17.0)
Immature Granulocytes: 1 %
Lymphocytes Relative: 10 %
Lymphs Abs: 0.7 10*3/uL (ref 0.7–4.0)
MCH: 23.9 pg — ABNORMAL LOW (ref 26.0–34.0)
MCHC: 28.7 g/dL — ABNORMAL LOW (ref 30.0–36.0)
MCV: 83 fL (ref 80.0–100.0)
Monocytes Absolute: 0.7 10*3/uL (ref 0.1–1.0)
Monocytes Relative: 10 %
Neutro Abs: 5.7 10*3/uL (ref 1.7–7.7)
Neutrophils Relative %: 77 %
Platelet Count: 296 10*3/uL (ref 150–400)
RBC: 5.07 MIL/uL (ref 4.22–5.81)
RDW: 16.6 % — ABNORMAL HIGH (ref 11.5–15.5)
WBC Count: 7.3 10*3/uL (ref 4.0–10.5)
nRBC: 0 % (ref 0.0–0.2)

## 2018-04-14 DIAGNOSIS — M25512 Pain in left shoulder: Secondary | ICD-10-CM | POA: Diagnosis not present

## 2018-05-02 ENCOUNTER — Other Ambulatory Visit: Payer: Self-pay | Admitting: Family Medicine

## 2018-05-07 ENCOUNTER — Encounter: Payer: Self-pay | Admitting: Family Medicine

## 2018-05-07 ENCOUNTER — Ambulatory Visit: Payer: 59 | Admitting: Family Medicine

## 2018-05-07 ENCOUNTER — Ambulatory Visit (INDEPENDENT_AMBULATORY_CARE_PROVIDER_SITE_OTHER): Payer: 59

## 2018-05-07 VITALS — BP 142/78 | HR 88 | Temp 98.1°F | Ht 70.0 in | Wt 177.0 lb

## 2018-05-07 DIAGNOSIS — R0602 Shortness of breath: Secondary | ICD-10-CM | POA: Diagnosis not present

## 2018-05-07 DIAGNOSIS — J40 Bronchitis, not specified as acute or chronic: Secondary | ICD-10-CM | POA: Diagnosis not present

## 2018-05-07 DIAGNOSIS — R05 Cough: Secondary | ICD-10-CM

## 2018-05-07 DIAGNOSIS — R059 Cough, unspecified: Secondary | ICD-10-CM

## 2018-05-07 DIAGNOSIS — J209 Acute bronchitis, unspecified: Secondary | ICD-10-CM | POA: Insufficient documentation

## 2018-05-07 MED ORDER — ALBUTEROL SULFATE HFA 108 (90 BASE) MCG/ACT IN AERS: 2.0000 | INHALATION_SPRAY | Freq: Four times a day (QID) | RESPIRATORY_TRACT | 0 refills | Status: DC | PRN

## 2018-05-07 MED ORDER — BENZONATATE 100 MG PO CAPS: 100.0000 mg | ORAL_CAPSULE | Freq: Three times a day (TID) | ORAL | 0 refills | Status: DC | PRN

## 2018-05-07 MED ORDER — PREDNISONE 10 MG (21) PO TBPK: ORAL_TABLET | ORAL | 0 refills | Status: DC

## 2018-05-07 NOTE — Patient Instructions (Signed)
Acute Bronchitis, Adult Acute bronchitis is when air tubes (bronchi) in the lungs suddenly get swollen. The condition can make it hard to breathe. It can also cause these symptoms:  A cough.  Coughing up clear, yellow, or green mucus.  Wheezing.  Chest congestion.  Shortness of breath.  A fever.  Body aches.  Chills.  A sore throat. Follow these instructions at home:  Medicines  Take over-the-counter and prescription medicines only as told by your doctor.  If you were prescribed an antibiotic medicine, take it as told by your doctor. Do not stop taking the antibiotic even if you start to feel better. General instructions  Rest.  Drink enough fluids to keep your pee (urine) pale yellow.  Avoid smoking and secondhand smoke. If you smoke and you need help quitting, ask your doctor. Quitting will help your lungs heal faster.  Use an inhaler, cool mist vaporizer, or humidifier as told by your doctor.  Keep all follow-up visits as told by your doctor. This is important. How is this prevented? To lower your risk of getting this condition again:  Wash your hands often with soap and water. If you cannot use soap and water, use hand sanitizer.  Avoid contact with people who have cold symptoms.  Try not to touch your hands to your mouth, nose, or eyes.  Make sure to get the flu shot every year. Contact a doctor if:  Your symptoms do not get better in 2 weeks. Get help right away if:  You cough up blood.  You have chest pain.  You have very bad shortness of breath.  You become dehydrated.  You faint (pass out) or keep feeling like you are going to pass out.  You keep throwing up (vomiting).  You have a very bad headache.  Your fever or chills gets worse. This information is not intended to replace advice given to you by your health care provider. Make sure you discuss any questions you have with your health care provider. Document Released: 08/28/2007 Document  Revised: 10/23/2016 Document Reviewed: 08/30/2015 Elsevier Interactive Patient Education  2019 Elsevier Inc.  

## 2018-05-07 NOTE — Progress Notes (Signed)
Dennis Martin - 63 y.o. male MRN [id number]  Date of birth: October 12, 1955  Subjective Chief Complaint  Patient presents with  . Cough    ongoing for one week-denies fevers or body aches.     HPI Dennis Martin is a 63 y.o. male who complains of congestion, post nasal drip, productive cough and mild shortness of breath during cough and while lying flat and mild wheezing during cough for 7 days. He denies a history of chest pain, chills, fevers, myalgias, nausea and vomiting and denies a history of asthma. Patient does not smoke cigarettes.  Has history of DVT, denies chest pain or leg swelling.   ROS:  A comprehensive ROS was completed and negative except as noted per HPI    Allergies  Allergen Reactions  . Keflex [Cephalexin] Rash    Past Medical History:  Diagnosis Date  . Anxiety    hx of - 10 years ago  . Arthritis    both arthritis, wrist, hands   . BPH (benign prostatic hyperplasia)   . DVT of leg (deep venous thrombosis) ([facility]) 2001   post knee arthroscopy,also complicated by infection, PICC line used for long term antibiotics, was on coumadin x's 6 months  . Essential thrombocytosis ([place])   . Headache    relative to testosterone level, it had been a problem, improved with clomid  . History of stress test 2008   done in [place] ,nuclear stress test- told that it was normal  . Hypertension   . Peripheral vascular disease ([facility])   . Sleep apnea    did surgery for the problem  . Spleen enlarged   . Thrombocythemia ([facility])    monitored by Dr. Earlie Server    Past Surgical History:  Procedure Laterality Date  . EYE SURGERY     chalazion on both eyelids  . KNEE SURGERY Bilateral 2001  . NASAL SINUS SURGERY    . TONSILLECTOMY    . TOTAL KNEE ARTHROPLASTY Right 05/22/2015  . TOTAL KNEE ARTHROPLASTY Right 05/22/2015   Procedure: TOTAL KNEE ARTHROPLASTY;  Surgeon: Frederik Pear, MD;  Location: [facility];  Service: Orthopedics;  Laterality: Right;  . TOTAL KNEE ARTHROPLASTY Left  07/17/2015  . TOTAL KNEE ARTHROPLASTY Left 07/17/2015   Procedure: TOTAL KNEE ARTHROPLASTY;  Surgeon: Frederik Pear, MD;  Location: [facility];  Service: Orthopedics;  Laterality: Left;  . ULNAR NERVE TRANSPOSITION  2007   left    Social History   Socioeconomic History  . Marital status: Married    Spouse name: Not on file  . Number of children: Not on file  . Years of education: Not on file  . Highest education level: Not on file  Occupational History  . Not on file  Social Needs  . Financial resource strain: Not on file  . Food insecurity:    Worry: Not on file    Inability: Not on file  . Transportation needs:    Medical: Not on file    Non-medical: Not on file  Tobacco Use  . Smoking status: Former Smoker    Last attempt to quit: 05/06/2003    Years since quitting: 15.0  . Smokeless tobacco: Never Used  . Tobacco comment: Quit 2005  Substance and Sexual Activity  . Alcohol use: Yes    Alcohol/week: 14.0 standard drinks    Types: 14 Cans of beer per week    Comment: 2 beers daily  . Drug use: No  . Sexual activity: Not on file  Lifestyle  .  Physical activity:    Days per week: Not on file    Minutes per session: Not on file  . Stress: Not on file  Relationships  . Social connections:    Talks on phone: Not on file    Gets together: Not on file    Attends religious service: Not on file    Active member of club or organization: Not on file    Attends meetings of clubs or organizations: Not on file    Relationship status: Not on file  Other Topics Concern  . Not on file  Social History Narrative  . Not on file    Family History  Problem Relation Age of Onset  . Colon cancer Paternal Aunt 76  . Stomach cancer Neg Hx     Health Maintenance  Topic Date Due  . HIV Screening  07/09/1972  . COLONOSCOPY  05/26/2022  . TETANUS/TDAP  04/08/2023  . INFLUENZA VACCINE  Completed  . Hepatitis C Screening  Completed     ----------------------------------------------------------------------------------------------------------------------------------------------------------------------------------------------------------------- Physical Exam BP (!) 142/78   Pulse 88   Temp 98.1 F (36.7 C) (Oral)   Ht 5\' 10"  (1.778 m)   Wt 177 lb (80.3 kg)   BMI 25.40 kg/m   Physical Exam Constitutional:      Appearance: Normal appearance.  HENT:     Head: Normocephalic and atraumatic.     Right Ear: Tympanic membrane normal.     Left Ear: Tympanic membrane normal.     Nose: Congestion present.     Mouth/Throat:     Mouth: Mucous membranes are moist.  Eyes:     General: No scleral icterus. Neck:     Musculoskeletal: Neck supple.  Cardiovascular:     Rate and Rhythm: Normal rate and regular rhythm.  Pulmonary:     Effort: Pulmonary effort is normal.     Breath sounds: Wheezing present.     Comments: A few scattered expiratory wheezes.  Lymphadenopathy:     Cervical: No cervical adenopathy.  Skin:    General: Skin is warm and dry.  Neurological:     General: No focal deficit present.     Mental Status: He is alert and oriented to person, place, and time.  Psychiatric:        Mood and Affect: Mood normal.        Behavior: Behavior normal.     ------------------------------------------------------------------------------------------------------------------------------------------------------------------------------------------------------------------- Assessment and Plan  Bronchitis Rx for prednisone, albuterol and tessalon.  Symptomatic therapy suggested: push fluids, rest, use vaporizer or mist prn and return office visit prn if symptoms persist or worsen. Lack of antibiotic effectiveness for most causes of bronchitis discussed with him. Call or return to clinic prn if these symptoms worsen or fail to improve as anticipated.  Orders Placed This Encounter  Procedures  . DG Chest 2 View     Standing Status:   Future    Number of Occurrences:   1    Standing Expiration Date:   07/06/2019    Order Specific Question:   Reason for Exam (SYMPTOM  OR DIAGNOSIS REQUIRED)    Answer:   cough, shortness of breath    Order Specific Question:   Preferred imaging location?    Answer:   Internal    Order Specific Question:   Radiology Contrast Protocol - do NOT remove file path    Answer:   [email or web address]

## 2018-05-07 NOTE — Assessment & Plan Note (Addendum)
Rx for prednisone, albuterol and tessalon.  Symptomatic therapy suggested: push fluids, rest, use vaporizer or mist prn and return office visit prn if symptoms persist or worsen. Lack of antibiotic effectiveness for most cause of bronchitis discussed with him. Call or return to clinic prn if these symptoms worsen or fail to improve as anticipated.

## 2018-05-25 DIAGNOSIS — D751 Secondary polycythemia: Secondary | ICD-10-CM | POA: Insufficient documentation

## 2018-05-25 NOTE — Progress Notes (Signed)
Subjective:   Patient ID: Dennis Martin, male    DOB: 1955-06-22, 63 y.o.   MRN: [id number]  Dennis Martin is a pleasant 63 y.o. year old male who presents to clinic today with Annual Exam (Patient is here today for a CPE. He is currently fasting. Immunizations are UTD.) and FYI (Patient will be having surgery on 3.13.20 with Dr. 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  He will be having hand surgery with Dr. Grandville Silos for trigger finger release along with arthroplasty. Health Maintenance  Topic Date Due  . COLONOSCOPY  05/26/2022  . TETANUS/TDAP  04/08/2023  . INFLUENZA VACCINE  Completed  . Hepatitis C Screening  Completed  . HIV Screening  Discontinued   Essential Thrombocytosis- diagnosed in 2006. Takes hydroxyurea, folate.  Receives prn phlebotomy.  Last therapeutic phlebotomy was done on 12/02/17.  Was last seen by Dr. Julien Nordmann on 03/04/18.  Note reviewed. ASSESSMENT AND PLAN was as follows: This is a very pleasant 63 years old white male with essential thrombocythemia currently on treatment with hydroxyurea 500 mg by mouth daily except Friday where the patient takes 1000 mg. He is tolerating his treatment fairly well and there is no significant evidence for disease progression on his recent blood count. His platelets count today is down to 2 86,000 The patient also has androgen induced polycythemia.  He has been undergoing phlebotomy on as-needed basis.  His CBC today showed normal hematocrit.  I recommended for the patient to continue on observation with repeat CBC, iron study, ferritin and comprehensive metabolic panel in 3 months.  He will call for phlebotomy if he has any symptoms. For the essential thrombocythemia, he will continue his current treatment with hydroxyurea. The patient was advised to call immediately if he  has any concerning symptoms in the interval.   Lab Results  Component Value Date   WBC 7.3 03/31/2018   HGB 12.1 (L) 03/31/2018   HCT 42.1 03/31/2018   MCV 83.0 03/31/2018   PLT 296 03/31/2018    BPH/Low t- followed by Dr. Louis Meckel at [facility] urology.  He is on Axiron ( with androgen induced polycythemia) and flomax. Last saw him in 12/2017.  Had labs, including PSA done at that time. Lab Results  Component Value Date   TESTOSTERONE 424.82 10/04/2015    Lab Results  Component Value Date   PSA 0.64 06/24/2017   PSA 0.87 05/15/2017   PSA 0.53 05/06/2016   HTN- well controlled on lisinopril 10 mg daily. Denies HA, blurred vision, CP or SOB.  Lab Results  Component Value Date   CREATININE 0.92 03/04/2018     Lab Results  Component Value Date   CHOL 138 05/15/2017   HDL 39.80 05/15/2017   LDLCALC 82 05/15/2017   TRIG 81.0 05/15/2017   CHOLHDL 3 05/15/2017   Lab Results  Component Value Date   ALT 13 03/04/2018   AST 19 03/04/2018   ALKPHOS 82 03/04/2018   BILITOT 0.6 03/04/2018   Lab Results  Component Value Date   NA 138 03/04/2018   K 4.2 03/04/2018   CL 104 03/04/2018   CO2 25 03/04/2018   Lab Results  Component Value Date   CREATININE 0.92 03/04/2018       Current Outpatient Medications on File Prior to Visit  Medication Sig Dispense Refill  . albuterol (PROVENTIL HFA;VENTOLIN HFA) 108 (  90 Base) MCG/ACT inhaler Inhale 2 puffs into the lungs every 6 (six) hours as needed for wheezing or shortness of breath. 1 Inhaler 0  . aspirin EC 81 MG tablet Take 81 mg by mouth 2 (two) times daily.    . benzonatate (TESSALON) 100 MG capsule Take 1 capsule (100 mg total) by mouth 3 (three) times daily as needed for cough. 20 capsule 0  . calcium carbonate (TUMS EX) 750 MG chewable tablet Chew 1 tablet by mouth daily as needed for heartburn.    . desonide (DESOWEN) 0.05 % cream Apply 1 application topically 2 (two) times daily as needed (rash).    . folic acid  (FOLVITE) 784 MCG tablet Take 400 mcg by mouth daily.    . hydrocortisone 2.5 % lotion Apply 1 application topically daily as needed (rash/itching).     . hydroxyurea (HYDREA) 500 MG capsule TAKE 1 CAPSULE DAILY EXCEPT TAKE 2 CAPSULES ON FRIDAYS 105 capsule 4  . ketoconazole (NIZORAL) 2 % cream Apply 1 application topically daily.    Marland Kitchen lisinopril (PRINIVIL,ZESTRIL) 10 MG tablet TAKE 1 TABLET DAILY 90 tablet 1  . meloxicam (MOBIC) 7.5 MG tablet     . NYSTATIN powder APPLY TOPICALLY FOUR TIMES DAILY 60 g 9  . OVER THE COUNTER MEDICATION Vitafusion men's complete vitamin gummies; pt chews two gummies daily.    . predniSONE (STERAPRED UNI-PAK 21 TAB) 10 MG (21) TBPK tablet Take as directed on packaging 21 tablet 0  . tamsulosin (FLOMAX) 0.4 MG CAPS capsule Take 0.4 mg by mouth at bedtime.     . Testosterone (AXIRON) 30 MG/ACT SOLN 60 mg by Pump Prime route daily.    Marland Kitchen triamcinolone cream (KENALOG) 0.1 % Apply 1 application topically 2 (two) times daily as needed (skin irritation).      No current facility-administered medications on file prior to visit.     Allergies  Allergen Reactions  . Keflex [Cephalexin] Rash    Past Medical History:  Diagnosis Date  . Anxiety    hx of - 10 years ago  . Arthritis    both arthritis, wrist, hands   . BPH (benign prostatic hyperplasia)   . DVT of leg (deep venous thrombosis) ([place]) 2001   post knee arthroscopy,also complicated by infection, PICC line used for long term antibiotics, was on coumadin x's 6 months  . Essential thrombocytosis ([place])   . Headache    relative to testosterone level, it had been a problem, improved with clomid  . History of stress test 2008   done in [place] ,nuclear stress test- told that it was normal  . Hypertension   . Peripheral vascular disease ([place])   . Sleep apnea    did surgery for the problem  . Spleen enlarged   . Thrombocythemia ([place])    monitored by Dr. Earlie Server    Past Surgical History:  Procedure  Laterality Date  . EYE SURGERY     chalazion on both eyelids  . KNEE SURGERY Bilateral 2001  . NASAL SINUS SURGERY    . TONSILLECTOMY    . TOTAL KNEE ARTHROPLASTY Right 05/22/2015  . TOTAL KNEE ARTHROPLASTY Right 05/22/2015   Procedure: TOTAL KNEE ARTHROPLASTY;  Surgeon: Frederik Pear, MD;  Location: [place];  Service: Orthopedics;  Laterality: Right;  . TOTAL KNEE ARTHROPLASTY Left 07/17/2015  . TOTAL KNEE ARTHROPLASTY Left 07/17/2015   Procedure: TOTAL KNEE ARTHROPLASTY;  Surgeon: Frederik Pear, MD;  Location: [place];  Service: Orthopedics;  Laterality: Left;  .  ULNAR NERVE TRANSPOSITION  2007   left    Family History  Problem Relation Age of Onset  . Colon cancer Paternal Aunt 103  . Stomach cancer Neg Hx     Social History   Socioeconomic History  . Marital status: Married    Spouse name: Not on file  . Number of children: Not on file  . Years of education: Not on file  . Highest education level: Not on file  Occupational History  . Not on file  Social Needs  . Financial resource strain: Not on file  . Food insecurity:    Worry: Not on file    Inability: Not on file  . Transportation needs:    Medical: Not on file    Non-medical: Not on file  Tobacco Use  . Smoking status: Former Smoker    Last attempt to quit: 05/06/2003    Years since quitting: 15.0  . Smokeless tobacco: Never Used  . Tobacco comment: Quit 2005  Substance and Sexual Activity  . Alcohol use: Yes    Alcohol/week: 14.0 standard drinks    Types: 14 Cans of beer per week    Comment: 2 beers daily  . Drug use: No  . Sexual activity: Not on file  Lifestyle  . Physical activity:    Days per week: Not on file    Minutes per session: Not on file  . Stress: Not on file  Relationships  . Social connections:    Talks on phone: Not on file    Gets together: Not on file    Attends religious service: Not on file    Active member of club or organization: Not on file    Attends meetings of clubs or  organizations: Not on file    Relationship status: Not on file  . Intimate partner violence:    Fear of current or ex partner: Not on file    Emotionally abused: Not on file    Physically abused: Not on file    Forced sexual activity: Not on file  Other Topics Concern  . Not on file  Social History Narrative  . Not on file   The PMH, PSH, Social History, Family History, Medications, and allergies have been reviewed in [facility], and have been updated if relevant.   Review of Systems  Constitutional: Negative.   HENT: Negative.   Eyes: Negative.   Respiratory: Negative.   Cardiovascular: Negative.   Gastrointestinal: Negative.   Endocrine: Negative.   Genitourinary: Negative.   Musculoskeletal: Positive for arthralgias.  Skin: Negative for rash.  Allergic/Immunologic: Negative.   Neurological: Negative.   Hematological: Negative.   Psychiatric/Behavioral: Negative.   All other systems reviewed and are negative.      Objective:    BP 140/82 (BP Location: Left Arm, Patient Position: Sitting, Cuff Size: Normal)   Pulse 81   Temp (!) 97.5 F (36.4 C) (Oral)   Ht 5\' 11"  (1.803 m)   Wt 178 lb 6.4 oz (80.9 kg)   SpO2 98%   BMI 24.88 kg/m   Wt Readings from Last 3 Encounters:  05/27/18 178 lb 6.4 oz (80.9 kg)  05/07/18 177 lb (80.3 kg)  03/04/18 189 lb 6.4 oz (85.9 kg)   Physical Exam  Constitutional: He is oriented to person, place, and time and well-developed, well-nourished, and in no distress. No distress.  HENT:  Head: Normocephalic and atraumatic.  Eyes: Conjunctivae are normal.  Cardiovascular: Normal rate and regular rhythm.  Pulmonary/Chest: Effort normal  and breath sounds normal.  Genitourinary:    Genitourinary Comments: Deferred- has urologist   Musculoskeletal: Normal range of motion.        General: No edema.  Neurological: He is alert and oriented to person, place, and time.  Skin: Skin is warm and dry. He is not diaphoretic.  Psychiatric: Mood, memory,  affect and judgment normal.  Nursing note and vitals reviewed.        Assessment & Plan:   Visit for well man health check  Essential thrombocytosis ([place])  Essential hypertension - Plan: Lipid panel, Comprehensive metabolic panel  Polycythemia, secondary  Low testosterone No follow-ups on file.

## 2018-05-27 ENCOUNTER — Ambulatory Visit (INDEPENDENT_AMBULATORY_CARE_PROVIDER_SITE_OTHER): Payer: 59 | Admitting: Family Medicine

## 2018-05-27 ENCOUNTER — Encounter: Payer: Self-pay | Admitting: Family Medicine

## 2018-05-27 VITALS — BP 140/82 | HR 81 | Temp 97.5°F | Ht 71.0 in | Wt 178.4 lb

## 2018-05-27 DIAGNOSIS — N401 Enlarged prostate with lower urinary tract symptoms: Secondary | ICD-10-CM

## 2018-05-27 DIAGNOSIS — D751 Secondary polycythemia: Secondary | ICD-10-CM | POA: Diagnosis not present

## 2018-05-27 DIAGNOSIS — I10 Essential (primary) hypertension: Secondary | ICD-10-CM | POA: Diagnosis not present

## 2018-05-27 DIAGNOSIS — D473 Essential (hemorrhagic) thrombocythemia: Secondary | ICD-10-CM

## 2018-05-27 DIAGNOSIS — R7989 Other specified abnormal findings of blood chemistry: Secondary | ICD-10-CM

## 2018-05-27 DIAGNOSIS — Z0000 Encounter for general adult medical examination without abnormal findings: Secondary | ICD-10-CM

## 2018-05-27 LAB — CBC WITH DIFFERENTIAL/PLATELET
Basophils Absolute: 0.1 10*3/uL (ref 0.0–0.1)
Basophils Relative: 0.9 % (ref 0.0–3.0)
EOS PCT: 2.2 % (ref 0.0–5.0)
Eosinophils Absolute: 0.1 10*3/uL (ref 0.0–0.7)
HCT: 46.4 % (ref 39.0–52.0)
Hemoglobin: 15.2 g/dL (ref 13.0–17.0)
Lymphocytes Relative: 24.6 % (ref 12.0–46.0)
Lymphs Abs: 1.6 10*3/uL (ref 0.7–4.0)
MCHC: 32.7 g/dL (ref 30.0–36.0)
MCV: 86.5 fl (ref 78.0–100.0)
MONO ABS: 0.6 10*3/uL (ref 0.1–1.0)
Monocytes Relative: 9.8 % (ref 3.0–12.0)
Neutro Abs: 3.9 10*3/uL (ref 1.4–7.7)
Neutrophils Relative %: 62.5 % (ref 43.0–77.0)
Platelets: 240 10*3/uL (ref 150.0–400.0)
RBC: 5.36 Mil/uL (ref 4.22–5.81)
RDW: 13.2 % (ref 11.5–15.5)
WBC: 6.3 10*3/uL (ref 4.0–10.5)

## 2018-05-27 LAB — COMPREHENSIVE METABOLIC PANEL
ALT: 25 U/L (ref 0–53)
AST: 34 U/L (ref 0–37)
Albumin: 4.6 g/dL (ref 3.5–5.2)
Alkaline Phosphatase: 64 U/L (ref 39–117)
BUN: 15 mg/dL (ref 6–23)
CO2: 28 mEq/L (ref 19–32)
Calcium: 9.5 mg/dL (ref 8.4–10.5)
Chloride: 103 mEq/L (ref 96–112)
Creatinine, Ser: 1.25 mg/dL (ref 0.40–1.50)
GFR: 58.74 mL/min — ABNORMAL LOW (ref >60.00)
Glucose, Bld: 73 mg/dL (ref 70–99)
POTASSIUM: 4.1 meq/L (ref 3.5–5.1)
Sodium: 139 mEq/L (ref 135–145)
TOTAL PROTEIN: 7.1 g/dL (ref 6.0–8.3)
Total Bilirubin: 1.3 mg/dL — ABNORMAL HIGH (ref 0.2–1.2)

## 2018-05-27 LAB — LIPID PANEL
Cholesterol: 210 mg/dL — ABNORMAL HIGH (ref 0–200)
HDL: 49.3 mg/dL (ref >39.00)
LDL Cholesterol: 136 mg/dL — ABNORMAL HIGH (ref 0–99)
NonHDL: 160.81
Total CHOL/HDL Ratio: 4
Triglycerides: 126 mg/dL (ref 0.0–149.0)
VLDL: 25.2 mg/dL (ref 0.0–40.0)

## 2018-05-27 LAB — LACTATE DEHYDROGENASE: LDH: 160 U/L (ref 120–250)

## 2018-05-27 LAB — FERRITIN: Ferritin: 171.8 ng/mL (ref 22.0–322.0)

## 2018-05-27 NOTE — Assessment & Plan Note (Signed)
Well controlled on low dose lisinopril. No changes made to rxs today.

## 2018-05-27 NOTE — Assessment & Plan Note (Signed)
Followed by urology.  On flomax.

## 2018-05-27 NOTE — Assessment & Plan Note (Signed)
Followed by urology.  On testosterone supplementation.

## 2018-05-27 NOTE — Assessment & Plan Note (Signed)
Due to androgen use.  Followed by hematology.

## 2018-05-27 NOTE — Patient Instructions (Signed)
Great to see you. I will call you with your lab results from today and you can view them online.   

## 2018-05-27 NOTE — Assessment & Plan Note (Addendum)
Followed by hematology, Dr. Julien Nordmann.  Was last seen and labs done on 03/04/18. He had phelobotomy prior.  So he wants labs recheck today.

## 2018-05-27 NOTE — Assessment & Plan Note (Signed)
Reviewed preventive care protocols, scheduled due services, and updated immunizations Discussed nutrition, exercise, diet, and healthy lifestyle.  

## 2018-06-03 ENCOUNTER — Inpatient Hospital Stay: Payer: 59

## 2018-06-03 ENCOUNTER — Telehealth: Payer: Self-pay | Admitting: Internal Medicine

## 2018-06-03 ENCOUNTER — Other Ambulatory Visit: Payer: Self-pay

## 2018-06-03 ENCOUNTER — Encounter: Payer: Self-pay | Admitting: Internal Medicine

## 2018-06-03 ENCOUNTER — Inpatient Hospital Stay: Payer: 59 | Attending: Internal Medicine | Admitting: Internal Medicine

## 2018-06-03 VITALS — BP 144/76 | HR 87 | Temp 97.9°F | Resp 20 | Ht 71.0 in | Wt 178.0 lb

## 2018-06-03 DIAGNOSIS — D473 Essential (hemorrhagic) thrombocythemia: Secondary | ICD-10-CM

## 2018-06-03 DIAGNOSIS — E611 Iron deficiency: Secondary | ICD-10-CM | POA: Insufficient documentation

## 2018-06-03 DIAGNOSIS — M25542 Pain in joints of left hand: Secondary | ICD-10-CM | POA: Diagnosis not present

## 2018-06-03 DIAGNOSIS — M25541 Pain in joints of right hand: Secondary | ICD-10-CM | POA: Insufficient documentation

## 2018-06-03 DIAGNOSIS — I10 Essential (primary) hypertension: Secondary | ICD-10-CM

## 2018-06-03 LAB — FERRITIN: Ferritin: 9 ng/mL — ABNORMAL LOW (ref 24–336)

## 2018-06-03 LAB — CMP (CANCER CENTER ONLY)
ALT: 16 U/L (ref 0–44)
AST: 20 U/L (ref 15–41)
Albumin: 3.8 g/dL (ref 3.5–5.0)
Alkaline Phosphatase: 76 U/L (ref 38–126)
Anion gap: 11 (ref 5–15)
BUN: 17 mg/dL (ref 6–20)
CO2: 26 mmol/L (ref 22–32)
Calcium: 8.7 mg/dL — ABNORMAL LOW (ref 8.9–10.3)
Chloride: 103 mmol/L (ref 98–111)
Creatinine: 1.15 mg/dL (ref 0.61–1.24)
GFR, Est AFR Am: >60 mL/min (ref >60)
GFR, Estimated: >60 mL/min (ref >60)
Glucose, Bld: 84 mg/dL (ref 70–99)
Potassium: 4.4 mmol/L (ref 3.5–5.1)
Sodium: 140 mmol/L (ref 135–145)
Total Bilirubin: 0.7 mg/dL (ref 0.3–1.2)
Total Protein: 6 g/dL — ABNORMAL LOW (ref 6.5–8.1)

## 2018-06-03 LAB — IRON AND TIBC
Iron: 28 ug/dL — ABNORMAL LOW (ref 42–163)
Saturation Ratios: 7 % — ABNORMAL LOW (ref 20–55)
TIBC: 405 ug/dL (ref 202–409)
UIBC: 378 ug/dL — ABNORMAL HIGH (ref 117–376)

## 2018-06-03 LAB — CBC WITH DIFFERENTIAL (CANCER CENTER ONLY)
Abs Immature Granulocytes: 0.03 10*3/uL (ref 0.00–0.07)
Basophils Absolute: 0.1 10*3/uL (ref 0.0–0.1)
Basophils Relative: 1 %
Eosinophils Absolute: 0.2 10*3/uL (ref 0.0–0.5)
Eosinophils Relative: 5 %
HCT: 45.8 % (ref 39.0–52.0)
Hemoglobin: 13.3 g/dL (ref 13.0–17.0)
Immature Granulocytes: 1 %
Lymphocytes Relative: 18 %
Lymphs Abs: 0.9 10*3/uL (ref 0.7–4.0)
MCH: 25.5 pg — ABNORMAL LOW (ref 26.0–34.0)
MCHC: 29 g/dL — ABNORMAL LOW (ref 30.0–36.0)
MCV: 87.7 fL (ref 80.0–100.0)
Monocytes Absolute: 0.6 10*3/uL (ref 0.1–1.0)
Monocytes Relative: 13 %
NRBC: 0 % (ref 0.0–0.2)
Neutro Abs: 3.2 10*3/uL (ref 1.7–7.7)
Neutrophils Relative %: 62 %
Platelet Count: 408 10*3/uL — ABNORMAL HIGH (ref 150–400)
RBC: 5.22 MIL/uL (ref 4.22–5.81)
RDW: 20 % — ABNORMAL HIGH (ref 11.5–15.5)
WBC Count: 5.1 10*3/uL (ref 4.0–10.5)

## 2018-06-03 NOTE — Telephone Encounter (Signed)
Scheduled appt per 3/11 los. ° °Printed calendar and avs. °

## 2018-06-03 NOTE — Progress Notes (Signed)
[facility] [phone]   [phone]  OFFICE PROGRESS NOTE  [place], [place] [id number]  DIAGNOSIS:  1) Essential thrombocythemia diagnosed in 2006  2) androgen-induced polycythemia.  PRIOR THERAPY: None   CURRENT THERAPY:  1) Hydroxyurea 500 mg by mouth daily except Friday he is on 1000 mg 2) phlebotomy on as-needed basis.  INTERVAL HISTORY: Dennis Martin 63 y.o. male returns to the clinic today for follow-up visit accompanied by his wife.  The patient is feeling fine today with no concerning complaints except for arthralgia of his hands.  He is scheduled to have surgery in the next few weeks.  He denied having any chest pain, shortness of breath, cough or hemoptysis.  He is recovering from bronchitis and feeling much better.  He denied having any recent weight loss or night sweats.  He has no nausea, vomiting, diarrhea or constipation.  The patient is here today for evaluation and repeat blood work.  MEDICAL HISTORY: Past Medical History:  Diagnosis Date  . Anxiety    hx of - 10 years ago  . Arthritis    both arthritis, wrist, hands   . BPH (benign prostatic hyperplasia)   . DVT of leg (deep venous thrombosis) ([place]) 2001   post knee arthroscopy,also complicated by infection, PICC line used for long term antibiotics, was on coumadin x's 6 months  . Essential thrombocytosis ([place])   . Headache    relative to testosterone level, it had been a problem, improved with clomid  . History of stress test 2008   done in [place] ,nuclear stress test- told that it was normal  . Hypertension   . Peripheral vascular disease ([place])   . Sleep apnea    did surgery for the problem  . Spleen enlarged   . Thrombocythemia ([place])    monitored by Dr. Earlie Server    ALLERGIES:  is allergic to keflex [cephalexin].  MEDICATIONS:  Current Outpatient Medications  Medication Sig Dispense Refill  . albuterol (PROVENTIL  HFA;VENTOLIN HFA) 108 (90 Base) MCG/ACT inhaler Inhale 2 puffs into the lungs every 6 (six) hours as needed for wheezing or shortness of breath. 1 Inhaler 0  . aspirin EC 81 MG tablet Take 81 mg by mouth 2 (two) times daily.    . calcium carbonate (TUMS EX) 750 MG chewable tablet Chew 1 tablet by mouth daily as needed for heartburn.    . desonide (DESOWEN) 0.05 % cream Apply 1 application topically 2 (two) times daily as needed (rash).    . folic acid (FOLVITE) 785 MCG tablet Take 400 mcg by mouth daily.    . hydrocortisone 2.5 % lotion Apply 1 application topically daily as needed (rash/itching).     . hydroxyurea (HYDREA) 500 MG capsule TAKE 1 CAPSULE DAILY EXCEPT TAKE 2 CAPSULES ON FRIDAYS 105 capsule 4  . ketoconazole (NIZORAL) 2 % cream Apply 1 application topically daily.    Marland Kitchen lisinopril (PRINIVIL,ZESTRIL) 10 MG tablet TAKE 1 TABLET DAILY 90 tablet 1  . NYSTATIN powder APPLY TOPICALLY FOUR TIMES DAILY 60 g 9  . OVER THE COUNTER MEDICATION Vitafusion men's complete vitamin gummies; pt chews two gummies daily.    . tamsulosin (FLOMAX) 0.4 MG CAPS capsule Take 0.4 mg by mouth at bedtime.     . Testosterone (AXIRON) 30 MG/ACT SOLN 60 mg by Pump Prime route daily.    Marland Kitchen triamcinolone cream (KENALOG) 0.1 % Apply 1 application topically 2 (  two) times daily as needed (skin irritation).     . meloxicam (MOBIC) 7.5 MG tablet      No current facility-administered medications for this visit.     SURGICAL HISTORY:  Past Surgical History:  Procedure Laterality Date  . EYE SURGERY     chalazion on both eyelids  . KNEE SURGERY Bilateral 2001  . NASAL SINUS SURGERY    . TONSILLECTOMY    . TOTAL KNEE ARTHROPLASTY Right 05/22/2015  . TOTAL KNEE ARTHROPLASTY Right 05/22/2015   Procedure: TOTAL KNEE ARTHROPLASTY;  Surgeon: Frederik Pear, MD;  Location: [place];  Service: Orthopedics;  Laterality: Right;  . TOTAL KNEE ARTHROPLASTY Left 07/17/2015  . TOTAL KNEE ARTHROPLASTY Left 07/17/2015   Procedure: TOTAL  KNEE ARTHROPLASTY;  Surgeon: Frederik Pear, MD;  Location: [place];  Service: Orthopedics;  Laterality: Left;  . ULNAR NERVE TRANSPOSITION  2007   left    REVIEW OF SYSTEMS:  A comprehensive review of systems was negative.   PHYSICAL EXAMINATION: General appearance: alert, cooperative and no distress Head: Normocephalic, without obvious abnormality, atraumatic Neck: no adenopathy Lymph nodes: Cervical, supraclavicular, and axillary nodes normal. Resp: clear to auscultation bilaterally Back: symmetric, no curvature. ROM normal. No CVA tenderness. Cardio: regular rate and rhythm, S1, S2 normal, no murmur, click, rub or gallop GI: soft, non-tender; bowel sounds normal; no masses,  no organomegaly Extremities: extremities normal, atraumatic, no cyanosis or edema  ECOG PERFORMANCE STATUS: 0 - Asymptomatic  Blood pressure (!) 144/76, pulse 87, temperature 97.9 F (36.6 C), temperature source Oral, resp. rate 20, height 5\' 11"  (1.803 m), weight 178 lb (80.7 kg), SpO2 99 %.  LABORATORY DATA: Lab Results  Component Value Date   WBC 5.1 06/03/2018   HGB 13.3 06/03/2018   HCT 45.8 06/03/2018   MCV 87.7 06/03/2018   PLT 408 (H) 06/03/2018      Chemistry      Component Value Date/Time   NA 140 06/03/2018 0856   NA 139 11/10/2013 1508   K 4.4 06/03/2018 0856   K 4.3 11/10/2013 1508   CL 103 06/03/2018 0856   CL 102 05/04/2012 1309   CO2 26 06/03/2018 0856   CO2 28 11/10/2013 1508   BUN 17 06/03/2018 0856   BUN 15.4 11/10/2013 1508   CREATININE 1.15 06/03/2018 0856   CREATININE 1.1 11/10/2013 1508      Component Value Date/Time   CALCIUM 8.7 (L) 06/03/2018 0856   CALCIUM 9.6 11/10/2013 1508   ALKPHOS 76 06/03/2018 0856   ALKPHOS 78 11/10/2013 1508   AST 20 06/03/2018 0856   AST 19 11/10/2013 1508   ALT 16 06/03/2018 0856   ALT 15 11/10/2013 1508   BILITOT 0.7 06/03/2018 0856   BILITOT 0.43 11/10/2013 1508       RADIOGRAPHIC STUDIES: No results found.  ASSESSMENT AND  PLAN: This is a very pleasant 63 years old white male with essential thrombocythemia currently on treatment with hydroxyurea 500 mg by mouth daily except Friday where the patient takes 1000 mg. He is tolerating his treatment fairly well and there is no significant evidence for disease progression on his recent blood count.  Platelets count slightly above the normal range was 408,000.  Hemoglobin and hematocrit are within the normal range. I recommended for the patient to continue his current treatment with hydroxyurea for now. For the iron deficiency, this is likely secondary to previous phlebotomies, I recommended for the patient to start over-the-counter oral iron tablets 1-2 tablets every day. I will  see him back for follow-up visit in 3 months for evaluation and repeat blood work. The patient was advised to call immediately if he has any concerning symptoms in the interval. The patient had a lot of questions and I spent a good amount of money with him today answering his questions and addressing his issues. The patient voices understanding of current disease status and treatment options and is in agreement with the current care plan.  All questions were answered. The patient knows to call the clinic with any problems, questions or concerns. We can certainly see the patient much sooner if necessary.   Disclaimer: This note was dictated with voice recognition software. Similar sounding words can inadvertently be transcribed and may be missed upon review.

## 2018-06-30 LAB — PSA: PSA: 0.78

## 2018-07-08 ENCOUNTER — Encounter: Payer: Self-pay | Admitting: Family Medicine

## 2018-07-08 NOTE — Progress Notes (Signed)
[facility]/thx dmf

## 2018-09-03 ENCOUNTER — Inpatient Hospital Stay: Payer: 59 | Attending: Internal Medicine

## 2018-09-03 ENCOUNTER — Other Ambulatory Visit: Payer: Self-pay

## 2018-09-03 ENCOUNTER — Encounter: Payer: Self-pay | Admitting: Internal Medicine

## 2018-09-03 ENCOUNTER — Inpatient Hospital Stay: Payer: 59 | Admitting: Internal Medicine

## 2018-09-03 ENCOUNTER — Inpatient Hospital Stay: Payer: 59

## 2018-09-03 ENCOUNTER — Other Ambulatory Visit: Payer: Self-pay | Admitting: *Deleted

## 2018-09-03 VITALS — BP 129/70 | HR 75 | Temp 98.9°F | Resp 18 | Ht 71.0 in | Wt 186.8 lb

## 2018-09-03 DIAGNOSIS — D751 Secondary polycythemia: Secondary | ICD-10-CM

## 2018-09-03 DIAGNOSIS — I10 Essential (primary) hypertension: Secondary | ICD-10-CM

## 2018-09-03 DIAGNOSIS — D473 Essential (hemorrhagic) thrombocythemia: Secondary | ICD-10-CM

## 2018-09-03 DIAGNOSIS — R161 Splenomegaly, not elsewhere classified: Secondary | ICD-10-CM

## 2018-09-03 DIAGNOSIS — R5383 Other fatigue: Secondary | ICD-10-CM | POA: Insufficient documentation

## 2018-09-03 LAB — CMP (CANCER CENTER ONLY)
ALT: 14 U/L (ref 0–44)
AST: 18 U/L (ref 15–41)
Albumin: 3.9 g/dL (ref 3.5–5.0)
Alkaline Phosphatase: 73 U/L (ref 38–126)
Anion gap: 9 (ref 5–15)
BUN: 20 mg/dL (ref 8–23)
CO2: 27 mmol/L (ref 22–32)
Calcium: 8.9 mg/dL (ref 8.9–10.3)
Chloride: 104 mmol/L (ref 98–111)
Creatinine: 1.02 mg/dL (ref 0.61–1.24)
GFR, Est AFR Am: >60 mL/min (ref >60)
GFR, Estimated: >60 mL/min (ref >60)
Glucose, Bld: 76 mg/dL (ref 70–99)
Potassium: 4.8 mmol/L (ref 3.5–5.1)
Sodium: 140 mmol/L (ref 135–145)
Total Bilirubin: 0.5 mg/dL (ref 0.3–1.2)
Total Protein: 5.9 g/dL — ABNORMAL LOW (ref 6.5–8.1)

## 2018-09-03 LAB — FERRITIN: Ferritin: 41 ng/mL (ref 24–336)

## 2018-09-03 LAB — IRON AND TIBC
Iron: 56 ug/dL (ref 42–163)
Saturation Ratios: 15 % — ABNORMAL LOW (ref 20–55)
TIBC: 374 ug/dL (ref 202–409)
UIBC: 318 ug/dL (ref 117–376)

## 2018-09-03 LAB — CBC WITH DIFFERENTIAL (CANCER CENTER ONLY)
Abs Immature Granulocytes: 0.03 10*3/uL (ref 0.00–0.07)
Basophils Absolute: 0.1 10*3/uL (ref 0.0–0.1)
Basophils Relative: 1 %
Eosinophils Absolute: 0.2 10*3/uL (ref 0.0–0.5)
Eosinophils Relative: 2 %
HCT: 53.4 % — ABNORMAL HIGH (ref 39.0–52.0)
Hemoglobin: 17.3 g/dL — ABNORMAL HIGH (ref 13.0–17.0)
Immature Granulocytes: 0 %
Lymphocytes Relative: 10 %
Lymphs Abs: 0.7 10*3/uL (ref 0.7–4.0)
MCH: 33.1 pg (ref 26.0–34.0)
MCHC: 32.4 g/dL (ref 30.0–36.0)
MCV: 102.3 fL — ABNORMAL HIGH (ref 80.0–100.0)
Monocytes Absolute: 0.7 10*3/uL (ref 0.1–1.0)
Monocytes Relative: 9 %
Neutro Abs: 5.5 10*3/uL (ref 1.7–7.7)
Neutrophils Relative %: 78 %
Platelet Count: 307 10*3/uL (ref 150–400)
RBC: 5.22 MIL/uL (ref 4.22–5.81)
RDW: 16.5 % — ABNORMAL HIGH (ref 11.5–15.5)
WBC Count: 7.1 10*3/uL (ref 4.0–10.5)
nRBC: 0 % (ref 0.0–0.2)

## 2018-09-03 LAB — LACTATE DEHYDROGENASE: LDH: 204 U/L — ABNORMAL HIGH (ref 98–192)

## 2018-09-03 NOTE — Progress Notes (Signed)
[name] presents today for phlebotomy per MD [facility] orders. Phlebotomy procedure started at 1000 and ended at 1004. 560 grams removed. Patient observed for 30 minutes after procedure without any incident.  Ate and drank afterwards. Patient tolerated procedure well.  VSS.  A&Ox4.  Ambulatory w/steady gait to exit with belongings. 16 G RAC IV needle removed intact.

## 2018-09-03 NOTE — Patient Instructions (Signed)

## 2018-09-03 NOTE — Progress Notes (Signed)
[facility] [phone]   [phone]  OFFICE PROGRESS NOTE  [place], [place] [address]  DIAGNOSIS:  1) Essential thrombocythemia diagnosed in 2006  2) androgen-induced polycythemia.  PRIOR THERAPY: None   CURRENT THERAPY:  1) Hydroxyurea 500 mg by mouth daily except Friday he is on 1000 mg 2) phlebotomy on as-needed basis.  INTERVAL HISTORY: Dennis Martin 63 y.o. male returns to the clinic today for follow-up visit.  The patient feeling fine today with no concerning complaints except for mild fatigue.  He also has some bruises behind the left knee several weeks ago and that completely resolved.  He has a history of Baker's cyst in the same area in the past.  He denied having any current chest pain, shortness of breath, cough or hemoptysis.  He denied having any fever or chills.  He has no nausea, vomiting, diarrhea or constipation.  He has no headache or visual changes.  The patient is here today for evaluation and repeat blood work for monitoring of his condition.   MEDICAL HISTORY: Past Medical History:  Diagnosis Date  . Anxiety    hx of - 10 years ago  . Arthritis    both arthritis, wrist, hands   . BPH (benign prostatic hyperplasia)   . DVT of leg (deep venous thrombosis) ([facility]) 2001   post knee arthroscopy,also complicated by infection, PICC line used for long term antibiotics, was on coumadin x's 6 months  . Essential thrombocytosis ([facility])   . Headache    relative to testosterone level, it had been a problem, improved with clomid  . History of stress test 2008   done in [place] ,nuclear stress test- told that it was normal  . Hypertension   . Peripheral vascular disease ([facility])   . Sleep apnea    did surgery for the problem  . Spleen enlarged   . Thrombocythemia ([facility])    monitored by Dr. Earlie Server    ALLERGIES:  is allergic to keflex [cephalexin].  MEDICATIONS:  Current Outpatient  Medications  Medication Sig Dispense Refill  . ferrous sulfate 325 (65 FE) MG EC tablet Take 325 mg by mouth daily with breakfast.    . Iron-Vit C-Vit B12-Folic Acid (IRON 500 PLUS PO) Take by mouth.    Marland Kitchen albuterol (PROVENTIL HFA;VENTOLIN HFA) 108 (90 Base) MCG/ACT inhaler Inhale 2 puffs into the lungs every 6 (six) hours as needed for wheezing or shortness of breath. 1 Inhaler 0  . aspirin EC 81 MG tablet Take 81 mg by mouth 2 (two) times daily.    . calcium carbonate (TUMS EX) 750 MG chewable tablet Chew 1 tablet by mouth daily as needed for heartburn.    . desonide (DESOWEN) 0.05 % cream Apply 1 application topically 2 (two) times daily as needed (rash).    . folic acid (FOLVITE) 938 MCG tablet Take 400 mcg by mouth daily.    . hydrocortisone 2.5 % lotion Apply 1 application topically daily as needed (rash/itching).     . hydroxyurea (HYDREA) 500 MG capsule TAKE 1 CAPSULE DAILY EXCEPT TAKE 2 CAPSULES ON FRIDAYS 105 capsule 4  . ketoconazole (NIZORAL) 2 % cream Apply 1 application topically daily.    Marland Kitchen lisinopril (PRINIVIL,ZESTRIL) 10 MG tablet TAKE 1 TABLET DAILY 90 tablet 1  . meloxicam (MOBIC) 7.5 MG tablet     . NYSTATIN powder APPLY TOPICALLY FOUR TIMES DAILY 60 g 9  . OVER THE  COUNTER MEDICATION Vitafusion men's complete vitamin gummies; pt chews two gummies daily.    . tamsulosin (FLOMAX) 0.4 MG CAPS capsule Take 0.4 mg by mouth at bedtime.     . Testosterone (AXIRON) 30 MG/ACT SOLN 60 mg by Pump Prime route daily.    Marland Kitchen triamcinolone cream (KENALOG) 0.1 % Apply 1 application topically 2 (two) times daily as needed (skin irritation).      No current facility-administered medications for this visit.     SURGICAL HISTORY:  Past Surgical History:  Procedure Laterality Date  . EYE SURGERY     chalazion on both eyelids  . KNEE SURGERY Bilateral 2001  . NASAL SINUS SURGERY    . TONSILLECTOMY    . TOTAL KNEE ARTHROPLASTY Right 05/22/2015  . TOTAL KNEE ARTHROPLASTY Right 05/22/2015    Procedure: TOTAL KNEE ARTHROPLASTY;  Surgeon: Frederik Pear, MD;  Location: [facility];  Service: Orthopedics;  Laterality: Right;  . TOTAL KNEE ARTHROPLASTY Left 07/17/2015  . TOTAL KNEE ARTHROPLASTY Left 07/17/2015   Procedure: TOTAL KNEE ARTHROPLASTY;  Surgeon: Frederik Pear, MD;  Location: [facility];  Service: Orthopedics;  Laterality: Left;  . ULNAR NERVE TRANSPOSITION  2007   left    REVIEW OF SYSTEMS:  A comprehensive review of systems was negative except for: Constitutional: positive for fatigue   PHYSICAL EXAMINATION: General appearance: alert, cooperative, fatigued and no distress Head: Normocephalic, without obvious abnormality, atraumatic Neck: no adenopathy Lymph nodes: Cervical, supraclavicular, and axillary nodes normal. Resp: clear to auscultation bilaterally Back: symmetric, no curvature. ROM normal. No CVA tenderness. Cardio: regular rate and rhythm, S1, S2 normal, no murmur, click, rub or gallop GI: soft, non-tender; bowel sounds normal; no masses,  no organomegaly Extremities: extremities normal, atraumatic, no cyanosis or edema  ECOG PERFORMANCE STATUS: 1 - Symptomatic but completely ambulatory  Blood pressure 129/70, pulse 75, temperature 98.9 F (37.2 C), temperature source Oral, resp. rate 18, height 5\' 11"  (1.803 m), weight 186 lb 12.8 oz (84.7 kg).  LABORATORY DATA: Lab Results  Component Value Date   WBC 7.1 09/03/2018   HGB 17.3 (H) 09/03/2018   HCT 53.4 (H) 09/03/2018   MCV 102.3 (H) 09/03/2018   PLT 307 09/03/2018      Chemistry      Component Value Date/Time   NA 140 06/03/2018 0856   NA 139 11/10/2013 1508   K 4.4 06/03/2018 0856   K 4.3 11/10/2013 1508   CL 103 06/03/2018 0856   CL 102 05/04/2012 1309   CO2 26 06/03/2018 0856   CO2 28 11/10/2013 1508   BUN 17 06/03/2018 0856   BUN 15.4 11/10/2013 1508   CREATININE 1.15 06/03/2018 0856   CREATININE 1.1 11/10/2013 1508      Component Value Date/Time   CALCIUM 8.7 (L) 06/03/2018 0856   CALCIUM 9.6  11/10/2013 1508   ALKPHOS 76 06/03/2018 0856   ALKPHOS 78 11/10/2013 1508   AST 20 06/03/2018 0856   AST 19 11/10/2013 1508   ALT 16 06/03/2018 0856   ALT 15 11/10/2013 1508   BILITOT 0.7 06/03/2018 0856   BILITOT 0.43 11/10/2013 1508       RADIOGRAPHIC STUDIES: No results found.  ASSESSMENT AND PLAN: This is a very pleasant 63 years old white male with essential thrombocythemia currently on treatment with hydroxyurea 500 mg by mouth daily except Friday where the patient takes 1000 mg. He is tolerating his treatment fairly well and there is no significant evidence for disease progression on his recent blood count.  His platelet counts are within the normal range today. For the reactive polycythemia secondary to hormonal treatment, will arrange for the patient to have a phlebotomy today. I will see him back for follow-up visit in 2 months for evaluation and repeat blood work and phlebotomy if needed. He was advised to call immediately if he has any concerning symptoms in the interval. The patient had a lot of questions and I spent a good amount of money with him today answering his questions and addressing his issues. The patient voices understanding of current disease status and treatment options and is in agreement with the current care plan.  All questions were answered. The patient knows to call the clinic with any problems, questions or concerns. We can certainly see the patient much sooner if necessary.   Disclaimer: This note was dictated with voice recognition software. Similar sounding words can inadvertently be transcribed and may be missed upon review.

## 2018-09-07 ENCOUNTER — Telehealth: Payer: Self-pay | Admitting: Internal Medicine

## 2018-09-07 NOTE — Telephone Encounter (Signed)
Scheduled appt per 6/11 los - mailed letter with appt date and time   

## 2018-10-31 ENCOUNTER — Other Ambulatory Visit: Payer: Self-pay | Admitting: Family Medicine

## 2018-11-05 ENCOUNTER — Other Ambulatory Visit: Payer: Self-pay

## 2018-11-05 ENCOUNTER — Telehealth: Payer: Self-pay | Admitting: Internal Medicine

## 2018-11-05 ENCOUNTER — Inpatient Hospital Stay: Payer: 59 | Attending: Internal Medicine | Admitting: Internal Medicine

## 2018-11-05 ENCOUNTER — Encounter: Payer: Self-pay | Admitting: Internal Medicine

## 2018-11-05 ENCOUNTER — Inpatient Hospital Stay: Payer: 59

## 2018-11-05 VITALS — BP 134/80 | HR 75 | Temp 98.3°F | Resp 18 | Ht 71.0 in | Wt 188.6 lb

## 2018-11-05 DIAGNOSIS — R7989 Other specified abnormal findings of blood chemistry: Secondary | ICD-10-CM

## 2018-11-05 DIAGNOSIS — D473 Essential (hemorrhagic) thrombocythemia: Secondary | ICD-10-CM | POA: Insufficient documentation

## 2018-11-05 DIAGNOSIS — R161 Splenomegaly, not elsewhere classified: Secondary | ICD-10-CM

## 2018-11-05 DIAGNOSIS — D751 Secondary polycythemia: Secondary | ICD-10-CM

## 2018-11-05 LAB — CBC WITH DIFFERENTIAL (CANCER CENTER ONLY)
Abs Immature Granulocytes: 0.04 10*3/uL (ref 0.00–0.07)
Basophils Absolute: 0.1 10*3/uL (ref 0.0–0.1)
Basophils Relative: 1 %
Eosinophils Absolute: 0.2 10*3/uL (ref 0.0–0.5)
Eosinophils Relative: 2 %
HCT: 55.1 % — ABNORMAL HIGH (ref 39.0–52.0)
Hemoglobin: 18.6 g/dL — ABNORMAL HIGH (ref 13.0–17.0)
Immature Granulocytes: 1 %
Lymphocytes Relative: 11 %
Lymphs Abs: 0.8 10*3/uL (ref 0.7–4.0)
MCH: 36.3 pg — ABNORMAL HIGH (ref 26.0–34.0)
MCHC: 33.8 g/dL (ref 30.0–36.0)
MCV: 107.4 fL — ABNORMAL HIGH (ref 80.0–100.0)
Monocytes Absolute: 0.7 10*3/uL (ref 0.1–1.0)
Monocytes Relative: 11 %
Neutro Abs: 5.2 10*3/uL (ref 1.7–7.7)
Neutrophils Relative %: 74 %
Platelet Count: 340 10*3/uL (ref 150–400)
RBC: 5.13 MIL/uL (ref 4.22–5.81)
RDW: 12.9 % (ref 11.5–15.5)
WBC Count: 7 10*3/uL (ref 4.0–10.5)
nRBC: 0 % (ref 0.0–0.2)

## 2018-11-05 LAB — IRON AND TIBC
Iron: 83 ug/dL (ref 42–163)
Saturation Ratios: 25 % (ref 20–55)
TIBC: 336 ug/dL (ref 202–409)
UIBC: 254 ug/dL (ref 117–376)

## 2018-11-05 LAB — FERRITIN: Ferritin: 55 ng/mL (ref 24–336)

## 2018-11-05 NOTE — Progress Notes (Signed)
[facility] [phone]   Fax:([phone]  OFFICE PROGRESS NOTE  [place], [place] [address]  DIAGNOSIS:  1) Essential thrombocythemia diagnosed in 2006  2) androgen-induced polycythemia.  PRIOR THERAPY: None   CURRENT THERAPY:  1) Hydroxyurea 500 mg by mouth daily except Friday he is on 1000 mg 2) phlebotomy on as-needed basis.  INTERVAL HISTORY: Dennis Martin 63 y.o. male returns to the clinic today for follow-up visit.  The patient is feeling fine today with no concerning complaints except for occasional fatigue.  He is also has some mild depression since he started working from home during the COVID-19 pandemic.  He denied having any chest pain, shortness of breath, cough or hemoptysis.  He denied having any fever or chills.  He has no nausea, vomiting, diarrhea or constipation.  She denied having any headache or visual changes.  He was on oral iron tablets because of the iron deficiency.  He has repeat CBC, iron study and ferritin performed earlier today and he is here for evaluation and discussion of his lab results.   MEDICAL HISTORY: Past Medical History:  Diagnosis Date  . Anxiety    hx of - 10 years ago  . Arthritis    both arthritis, wrist, hands   . BPH (benign prostatic hyperplasia)   . DVT of leg (deep venous thrombosis) ([facility]) 2001   post knee arthroscopy,also complicated by infection, PICC line used for long term antibiotics, was on coumadin x's 6 months  . Essential thrombocytosis ([facility])   . Headache    relative to testosterone level, it had been a problem, improved with clomid  . History of stress test 2008   done in [place] ,nuclear stress test- told that it was normal  . Hypertension   . Peripheral vascular disease ([facility])   . Sleep apnea    did surgery for the problem  . Spleen enlarged   . Thrombocythemia ([facility])    monitored by Dr. Earlie Server    ALLERGIES:  is allergic to keflex  [cephalexin].  MEDICATIONS:  Current Outpatient Medications  Medication Sig Dispense Refill  . aspirin EC 81 MG tablet Take 81 mg by mouth 2 (two) times daily.    . calcium carbonate (TUMS EX) 750 MG chewable tablet Chew 1 tablet by mouth daily as needed for heartburn.    . ferrous sulfate 325 (65 FE) MG EC tablet Take 400 mg by mouth daily with breakfast.     . folic acid (FOLVITE) 347 MCG tablet Take 400 mcg by mouth daily.    . hydroxyurea (HYDREA) 500 MG capsule TAKE 1 CAPSULE DAILY EXCEPT TAKE 2 CAPSULES ON FRIDAYS 105 capsule 4  . ketoconazole (NIZORAL) 2 % cream Apply 1 application topically daily.    Marland Kitchen lisinopril (ZESTRIL) 10 MG tablet TAKE 1 TABLET DAILY 90 tablet 1  . meloxicam (MOBIC) 7.5 MG tablet     . NYSTATIN powder APPLY TOPICALLY FOUR TIMES DAILY 60 g 9  . OVER THE COUNTER MEDICATION Vitafusion men's complete vitamin gummies; pt chews two gummies daily.    . tamsulosin (FLOMAX) 0.4 MG CAPS capsule 0.4 mg. 2 tablets a day at bedtime    . Testosterone (AXIRON) 30 MG/ACT SOLN 60 mg by Pump Prime route daily.     No current facility-administered medications for this visit.     SURGICAL HISTORY:  Past Surgical History:  Procedure Laterality Date  . EYE SURGERY  chalazion on both eyelids  . KNEE SURGERY Bilateral 2001  . NASAL SINUS SURGERY    . TONSILLECTOMY    . TOTAL KNEE ARTHROPLASTY Right 05/22/2015  . TOTAL KNEE ARTHROPLASTY Right 05/22/2015   Procedure: TOTAL KNEE ARTHROPLASTY;  Surgeon: Frederik Pear, MD;  Location: [facility];  Service: Orthopedics;  Laterality: Right;  . TOTAL KNEE ARTHROPLASTY Left 07/17/2015  . TOTAL KNEE ARTHROPLASTY Left 07/17/2015   Procedure: TOTAL KNEE ARTHROPLASTY;  Surgeon: Frederik Pear, MD;  Location: [facility];  Service: Orthopedics;  Laterality: Left;  . ULNAR NERVE TRANSPOSITION  2007   left    REVIEW OF SYSTEMS:  A comprehensive review of systems was negative except for: Constitutional: positive for fatigue   PHYSICAL EXAMINATION:  General appearance: alert, cooperative, fatigued and no distress Head: Normocephalic, without obvious abnormality, atraumatic Neck: no adenopathy Lymph nodes: Cervical, supraclavicular, and axillary nodes normal. Resp: clear to auscultation bilaterally Back: symmetric, no curvature. ROM normal. No CVA tenderness. Cardio: regular rate and rhythm, S1, S2 normal, no murmur, click, rub or gallop GI: soft, non-tender; bowel sounds normal; no masses,  no organomegaly Extremities: extremities normal, atraumatic, no cyanosis or edema  ECOG PERFORMANCE STATUS: 1 - Symptomatic but completely ambulatory  Blood pressure 134/80, pulse 75, temperature 98.3 F (36.8 C), temperature source Oral, resp. rate 18, height 5\' 11"  (1.803 m), weight 188 lb 9.6 oz (85.5 kg), SpO2 98 %.  LABORATORY DATA: Lab Results  Component Value Date   WBC 7.0 11/05/2018   HGB 18.6 (H) 11/05/2018   HCT 55.1 (H) 11/05/2018   MCV 107.4 (H) 11/05/2018   PLT 340 11/05/2018      Chemistry      Component Value Date/Time   NA 140 09/03/2018 0839   NA 139 11/10/2013 1508   K 4.8 09/03/2018 0839   K 4.3 11/10/2013 1508   CL 104 09/03/2018 0839   CL 102 05/04/2012 1309   CO2 27 09/03/2018 0839   CO2 28 11/10/2013 1508   BUN 20 09/03/2018 0839   BUN 15.4 11/10/2013 1508   CREATININE 1.02 09/03/2018 0839   CREATININE 1.1 11/10/2013 1508      Component Value Date/Time   CALCIUM 8.9 09/03/2018 0839   CALCIUM 9.6 11/10/2013 1508   ALKPHOS 73 09/03/2018 0839   ALKPHOS 78 11/10/2013 1508   AST 18 09/03/2018 0839   AST 19 11/10/2013 1508   ALT 14 09/03/2018 0839   ALT 15 11/10/2013 1508   BILITOT 0.5 09/03/2018 0839   BILITOT 0.43 11/10/2013 1508       RADIOGRAPHIC STUDIES: No results found.  ASSESSMENT AND PLAN: This is a very pleasant 63 years old white male with essential thrombocythemia currently on treatment with hydroxyurea 500 mg by mouth daily except Friday where the patient takes 1000 mg. He is tolerating  his treatment fairly well and there is no significant evidence for disease progression on his recent blood count.  He has normal platelets count today. For the reactive polycythemia secondary to androgen treatment, will arrange for the patient to have phlebotomy tomorrow.  He will also have phlebotomy and follow-up visit in 2 months.  I advised the patient to discontinue the oral iron tablets. He was advised to call immediately if he has any concerning symptoms in the interval. The patient had a lot of questions and I spent a good amount of money with him today answering his questions and addressing his issues. The patient voices understanding of current disease status and treatment options and is  in agreement with the current care plan.  All questions were answered. The patient knows to call the clinic with any problems, questions or concerns. We can certainly see the patient much sooner if necessary.   Disclaimer: This note was dictated with voice recognition software. Similar sounding words can inadvertently be transcribed and may be missed upon review.

## 2018-11-05 NOTE — Telephone Encounter (Signed)
Scheduled appt per 8/13 los - gave patient avs and calender per los.

## 2018-11-06 ENCOUNTER — Other Ambulatory Visit: Payer: Self-pay

## 2018-11-06 ENCOUNTER — Inpatient Hospital Stay: Payer: 59

## 2018-11-06 VITALS — BP 136/79 | HR 68 | Temp 98.2°F | Resp 18

## 2018-11-06 DIAGNOSIS — D473 Essential (hemorrhagic) thrombocythemia: Secondary | ICD-10-CM

## 2018-11-06 DIAGNOSIS — D751 Secondary polycythemia: Secondary | ICD-10-CM | POA: Diagnosis not present

## 2018-11-06 NOTE — Patient Instructions (Signed)

## 2018-11-06 NOTE — Progress Notes (Signed)
Phlebotomy performed in right antecubital with 18G x 1.16 in. angiocath without difficulty.  558 gm of blood obtained and wasted.  Pt tolerated procedure without complaints.  Beverage given.  0822  -  Pt was stable at discharge via self ambulatory.

## 2018-11-25 ENCOUNTER — Telehealth: Payer: Self-pay | Admitting: Family Medicine

## 2018-11-25 NOTE — Telephone Encounter (Signed)

## 2018-11-26 ENCOUNTER — Ambulatory Visit (INDEPENDENT_AMBULATORY_CARE_PROVIDER_SITE_OTHER): Payer: 59

## 2018-11-26 ENCOUNTER — Other Ambulatory Visit: Payer: Self-pay

## 2018-11-26 DIAGNOSIS — Z23 Encounter for immunization: Secondary | ICD-10-CM

## 2018-11-26 NOTE — Progress Notes (Signed)
Pt came in to get flu shot today. Given injection on the right deltoid and pt tolerate injection well. Information given to the pt.

## 2018-12-23 ENCOUNTER — Encounter: Payer: Self-pay | Admitting: Internal Medicine

## 2018-12-25 ENCOUNTER — Encounter: Payer: Self-pay | Admitting: Internal Medicine

## 2018-12-25 ENCOUNTER — Ambulatory Visit: Payer: 59 | Admitting: Internal Medicine

## 2018-12-25 ENCOUNTER — Other Ambulatory Visit: Payer: Self-pay

## 2018-12-25 VITALS — BP 124/62 | HR 69 | Temp 97.4°F | Ht 72.0 in | Wt 182.6 lb

## 2018-12-25 DIAGNOSIS — J209 Acute bronchitis, unspecified: Secondary | ICD-10-CM | POA: Diagnosis not present

## 2018-12-25 DIAGNOSIS — G4733 Obstructive sleep apnea (adult) (pediatric): Secondary | ICD-10-CM

## 2018-12-25 DIAGNOSIS — U071 COVID-19: Secondary | ICD-10-CM

## 2018-12-25 NOTE — Assessment & Plan Note (Signed)
This was probably garden-variety viral syndrome last winter. We discussed Covid syndrome, pending vaccine. He was interested in antibody check to see if he possibly had Covid then, before testing was available. Often feel somewhat tired.  Plan- Covid antibody test- small possibility of infection late last winter.

## 2018-12-25 NOTE — Assessment & Plan Note (Signed)
Benefits from CPAP with good compliance and control Thi machine is about 61 years old and working well. Plan- continue auto 5-15

## 2018-12-25 NOTE — Progress Notes (Signed)
HPI M former smoker followed for OSA, complicated by HBP Hx UPPP Unattended home sleep test 08/27/15- AHI 26.6/hour, desaturation to 78%, body weight 177 pounds ---------------------------------------------------------------------------------------------  12/22/2017- 63 year old male followed for OSA. Original diagnosis in the 1990s, treated with UPPP, complicated by BPH, DVT, thrombocytosis/splenomegaly. .  CPAP auto 5-15/[facility] -----OSA: DME [facility]. Pt wears CPAP most every night-unless he has a cold. DL attached; pressure works well and no new supplies needed at this time.  He reports unable to use his CPAP for a few nights recently with a flulike syndrome but overall he sleeps better with CPAP. Download was reviewed with him showing 83% compliance AHI 1.3/hour.  He uses nasal pillows.  Some dry mouth which we discussed.  12/25/2018-  63 year old male followed for OSA. Original diagnosis in the 1990s, treated with UPPP, complicated by BPH, DVT, thrombocytosis/splenomegaly/ phlebotomy. .  CPAP auto 5-15/ [facility] ------OSA on CPAP, DME: [facility]; no complaints Body weight today 182 lbs Download compliance 100%, AHI 0.5/ hr Comfortable with CPAP and no concerns expressed. We discussed life-expectancy of CPAP machine and guidelines for replacement when needed. He had a viral syndrome treated by PCP in February. We discussed Covid precautions, anticipated vaccine and need to wait till specific guidelines are available. He was interested in having Covid antibody test to see if the February illness might have been Covid, understanding it wouldn't change management now. Unknown currently how a positive test would affect advice about any future vaccine.   ROS-see HPI   + = positive Constitutional:    weight loss, night sweats, fevers, chills,+ fatigue, lassitude. HEENT:    headaches, difficulty swallowing, tooth/dental problems, sore throat,       sneezing, itching, ear ache, nasal congestion, post nasal  drip, snoring CV:    chest pain, orthopnea, PND, swelling in lower extremities, anasarca,                                                  dizziness, palpitations Resp:   shortness of breath with exertion or at rest.                productive cough,   non-productive cough, coughing up of blood.              change in color of mucus.  wheezing.   Skin:    rash or lesions. GI:  No-   heartburn, indigestion, abdominal pain, nausea, vomiting, diarrhea,                 change in bowel habits, loss of appetite GU: dysuria, change in color of urine, no urgency or frequency.   flank pain. MS:   +joint pain, stiffness, decreased range of motion, back pain. Neuro-     nothing unusual Psych:  change in mood or affect.  depression or anxiety.   memory loss.  OBJ- Physical Exam General- Alert, Oriented, Affect-appropriate, Distress- none acute, +lean/ fit appearing Skin- rash-none, lesions- none, excoriation- none Lymphadenopathy- none Head- atraumatic            Eyes- Gross vision intact, PERRLA, conjunctivae and secretions clear            Ears- Hearing, canals-normal            Nose- Clear, no-Septal dev, mucus, polyps, erosion, perforation  Throat- Mallampati II/ UPPP , mucosa clear- not dry , drainage- none, tonsils- atrophic Neck- flexible , trachea midline, no stridor , thyroid nl, carotid no bruit Chest - symmetrical excursion , unlabored           Heart/CV- RRR , no murmur , no gallop  , no rub, nl s1 s2                           - JVD- none , edema- none, stasis changes- none, varices- none           Lung- clear to P&A, wheeze- none, cough- none , dullness-none, rub- none           Chest wall-  Abd-  Br/ Gen/ Rectal- Not done, not indicated Extrem- cyanosis- none, clubbing, none, atrophy- none, strength- nl Neuro- grossly intact to observation

## 2018-12-25 NOTE — Patient Instructions (Addendum)
We can continue CPAP auto 5-15, mask of choice, humidifier, supplies, [employer]  Order- Covid antibody test      Possible prior infection 8 months ago  Please call if we can help

## 2018-12-26 LAB — SAR COV2 SEROLOGY (COVID19)AB(IGG),IA: SARS CoV2 AB IGG: NEGATIVE

## 2018-12-28 NOTE — Progress Notes (Signed)
Pt aware covid antibody neg. Nothing further needed.

## 2019-01-05 ENCOUNTER — Inpatient Hospital Stay: Payer: 59

## 2019-01-05 ENCOUNTER — Other Ambulatory Visit: Payer: Self-pay

## 2019-01-05 ENCOUNTER — Encounter: Payer: Self-pay | Admitting: Internal Medicine

## 2019-01-05 ENCOUNTER — Inpatient Hospital Stay: Payer: 59 | Attending: Internal Medicine | Admitting: Internal Medicine

## 2019-01-05 VITALS — BP 151/78 | HR 77 | Temp 97.8°F | Resp 18 | Ht 72.0 in | Wt 182.3 lb

## 2019-01-05 DIAGNOSIS — D473 Essential (hemorrhagic) thrombocythemia: Secondary | ICD-10-CM | POA: Diagnosis not present

## 2019-01-05 DIAGNOSIS — D751 Secondary polycythemia: Secondary | ICD-10-CM | POA: Diagnosis not present

## 2019-01-05 DIAGNOSIS — I10 Essential (primary) hypertension: Secondary | ICD-10-CM | POA: Diagnosis not present

## 2019-01-05 LAB — CMP (CANCER CENTER ONLY)
ALT: 15 U/L (ref 0–44)
AST: 17 U/L (ref 15–41)
Albumin: 4 g/dL (ref 3.5–5.0)
Alkaline Phosphatase: 74 U/L (ref 38–126)
Anion gap: 10 (ref 5–15)
BUN: 19 mg/dL (ref 8–23)
CO2: 24 mmol/L (ref 22–32)
Calcium: 8.7 mg/dL — ABNORMAL LOW (ref 8.9–10.3)
Chloride: 104 mmol/L (ref 98–111)
Creatinine: 1.04 mg/dL (ref 0.61–1.24)
GFR, Est AFR Am: >60 mL/min (ref >60)
GFR, Estimated: >60 mL/min (ref >60)
Glucose, Bld: 87 mg/dL (ref 70–99)
Potassium: 4.1 mmol/L (ref 3.5–5.1)
Sodium: 138 mmol/L (ref 135–145)
Total Bilirubin: 1 mg/dL (ref 0.3–1.2)
Total Protein: 5.9 g/dL — ABNORMAL LOW (ref 6.5–8.1)

## 2019-01-05 LAB — CBC WITH DIFFERENTIAL (CANCER CENTER ONLY)
Abs Immature Granulocytes: 0.06 10*3/uL (ref 0.00–0.07)
Basophils Absolute: 0.1 10*3/uL (ref 0.0–0.1)
Basophils Relative: 1 %
Eosinophils Absolute: 0.1 10*3/uL (ref 0.0–0.5)
Eosinophils Relative: 2 %
HCT: 54.1 % — ABNORMAL HIGH (ref 39.0–52.0)
Hemoglobin: 18.4 g/dL — ABNORMAL HIGH (ref 13.0–17.0)
Immature Granulocytes: 1 %
Lymphocytes Relative: 11 %
Lymphs Abs: 1 10*3/uL (ref 0.7–4.0)
MCH: 34.5 pg — ABNORMAL HIGH (ref 26.0–34.0)
MCHC: 34 g/dL (ref 30.0–36.0)
MCV: 101.3 fL — ABNORMAL HIGH (ref 80.0–100.0)
Monocytes Absolute: 1 10*3/uL (ref 0.1–1.0)
Monocytes Relative: 11 %
Neutro Abs: 6.6 10*3/uL (ref 1.7–7.7)
Neutrophils Relative %: 74 %
Platelet Count: 350 10*3/uL (ref 150–400)
RBC: 5.34 MIL/uL (ref 4.22–5.81)
RDW: 11.9 % (ref 11.5–15.5)
WBC Count: 8.8 10*3/uL (ref 4.0–10.5)
nRBC: 0 % (ref 0.0–0.2)

## 2019-01-05 LAB — IRON AND TIBC
Iron: 90 ug/dL (ref 42–163)
Saturation Ratios: 22 % (ref 20–55)
TIBC: 406 ug/dL (ref 202–409)
UIBC: 316 ug/dL (ref 117–376)

## 2019-01-05 LAB — FERRITIN: Ferritin: 25 ng/mL (ref 24–336)

## 2019-01-05 LAB — LACTATE DEHYDROGENASE: LDH: 196 U/L — ABNORMAL HIGH (ref 98–192)

## 2019-01-05 NOTE — Progress Notes (Signed)
[facility] [phone]   [phone]  OFFICE PROGRESS NOTE  [place], [place] [address]  DIAGNOSIS:  1) Essential thrombocythemia diagnosed in 2006  2) androgen-induced polycythemia.  PRIOR THERAPY: None   CURRENT THERAPY:  1) Hydroxyurea 500 mg by mouth daily except Friday he is on 1000 mg 2) phlebotomy on as-needed basis.  INTERVAL HISTORY: Dennis Martin 63 y.o. male returns to the clinic today for follow-up visit.  The patient is feeling fine today with no concerning complaints except for fatigue as well as headache and congestion in his head.  He is currently under a lot of stress of changing jobs.  He is worried about his insurance coverage in the next few months.  He denied having any current chest pain, shortness breath, cough or hemoptysis.  He denied having any fever or chills.  He has no nausea, vomiting, diarrhea or constipation.  He is here today for evaluation with repeat blood work and phlebotomy.   MEDICAL HISTORY: Past Medical History:  Diagnosis Date  . Anxiety    hx of - 10 years ago  . Arthritis    both arthritis, wrist, hands   . BPH (benign prostatic hyperplasia)   . DVT of leg (deep venous thrombosis) ([facility]) 2001   post knee arthroscopy,also complicated by infection, PICC line used for long term antibiotics, was on coumadin x's 6 months  . Essential thrombocytosis ([facility])   . Headache    relative to testosterone level, it had been a problem, improved with clomid  . History of stress test 2008   done in [place] ,nuclear stress test- told that it was normal  . Hypertension   . Peripheral vascular disease ([facility])   . Sleep apnea    did surgery for the problem  . Spleen enlarged   . Thrombocythemia ([facility])    monitored by Dr. Earlie Server    ALLERGIES:  is allergic to keflex [cephalexin].  MEDICATIONS:  Current Outpatient Medications  Medication Sig Dispense Refill  . aspirin EC  81 MG tablet Take 81 mg by mouth 2 (two) times daily.    . calcium carbonate (TUMS EX) 750 MG chewable tablet Chew 1 tablet by mouth daily as needed for heartburn.    . folic acid (FOLVITE) A999333 MCG tablet Take 400 mcg by mouth daily.    . hydroxyurea (HYDREA) 500 MG capsule TAKE 1 CAPSULE DAILY EXCEPT TAKE 2 CAPSULES ON FRIDAYS 105 capsule 4  . ketoconazole (NIZORAL) 2 % cream Apply 1 application topically daily.    Marland Kitchen lisinopril (ZESTRIL) 10 MG tablet TAKE 1 TABLET DAILY 90 tablet 1  . NYSTATIN powder APPLY TOPICALLY FOUR TIMES DAILY 60 g 9  . OVER THE COUNTER MEDICATION Vitafusion men's complete vitamin gummies; pt chews two gummies daily.    . tamsulosin (FLOMAX) 0.4 MG CAPS capsule 0.4 mg. 2 tablets a day at bedtime    . Testosterone (AXIRON) 30 MG/ACT SOLN 60 mg by Pump Prime route daily.     No current facility-administered medications for this visit.     SURGICAL HISTORY:  Past Surgical History:  Procedure Laterality Date  . EYE SURGERY     chalazion on both eyelids  . KNEE SURGERY Bilateral 2001  . NASAL SINUS SURGERY    . TONSILLECTOMY    . TOTAL KNEE ARTHROPLASTY Right 05/22/2015  . TOTAL KNEE ARTHROPLASTY Right 05/22/2015   Procedure: TOTAL KNEE ARTHROPLASTY;  Surgeon: Frederik Pear,  MD;  Location: [facility];  Service: Orthopedics;  Laterality: Right;  . TOTAL KNEE ARTHROPLASTY Left 07/17/2015  . TOTAL KNEE ARTHROPLASTY Left 07/17/2015   Procedure: TOTAL KNEE ARTHROPLASTY;  Surgeon: Frederik Pear, MD;  Location: [facility];  Service: Orthopedics;  Laterality: Left;  . ULNAR NERVE TRANSPOSITION  2007   left    REVIEW OF SYSTEMS:  A comprehensive review of systems was negative except for: Constitutional: positive for fatigue   PHYSICAL EXAMINATION: General appearance: alert, cooperative, fatigued and no distress Head: Normocephalic, without obvious abnormality, atraumatic Neck: no adenopathy Lymph nodes: Cervical, supraclavicular, and axillary nodes normal. Resp: clear to auscultation  bilaterally Back: symmetric, no curvature. ROM normal. No CVA tenderness. Cardio: regular rate and rhythm, S1, S2 normal, no murmur, click, rub or gallop GI: soft, non-tender; bowel sounds normal; no masses,  no organomegaly Extremities: extremities normal, atraumatic, no cyanosis or edema  ECOG PERFORMANCE STATUS: 1 - Symptomatic but completely ambulatory  Blood pressure (!) 151/78, pulse 77, temperature 97.8 F (36.6 C), temperature source Temporal, resp. rate 18, height 6' (1.829 m), weight 182 lb 4.8 oz (82.7 kg), SpO2 97 %.  LABORATORY DATA: Lab Results  Component Value Date   WBC 8.8 01/05/2019   HGB 18.4 (H) 01/05/2019   HCT 54.1 (H) 01/05/2019   MCV 101.3 (H) 01/05/2019   PLT 350 01/05/2019      Chemistry      Component Value Date/Time   NA 140 09/03/2018 0839   NA 139 11/10/2013 1508   K 4.8 09/03/2018 0839   K 4.3 11/10/2013 1508   CL 104 09/03/2018 0839   CL 102 05/04/2012 1309   CO2 27 09/03/2018 0839   CO2 28 11/10/2013 1508   BUN 20 09/03/2018 0839   BUN 15.4 11/10/2013 1508   CREATININE 1.02 09/03/2018 0839   CREATININE 1.1 11/10/2013 1508      Component Value Date/Time   CALCIUM 8.9 09/03/2018 0839   CALCIUM 9.6 11/10/2013 1508   ALKPHOS 73 09/03/2018 0839   ALKPHOS 78 11/10/2013 1508   AST 18 09/03/2018 0839   AST 19 11/10/2013 1508   ALT 14 09/03/2018 0839   ALT 15 11/10/2013 1508   BILITOT 0.5 09/03/2018 0839   BILITOT 0.43 11/10/2013 1508       RADIOGRAPHIC STUDIES: No results found.  ASSESSMENT AND PLAN: This is a very pleasant 63 years old white male with essential thrombocythemia currently on treatment with hydroxyurea 500 mg by mouth daily except Friday where the patient takes 1000 mg. The patient has been tolerating this treatment well with no concerning adverse effects. 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 His hematocrit today is 54.1%.  I  recommended for him to proceed with phlebotomy today as planned. I will see him back for follow-up visit in around 2 weeks for reevaluation and repeat phlebotomy if needed. The patient was advised to call immediately if he has any concerning symptoms in the interval. The patient had a lot of questions and I spent a good amount of money with him today answering his questions and addressing his issues. The patient voices understanding of current disease status and treatment options and is in agreement with the current care plan.  All questions were answered. The patient knows to call the clinic with any problems, questions or concerns. We can certainly see the patient much sooner if necessary.   Disclaimer: This note was dictated with voice recognition software. Similar  sounding words can inadvertently be transcribed and may be missed upon review.

## 2019-01-05 NOTE — Patient Instructions (Signed)

## 2019-01-05 NOTE — Progress Notes (Signed)
Dennis Martin presents today for phlebotomy per MD orders. 16 G phlebotomy kit in R [facility] Phlebotomy procedure started at 0933 and ended at 0937 527 cc removed. Patient tolerated procedure well. IV needle removed intact.VSS at d/c

## 2019-01-06 ENCOUNTER — Telehealth: Payer: Self-pay | Admitting: Internal Medicine

## 2019-01-06 NOTE — Telephone Encounter (Signed)
Scheduled appt per 10/13 los - pt is aware of appt date and time

## 2019-01-22 ENCOUNTER — Inpatient Hospital Stay (HOSPITAL_BASED_OUTPATIENT_CLINIC_OR_DEPARTMENT_OTHER): Payer: 59 | Admitting: Physician Assistant

## 2019-01-22 ENCOUNTER — Inpatient Hospital Stay: Payer: 59

## 2019-01-22 ENCOUNTER — Encounter: Payer: Self-pay | Admitting: Physician Assistant

## 2019-01-22 ENCOUNTER — Other Ambulatory Visit: Payer: Self-pay

## 2019-01-22 VITALS — BP 131/71 | HR 84 | Temp 98.0°F | Resp 18 | Ht 72.0 in | Wt 183.8 lb

## 2019-01-22 DIAGNOSIS — I10 Essential (primary) hypertension: Secondary | ICD-10-CM | POA: Diagnosis not present

## 2019-01-22 DIAGNOSIS — D751 Secondary polycythemia: Secondary | ICD-10-CM

## 2019-01-22 DIAGNOSIS — D473 Essential (hemorrhagic) thrombocythemia: Secondary | ICD-10-CM | POA: Diagnosis not present

## 2019-01-22 DIAGNOSIS — R161 Splenomegaly, not elsewhere classified: Secondary | ICD-10-CM

## 2019-01-22 LAB — CMP (CANCER CENTER ONLY)
ALT: 16 U/L (ref 0–44)
AST: 17 U/L (ref 15–41)
Albumin: 3.9 g/dL (ref 3.5–5.0)
Alkaline Phosphatase: 77 U/L (ref 38–126)
Anion gap: 10 (ref 5–15)
BUN: 14 mg/dL (ref 8–23)
CO2: 25 mmol/L (ref 22–32)
Calcium: 9 mg/dL (ref 8.9–10.3)
Chloride: 104 mmol/L (ref 98–111)
Creatinine: 0.98 mg/dL (ref 0.61–1.24)
GFR, Est AFR Am: >60 mL/min (ref >60)
GFR, Estimated: >60 mL/min (ref >60)
Glucose, Bld: 84 mg/dL (ref 70–99)
Potassium: 4.3 mmol/L (ref 3.5–5.1)
Sodium: 139 mmol/L (ref 135–145)
Total Bilirubin: 0.9 mg/dL (ref 0.3–1.2)
Total Protein: 6 g/dL — ABNORMAL LOW (ref 6.5–8.1)

## 2019-01-22 LAB — CBC WITH DIFFERENTIAL (CANCER CENTER ONLY)
Abs Immature Granulocytes: 0.05 10*3/uL (ref 0.00–0.07)
Basophils Absolute: 0.1 10*3/uL (ref 0.0–0.1)
Basophils Relative: 1 %
Eosinophils Absolute: 0.1 10*3/uL (ref 0.0–0.5)
Eosinophils Relative: 2 %
HCT: 52.3 % — ABNORMAL HIGH (ref 39.0–52.0)
Hemoglobin: 17.7 g/dL — ABNORMAL HIGH (ref 13.0–17.0)
Immature Granulocytes: 1 %
Lymphocytes Relative: 12 %
Lymphs Abs: 0.7 10*3/uL (ref 0.7–4.0)
MCH: 34.4 pg — ABNORMAL HIGH (ref 26.0–34.0)
MCHC: 33.8 g/dL (ref 30.0–36.0)
MCV: 101.6 fL — ABNORMAL HIGH (ref 80.0–100.0)
Monocytes Absolute: 0.7 10*3/uL (ref 0.1–1.0)
Monocytes Relative: 10 %
Neutro Abs: 4.7 10*3/uL (ref 1.7–7.7)
Neutrophils Relative %: 74 %
Platelet Count: 377 10*3/uL (ref 150–400)
RBC: 5.15 MIL/uL (ref 4.22–5.81)
RDW: 12.2 % (ref 11.5–15.5)
WBC Count: 6.3 10*3/uL (ref 4.0–10.5)
nRBC: 0 % (ref 0.0–0.2)

## 2019-01-22 LAB — LACTATE DEHYDROGENASE: LDH: 168 U/L (ref 98–192)

## 2019-01-22 NOTE — Patient Instructions (Signed)

## 2019-01-22 NOTE — Progress Notes (Signed)
[facility] OFFICE PROGRESS NOTE  [address], [place] [address]  DIAGNOSIS:  1) Essential thrombocythemia diagnosed in 2006  2) androgen-induced polycythemia.  PRIOR THERAPY: None  CURRENT THERAPY: 1) Hydroxyurea 500 mg by mouth daily except Friday he is on 1000 mg 2) phlebotomy on as-needed basis.  INTERVAL HISTORY: Dennis Martin 63 y.o. male returns to the clinic for a follow up visit. The patient is fair today without any concerning complaints except for fatigue with associated shortness of breath and occasional headaches. He denies any dizziness. He denies any epistaxis, gingival bleeding, melena, or hematochezia. Denies any abdominal pain, nausea, vomiting, diarrhea, or constipation. Denies any chest pain, cough, or hemoptysis. Denies any rashes or itching The patient is here today for evaluation and repeat lab work.  MEDICAL HISTORY: Past Medical History:  Diagnosis Date  . Anxiety    hx of - 10 years ago  . Arthritis    both arthritis, wrist, hands   . BPH (benign prostatic hyperplasia)   . DVT of leg (deep venous thrombosis) ([facility]) 2001   post knee arthroscopy,also complicated by infection, PICC line used for long term antibiotics, was on coumadin x's 6 months  . Essential thrombocytosis ([facility])   . Headache    relative to testosterone level, it had been a problem, improved with clomid  . History of stress test 2008   done in [place] ,nuclear stress test- told that it was normal  . Hypertension   . Peripheral vascular disease ([facility])   . Sleep apnea    did surgery for the problem  . Spleen enlarged   . Thrombocythemia ([facility])    monitored by Dr. Earlie Server    ALLERGIES:  is allergic to keflex [cephalexin].  MEDICATIONS:  Current Outpatient Medications  Medication Sig Dispense Refill  . aspirin EC 81 MG tablet Take 81 mg by mouth 2 (two) times daily.    . calcium carbonate (TUMS EX) 750 MG chewable tablet Chew 1  tablet by mouth daily as needed for heartburn.    . folic acid (FOLVITE) A999333 MCG tablet Take 400 mcg by mouth daily.    . hydroxyurea (HYDREA) 500 MG capsule TAKE 1 CAPSULE DAILY EXCEPT TAKE 2 CAPSULES ON FRIDAYS 105 capsule 4  . ketoconazole (NIZORAL) 2 % cream Apply 1 application topically daily.    Marland Kitchen lisinopril (ZESTRIL) 10 MG tablet TAKE 1 TABLET DAILY 90 tablet 1  . NYSTATIN powder APPLY TOPICALLY FOUR TIMES DAILY 60 g 9  . OVER THE COUNTER MEDICATION Vitafusion men's complete vitamin gummies; pt chews two gummies daily.    . tamsulosin (FLOMAX) 0.4 MG CAPS capsule 0.4 mg. 2 tablets a day at bedtime    . Testosterone (AXIRON) 30 MG/ACT SOLN 60 mg by Pump Prime route daily.     No current facility-administered medications for this visit.     SURGICAL HISTORY:  Past Surgical History:  Procedure Laterality Date  . EYE SURGERY     chalazion on both eyelids  . KNEE SURGERY Bilateral 2001  . NASAL SINUS SURGERY    . TONSILLECTOMY    . TOTAL KNEE ARTHROPLASTY Right 05/22/2015  . TOTAL KNEE ARTHROPLASTY Right 05/22/2015   Procedure: TOTAL KNEE ARTHROPLASTY;  Surgeon: Frederik Pear, MD;  Location: [facility];  Service: Orthopedics;  Laterality: Right;  . TOTAL KNEE ARTHROPLASTY Left 07/17/2015  . TOTAL KNEE ARTHROPLASTY Left 07/17/2015   Procedure: TOTAL KNEE ARTHROPLASTY;  Surgeon: Frederik Pear, MD;  Location: [facility];  Service: Orthopedics;  Laterality: Left;  . ULNAR NERVE TRANSPOSITION  2007   left    REVIEW OF SYSTEMS:   Review of Systems  Constitutional: Positive for fatigue. Negative for appetite change, chills, fever and unexpected weight change.  HENT: Negative for mouth sores, nosebleeds, sore throat and trouble swallowing.   Eyes: Negative for eye problems and icterus.  Respiratory: Positive for shortness of breath. Negative for cough, hemoptysis, and wheezing.   Cardiovascular: Negative for chest pain and leg swelling.  Gastrointestinal: Negative for abdominal pain, constipation,  diarrhea, nausea and vomiting.  Genitourinary: Negative for bladder incontinence, difficulty urinating, dysuria, frequency and hematuria.   Musculoskeletal: Negative for back pain, gait problem, neck pain and neck stiffness.  Skin: Negative for itching and rash.  Neurological: Positive for occasional headache. Negative for dizziness, extremity weakness, gait problem, light-headedness and seizures.  Hematological: Negative for adenopathy. Does not bruise/bleed easily.  Psychiatric/Behavioral: Negative for confusion, depression and sleep disturbance. The patient is not nervous/anxious.     PHYSICAL EXAMINATION:  Blood pressure 131/71, pulse 84, temperature 98 F (36.7 C), temperature source Temporal, resp. rate 18, height 6' (1.829 m), weight 183 lb 12.8 oz (83.4 kg), SpO2 98 %.  ECOG PERFORMANCE STATUS: 1 - Symptomatic but completely ambulatory  Physical Exam  Constitutional: Oriented to person, place, and time and well-developed, well-nourished, and in no distress.  HENT:  Head: Normocephalic and atraumatic.  Mouth/Throat: Oropharynx is clear and moist. No oropharyngeal exudate.  Eyes: Conjunctivae are normal. Right eye exhibits no discharge. Left eye exhibits no discharge. No scleral icterus.  Neck: Normal range of motion. Neck supple.  Cardiovascular: Normal rate, regular rhythm, normal heart sounds and intact distal pulses.   Pulmonary/Chest: Effort normal and breath sounds normal. No respiratory distress. No wheezes. No rales.  Abdominal: Soft. Bowel sounds are normal. Exhibits no distension and no mass. There is no tenderness.  Musculoskeletal: Normal range of motion. Exhibits no edema.  Lymphadenopathy:    No cervical adenopathy.  Neurological: Alert and oriented to person, place, and time. Exhibits normal muscle tone. Gait normal. Coordination normal.  Skin: Skin is warm and dry. No rash noted. Not diaphoretic. No erythema. No pallor.  Psychiatric: Mood, memory and judgment  normal.  Vitals reviewed.  LABORATORY DATA: Lab Results  Component Value Date   WBC 6.3 01/22/2019   HGB 17.7 (H) 01/22/2019   HCT 52.3 (H) 01/22/2019   MCV 101.6 (H) 01/22/2019   PLT 377 01/22/2019      Chemistry      Component Value Date/Time   NA 139 01/22/2019 0824   NA 139 11/10/2013 1508   K 4.3 01/22/2019 0824   K 4.3 11/10/2013 1508   CL 104 01/22/2019 0824   CL 102 05/04/2012 1309   CO2 25 01/22/2019 0824   CO2 28 11/10/2013 1508   BUN 14 01/22/2019 0824   BUN 15.4 11/10/2013 1508   CREATININE 0.98 01/22/2019 0824   CREATININE 1.1 11/10/2013 1508      Component Value Date/Time   CALCIUM 9.0 01/22/2019 0824   CALCIUM 9.6 11/10/2013 1508   ALKPHOS 77 01/22/2019 0824   ALKPHOS 78 11/10/2013 1508   AST 17 01/22/2019 0824   AST 19 11/10/2013 1508   ALT 16 01/22/2019 0824   ALT 15 11/10/2013 1508   BILITOT 0.9 01/22/2019 0824   BILITOT 0.43 11/10/2013 1508       RADIOGRAPHIC STUDIES:  No results found.   ASSESSMENT/PLAN:  This is a very pleasant 63 year old  Caucasian male diagnosed with essential thrombocythemia in 2006.  He is currently undergoing treatment with 500 mg p.o. daily of hydroxyurea except on Fridays where he takes 1000 mg.   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  The patient was seen with Dr. Earlie Server today.  Labs were reviewed.  The patient's hemoglobin today is 17.7 today. His hematocrit is 52.3% His platelets are within normal limits of 377. Dr. Julien Nordmann recommends that the patient undergo a therapeutic phlebotomy today.   We will see the patient back for a follow up visit at the beginning of January 2021 for evaluation and repeat blood work.   The patient was advised to call immediately if he has any concerning symptoms in the interval. The patient voices understanding of current disease status and treatment options and is in agreement with the  current care plan. All questions were answered. The patient knows to call the clinic with any problems, questions or concerns. We can certainly see the patient much sooner if necessary   Orders Placed This Encounter  Procedures  . Phlebotomy therapeutic    Standing Status:   Future    Number of Occurrences:   1    Standing Expiration Date:   01/22/2020  . CBC with Differential ([facility] Only)    Standing Status:   Future    Standing Expiration Date:   01/22/2020  . CMP ([facility] only)    Standing Status:   Future    Standing Expiration Date:   01/22/2020  . Lactate dehydrogenase (LDH)    Standing Status:   Future    Standing Expiration Date:   01/22/2020     Tobe Sos Brody Kump, PA-C 01/22/19  ADDENDUM: Hematology/Oncology Attending: I had a face-to-face encounter with the patient today.  I recommended his care plan.  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  The patient is feeling fine today with no concerning issues except for intermittent headache.  He is here today for evaluation and repeat CBC.  It showed persistent elevation of his hemoglobin and hematocrit. We will arrange for the patient to proceed with phlebotomy today as planned. We will see him back for follow-up visit in 2 months in early January 2021 for reevaluation and consideration of another phlebotomy.  He was interested in delaying the next phlebotomy until after the new year because of insurance coverage issues. He was advised to call immediately if he has any other concerning symptoms in the interval.  Disclaimer: This note was dictated with voice recognition software. Similar sounding words can inadvertently be transcribed and may be missed upon review. Eilleen Kempf, MD 01/22/19

## 2019-01-25 ENCOUNTER — Telehealth: Payer: Self-pay | Admitting: Physician Assistant

## 2019-01-25 NOTE — Telephone Encounter (Signed)
Scheduled per los. Called and spoke with patient. Confirmed appts  

## 2019-03-29 ENCOUNTER — Encounter: Payer: Self-pay | Admitting: Internal Medicine

## 2019-03-29 ENCOUNTER — Other Ambulatory Visit: Payer: Self-pay

## 2019-03-29 ENCOUNTER — Other Ambulatory Visit: Payer: Self-pay | Admitting: Internal Medicine

## 2019-03-29 ENCOUNTER — Inpatient Hospital Stay: Payer: Managed Care, Other (non HMO)

## 2019-03-29 ENCOUNTER — Inpatient Hospital Stay: Payer: Managed Care, Other (non HMO) | Attending: Internal Medicine | Admitting: Internal Medicine

## 2019-03-29 VITALS — BP 156/77 | HR 93 | Temp 97.8°F | Resp 19 | Ht 73.0 in | Wt 189.5 lb

## 2019-03-29 DIAGNOSIS — D751 Secondary polycythemia: Secondary | ICD-10-CM | POA: Diagnosis not present

## 2019-03-29 DIAGNOSIS — D473 Essential (hemorrhagic) thrombocythemia: Secondary | ICD-10-CM | POA: Insufficient documentation

## 2019-03-29 DIAGNOSIS — I10 Essential (primary) hypertension: Secondary | ICD-10-CM

## 2019-03-29 DIAGNOSIS — R161 Splenomegaly, not elsewhere classified: Secondary | ICD-10-CM | POA: Diagnosis not present

## 2019-03-29 LAB — CBC WITH DIFFERENTIAL (CANCER CENTER ONLY)
Abs Immature Granulocytes: 0.04 10*3/uL (ref 0.00–0.07)
Basophils Absolute: 0.1 10*3/uL (ref 0.0–0.1)
Basophils Relative: 1 %
Eosinophils Absolute: 0.1 10*3/uL (ref 0.0–0.5)
Eosinophils Relative: 2 %
HCT: 53.2 % — ABNORMAL HIGH (ref 39.0–52.0)
Hemoglobin: 16.5 g/dL (ref 13.0–17.0)
Immature Granulocytes: 1 %
Lymphocytes Relative: 9 %
Lymphs Abs: 0.6 10*3/uL — ABNORMAL LOW (ref 0.7–4.0)
MCH: 30.3 pg (ref 26.0–34.0)
MCHC: 31 g/dL (ref 30.0–36.0)
MCV: 97.6 fL (ref 80.0–100.0)
Monocytes Absolute: 0.6 10*3/uL (ref 0.1–1.0)
Monocytes Relative: 9 %
Neutro Abs: 5.4 10*3/uL (ref 1.7–7.7)
Neutrophils Relative %: 78 %
Platelet Count: 332 10*3/uL (ref 150–400)
RBC: 5.45 MIL/uL (ref 4.22–5.81)
RDW: 13.9 % (ref 11.5–15.5)
WBC Count: 6.9 10*3/uL (ref 4.0–10.5)
nRBC: 0 % (ref 0.0–0.2)

## 2019-03-29 LAB — CMP (CANCER CENTER ONLY)
ALT: 13 U/L (ref 0–44)
AST: 18 U/L (ref 15–41)
Albumin: 4 g/dL (ref 3.5–5.0)
Alkaline Phosphatase: 76 U/L (ref 38–126)
Anion gap: 11 (ref 5–15)
BUN: 17 mg/dL (ref 8–23)
CO2: 27 mmol/L (ref 22–32)
Calcium: 8.7 mg/dL — ABNORMAL LOW (ref 8.9–10.3)
Chloride: 103 mmol/L (ref 98–111)
Creatinine: 1.13 mg/dL (ref 0.61–1.24)
GFR, Est AFR Am: >60 mL/min (ref >60)
GFR, Estimated: >60 mL/min (ref >60)
Glucose, Bld: 89 mg/dL (ref 70–99)
Potassium: 4.5 mmol/L (ref 3.5–5.1)
Sodium: 141 mmol/L (ref 135–145)
Total Bilirubin: 0.6 mg/dL (ref 0.3–1.2)
Total Protein: 6.2 g/dL — ABNORMAL LOW (ref 6.5–8.1)

## 2019-03-29 LAB — LACTATE DEHYDROGENASE: LDH: 221 U/L — ABNORMAL HIGH (ref 98–192)

## 2019-03-29 MED ORDER — HYDROXYUREA 500 MG PO CAPS: ORAL_CAPSULE | ORAL | 4 refills | Status: DC

## 2019-03-29 NOTE — Patient Instructions (Signed)
Therapeutic Phlebotomy, Care After This sheet gives you information about how to care for yourself after your procedure. Your health care provider may also give you more specific instructions. If you have problems or questions, contact your health care provider. What can I expect after the procedure? After the procedure, it is common to have:  Light-headedness or dizziness. You may feel faint.  Nausea.  Tiredness (fatigue). Follow these instructions at home: Eating and drinking  Be sure to eat well-balanced meals for the next 24 hours.  Drink enough fluid to keep your urine pale yellow.  Avoid drinking alcohol on the day that you had the procedure. Activity   Return to your normal activities as told by your health care provider. Most people can go back to their normal activities right away.  Avoid activities that take a lot of effort for about 5 hours after the procedure. Athletes should avoid strenuous exercise for at least 12 hours.  Avoid heavy lifting or pulling for about 5 hours after the procedure. Do not lift anything that is heavier than 10 lb (4.5 kg).  Change positions slowly for the remainder of the day. This will help to prevent light-headedness or fainting.  If you feel light-headed, lie down until the feeling goes away. Needle insertion site care   Keep your bandage (dressing) dry. You can remove the bandage after about 5 hours or as told by your health care provider.  If you have bleeding from the needle insertion site, raise (elevate) your arm and press firmly on the site until the bleeding stops.  If you have bruising at the site, apply ice to the area: ? Remove the dressing. ? Put ice in a plastic bag. ? Place a towel between your skin and the bag. ? Leave the ice on for 20 minutes, 2-3 times a day for the first 24 hours.  If the swelling does not go away after 24 hours, apply a warm, moist cloth (warm compress) to the area for 20 minutes, 2-3 times a  day. General instructions  Do not use any products that contain nicotine or tobacco, such as cigarettes and e-cigarettes, for at least 30 minutes after the procedure.  Keep all follow-up visits as told by your health care provider. This is important. You may need to continue having regular therapeutic phlebotomy treatments as directed. Contact a health care provider if you:  Have redness, swelling, or pain at the needle insertion site.  Have fluid or blood coming from the needle insertion site.  Have pus or a bad smell coming from the needle insertion site.  Notice that the needle insertion site feels warm to the touch.  Feel light-headed, dizzy, or nauseous, and the feeling does not go away.  Have new bruising at the needle insertion site.  Feel weaker than normal.  Have a fever or chills. Get help right away if:  You faint.  You have chest pain.  You have trouble breathing.  You have severe nausea or vomiting. Summary  After the procedure, it is common to have some light-headedness, dizziness, nausea, or tiredness (fatigue).  Be sure to eat well-balanced meals for the next 24 hours. Drink enough fluid to keep your urine pale yellow.  Return to your normal activities as told by your health care provider.  Keep all follow-up visits as told by your health care provider. You may need to continue having regular therapeutic phlebotomy treatments as directed. This information is not intended to replace advice given to you   by your health care provider. Make sure you discuss any questions you have with your health care provider. Document Revised: 03/28/2017 Document Reviewed: 03/27/2017 Elsevier Patient Education  2020 Else[employer] Inc.  Coronavirus (COVID-19) Are you at risk?  Are you at risk for the Coronavirus (COVID-19)?  To be considered HIGH RISK for Coronavirus (COVID-19), you have to meet the following criteria:  . Traveled to [place], [place], [place], [place] or [place];  or in the [place] to [place], [place], [place], or [place]; and have fever, cough, and shortness of breath within the last 2 weeks of travel OR . Been in close contact with a person diagnosed with COVID-19 within the last 2 weeks and have fever, cough, and shortness of breath . IF YOU DO NOT MEET THESE CRITERIA, YOU ARE CONSIDERED LOW RISK FOR COVID-19.  What to do if you are HIGH RISK for COVID-19?  . If you are having a medical emergency, call 911. . Seek medical care right away. Before you go to a doctor's office, urgent care or emergency department, call ahead and tell them about your recent travel, contact with someone diagnosed with COVID-19, and your symptoms. You should receive instructions from your physician's office regarding next steps of care.  . When you arrive at healthcare provider, tell the healthcare staff immediately you have returned from visiting [place], [place], [place], [place] or [place]; or traveled in the [place] to [place], [place], [place], or [place]; in the last two weeks or you have been in close contact with a person diagnosed with COVID-19 in the last 2 weeks.   . Tell the health care staff about your symptoms: fever, cough and shortness of breath. . After you have been seen by a medical provider, you will be either: o Tested for (COVID-19) and discharged home on quarantine except to seek medical care if symptoms worsen, and asked to  - Stay home and avoid contact with others until you get your results (4-5 days)  - Avoid travel on public transportation if possible (such as bus, train, or airplane) or o Sent to the Emergency Department by EMS for evaluation, COVID-19 testing, and possible admission depending on your condition and test results.  What to do if you are LOW RISK for COVID-19?  Reduce your risk of any infection by using the same precautions used for avoiding the common cold or flu:  . Wash your hands often with soap and  warm water for at least 20 seconds.  If soap and water are not readily available, use an alcohol-based hand sanitizer with at least 60% alcohol.  . If coughing or sneezing, cover your mouth and nose by coughing or sneezing into the elbow areas of your shirt or coat, into a tissue or into your sleeve (not your hands). . Avoid shaking hands with others and consider head nods or verbal greetings only. . Avoid touching your eyes, nose, or mouth with unwashed hands.  . Avoid close contact with people who are sick. . Avoid places or events with large numbers of people in one location, like concerts or sporting events. . Carefully consider travel plans you have or are making. . If you are planning any travel outside or inside the [place], visit the CDC's Travelers' Health webpage for the latest health notices. . If you have some symptoms but not all symptoms, continue to monitor at home and seek medical attention if your symptoms worsen. . If you are having a medical   emergency, call 911.   ADDITIONAL HEALTHCARE OPTIONS FOR PATIENTS  [facility] / e-Visit: [email or web address]         [facility]: [phone]  [facility]: [phone]                   [facility]: [phone]  

## 2019-03-29 NOTE — Progress Notes (Signed)
[name] presents today for phlebotomy per MD orders. RAC accessed with 16G phlebotomy kit. Phlebotomy procedure started at 10:16 and ended at 10:23. 529 grams removed. IV needle removed intact. Patient tolerated procedure well. Snack and drink provided. Patient observed for 30 minutes after procedure without any incident. Vital signs stable and patient ambulated out of clinic without issue.

## 2019-03-29 NOTE — Progress Notes (Signed)
[facility] Telephone:([phone]   [phone]) [phone]  OFFICE PROGRESS NOTE  [place], [place] [address]  DIAGNOSIS:  1) Essential thrombocythemia diagnosed in 2006  2) androgen-induced polycythemia.  PRIOR THERAPY: None   CURRENT THERAPY:  1) Hydroxyurea 500 mg by mouth daily except Friday he is on 1000 mg 2) phlebotomy on as-needed basis.  INTERVAL HISTORY: Dennis Martin 64 y.o. male returns to the clinic today for follow-up visit.  The patient is feeling fine today with no concerning complaints except for fatigue and mild pain and the left upper quadrant at the area of his spleen.  He denied having any chest pain, shortness of breath, cough or hemoptysis.  He denied having any fever or chills.  He has no nausea, vomiting, diarrhea or constipation.  He has no headache or visual changes.  He is here today for evaluation and repeat CBC, comprehensive metabolic panel and LDH.   MEDICAL HISTORY: Past Medical History:  Diagnosis Date  . Anxiety    hx of - 10 years ago  . Arthritis    both arthritis, wrist, hands   . BPH (benign prostatic hyperplasia)   . DVT of leg (deep venous thrombosis) ([facility]) 2001   post knee arthroscopy,also complicated by infection, PICC line used for long term antibiotics, was on coumadin x's 6 months  . Essential thrombocytosis ([facility])   . Headache    relative to testosterone level, it had been a problem, improved with clomid  . History of stress test 2008   done in [place] ,nuclear stress test- told that it was normal  . Hypertension   . Peripheral vascular disease ([facility])   . Sleep apnea    did surgery for the problem  . Spleen enlarged   . Thrombocythemia ([facility])    monitored by Dr. Earlie Server    ALLERGIES:  is allergic to keflex [cephalexin].  MEDICATIONS:  Current Outpatient Medications  Medication Sig Dispense Refill  . aspirin EC 81 MG tablet Take 81 mg by mouth 2 (two) times daily.     . calcium carbonate (TUMS EX) 750 MG chewable tablet Chew 1 tablet by mouth daily as needed for heartburn.    . folic acid (FOLVITE) A999333 MCG tablet Take 400 mcg by mouth daily.    . hydroxyurea (HYDREA) 500 MG capsule TAKE 1 CAPSULE DAILY EXCEPT TAKE 2 CAPSULES ON FRIDAYS 105 capsule 4  . ketoconazole (NIZORAL) 2 % cream Apply 1 application topically daily.    Marland Kitchen lisinopril (ZESTRIL) 10 MG tablet TAKE 1 TABLET DAILY 90 tablet 1  . NYSTATIN powder APPLY TOPICALLY FOUR TIMES DAILY 60 g 9  . OVER THE COUNTER MEDICATION Vitafusion men's complete vitamin gummies; pt chews two gummies daily.    . tamsulosin (FLOMAX) 0.4 MG CAPS capsule 0.4 mg. 2 tablets a day at bedtime    . Testosterone (AXIRON) 30 MG/ACT SOLN 60 mg by Pump Prime route daily.    Marland Kitchen amoxicillin (AMOXIL) 500 MG capsule SMARTSIG:4 Capsule(s) By Mouth Once     No current facility-administered medications for this visit.    SURGICAL HISTORY:  Past Surgical History:  Procedure Laterality Date  . EYE SURGERY     chalazion on both eyelids  . KNEE SURGERY Bilateral 2001  . NASAL SINUS SURGERY    . TONSILLECTOMY    . TOTAL KNEE ARTHROPLASTY Right 05/22/2015  . TOTAL KNEE ARTHROPLASTY Right 05/22/2015   Procedure: TOTAL KNEE ARTHROPLASTY;  Surgeon: Frederik Pear, MD;  Location: [facility];  Service: Orthopedics;  Laterality: Right;  . TOTAL KNEE ARTHROPLASTY Left 07/17/2015  . TOTAL KNEE ARTHROPLASTY Left 07/17/2015   Procedure: TOTAL KNEE ARTHROPLASTY;  Surgeon: Frederik Pear, MD;  Location: [facility];  Service: Orthopedics;  Laterality: Left;  . ULNAR NERVE TRANSPOSITION  2007   left    REVIEW OF SYSTEMS:  Constitutional: positive for fatigue Eyes: negative Ears, nose, mouth, throat, and face: negative Respiratory: negative Cardiovascular: negative Gastrointestinal: positive for abdominal pain Genitourinary:negative Integument/breast: negative Hematologic/lymphatic: negative Musculoskeletal:negative Neurological: negative  Behavioral/Psych: negative Endocrine: negative Allergic/Immunologic: negative   PHYSICAL EXAMINATION: General appearance: alert, cooperative, fatigued and no distress Head: Normocephalic, without obvious abnormality, atraumatic Neck: no adenopathy Lymph nodes: Cervical, supraclavicular, and axillary nodes normal. Resp: clear to auscultation bilaterally Back: symmetric, no curvature. ROM normal. No CVA tenderness. Cardio: regular rate and rhythm, S1, S2 normal, no murmur, click, rub or gallop GI: soft, non-tender; bowel sounds normal; no masses,  no organomegaly Extremities: extremities normal, atraumatic, no cyanosis or edema Neurologic: Mental status: Alert, oriented, thought content appropriate  ECOG PERFORMANCE STATUS: 1 - Symptomatic but completely ambulatory  Blood pressure (!) 156/77, pulse 93, temperature 97.8 F (36.6 C), temperature source Temporal, resp. rate 19, height 6\' 1"  (1.854 m), weight 189 lb 8 oz (86 kg), SpO2 98 %.  LABORATORY DATA: Lab Results  Component Value Date   WBC 6.9 03/29/2019   HGB 16.5 03/29/2019   HCT 53.2 (H) 03/29/2019   MCV 97.6 03/29/2019   PLT 332 03/29/2019      Chemistry      Component Value Date/Time   NA 141 03/29/2019 0900   NA 139 11/10/2013 1508   K 4.5 03/29/2019 0900   K 4.3 11/10/2013 1508   CL 103 03/29/2019 0900   CL 102 05/04/2012 1309   CO2 27 03/29/2019 0900   CO2 28 11/10/2013 1508   BUN 17 03/29/2019 0900   BUN 15.4 11/10/2013 1508   CREATININE 1.13 03/29/2019 0900   CREATININE 1.1 11/10/2013 1508      Component Value Date/Time   CALCIUM 8.7 (L) 03/29/2019 0900   CALCIUM 9.6 11/10/2013 1508   ALKPHOS 76 03/29/2019 0900   ALKPHOS 78 11/10/2013 1508   AST 18 03/29/2019 0900   AST 19 11/10/2013 1508   ALT 13 03/29/2019 0900   ALT 15 11/10/2013 1508   BILITOT 0.6 03/29/2019 0900   BILITOT 0.43 11/10/2013 1508       RADIOGRAPHIC STUDIES: No results found.  ASSESSMENT AND PLAN: This is a very pleasant 64  years old white male with essential thrombocythemia currently on treatment with hydroxyurea 500 mg by mouth daily except Friday where the patient takes 1000 mg. He continues to tolerate his treatment with hydroxyurea fairly well. 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 His CBC today showed hematocrit of 53.2%. I recommended for the patient to proceed with phlebotomy today as planned. I also discussed with the patient the option of treatment with Jakafi (Ruxolitinib) 5 mg p.o. twice daily because of the worsening pain in the left upper quadrant and splenomegaly.  He will check with his insurance company for the coverage and deductible. He will continue on his current treatment with hydroxyurea for now. For hypertension, I recommended for the patient to monitor his blood pressure closely at home and to discuss with his primary care physician for changes of his medication if needed. I will see  the patient back for follow-up visit in around 4 weeks for reevaluation with repeat CBC, comprehensive metabolic panel and LDH as well as phlebotomy if needed. He was advised to call immediately if he has any concerning symptoms in the interval. The patient had a lot of questions and I spent a good amount of money with him today answering his questions and addressing his issues. The patient voices understanding of current disease status and treatment options and is in agreement with the current care plan.  All questions were answered. The patient knows to call the clinic with any problems, questions or concerns. We can certainly see the patient much sooner if necessary.   Disclaimer: This note was dictated with voice recognition software. Similar sounding words can inadvertently be transcribed and may be missed upon review.

## 2019-03-30 ENCOUNTER — Telehealth: Payer: Self-pay | Admitting: Internal Medicine

## 2019-03-30 NOTE — Telephone Encounter (Signed)
Returned patient's phone call regarding rescheduling 02/01 appointment time, per patient's request appointment time has been rescheduled.

## 2019-03-31 ENCOUNTER — Telehealth: Payer: Self-pay | Admitting: Internal Medicine

## 2019-03-31 NOTE — Telephone Encounter (Signed)
Scheduled per los. Called and spoke with patient. Confirmed 2/1 appt

## 2019-04-21 IMAGING — CT CT ANGIO CHEST
2 of 7 series · 18 of 46 positions shown · IV contrast (APPLIED)
Comparison: Chest x-ray earlier today.

CLINICAL DATA: Left leg/calf pain shortness of Breath

EXAM:
CT ANGIOGRAPHY CHEST WITH CONTRAST
TECHNIQUE: Multidetector CT imaging of the chest was performed using the
standard protocol during bolus administration of intravenous
contrast. Multiplanar CT image reconstructions and MIPs were
obtained to evaluate the vascular anatomy.
CONTRAST:  80 cc Isovue 370 IV

[Series 7: thins · axial · 0.68mm/px · z∈[+1278,+1506]mm · 15 of 367 slices shown]
[im 21/367  lung]
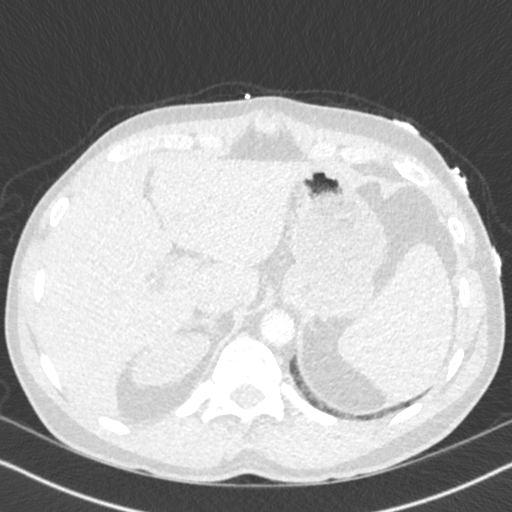
[im 41/367  soft-tissue]
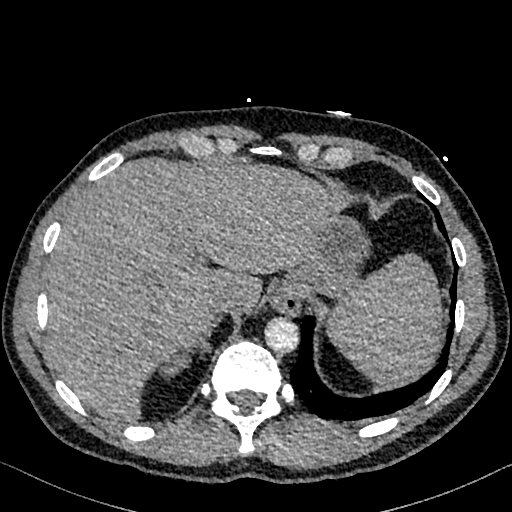
[im 62/367  lung]
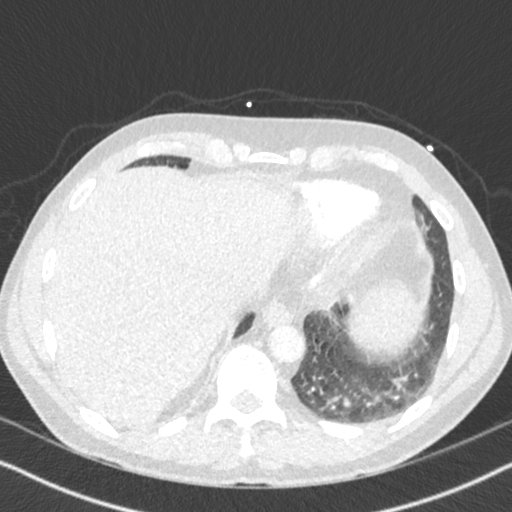
[im 82/367  soft-tissue]
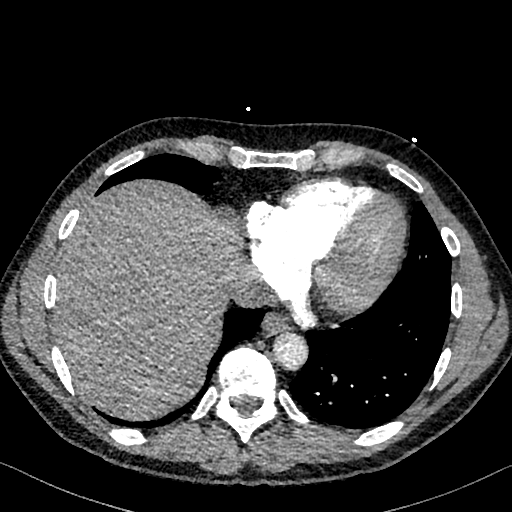
[im 123/367  lung]
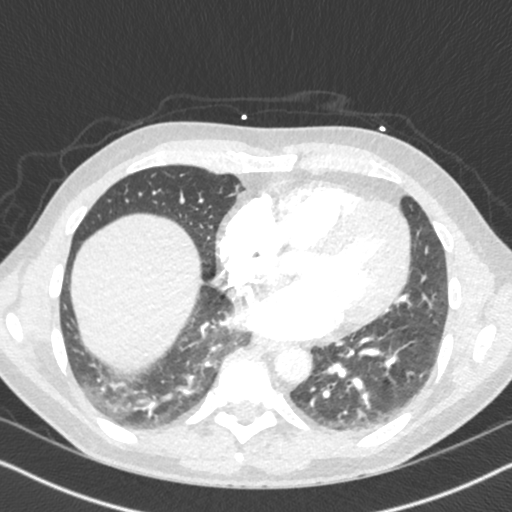
[im 143/367  soft-tissue]
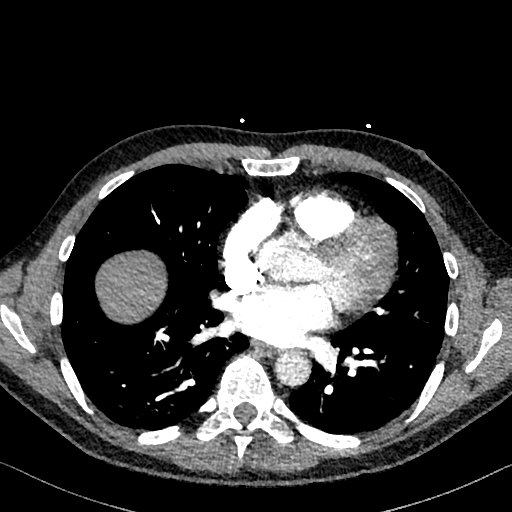
[im 163/367  lung]
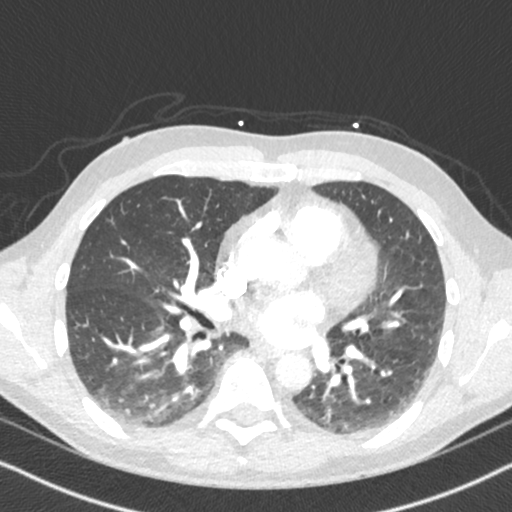
[im 184/367  soft-tissue]
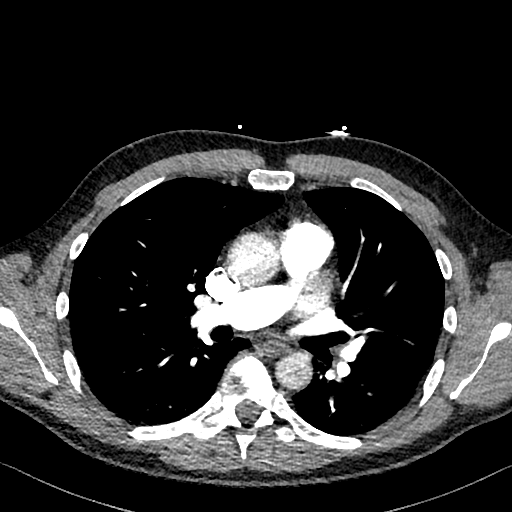
[im 204/367  lung]
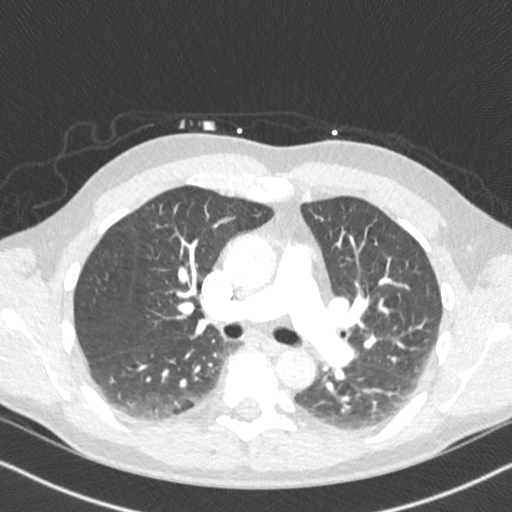
[im 224/367  soft-tissue]
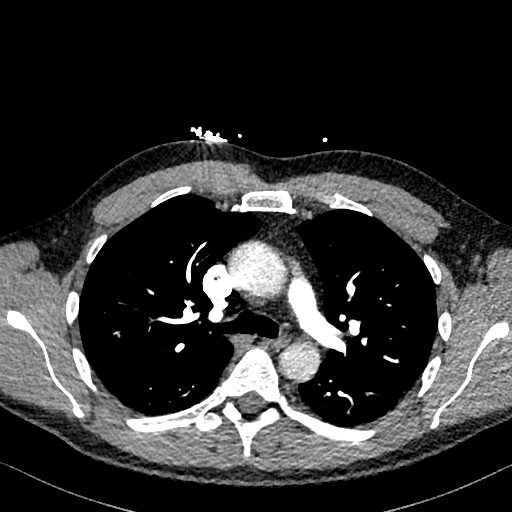
[im 245/367  lung]
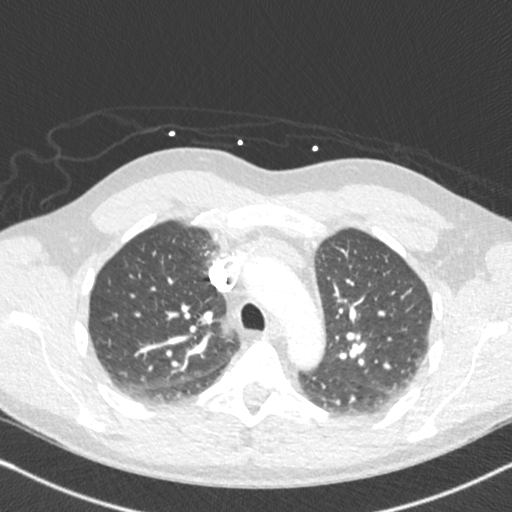
[im 285/367  soft-tissue]
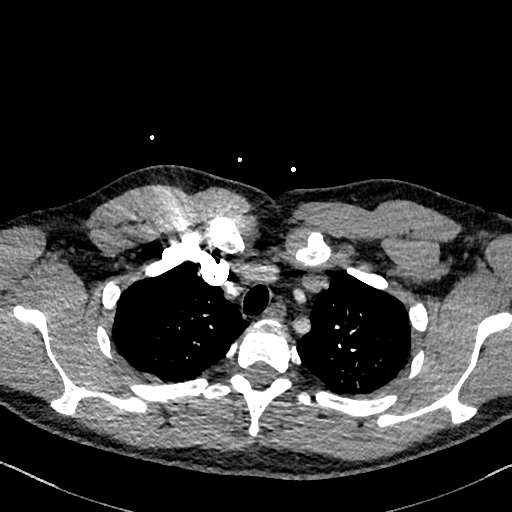
[im 306/367  lung]
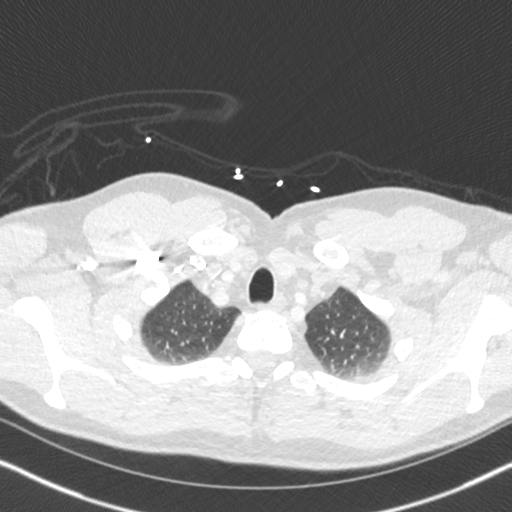
[im 326/367  soft-tissue]
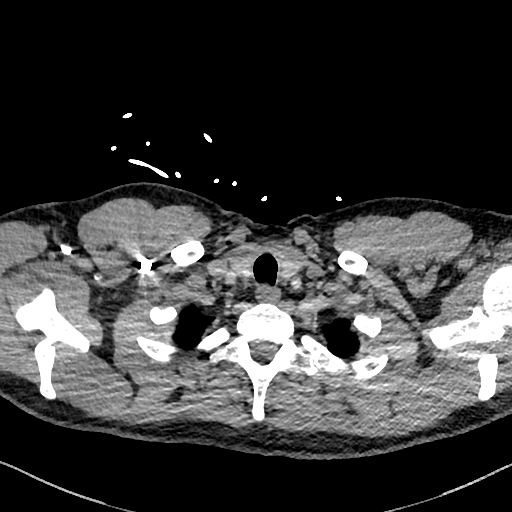
[im 346/367  lung]
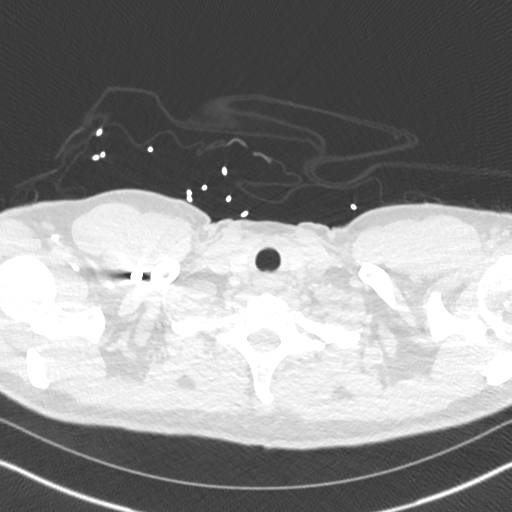

[Series 8: cor · coronal · 0.51mm/px · 3 of 124 slices shown]
[im 31/124  soft-tissue]
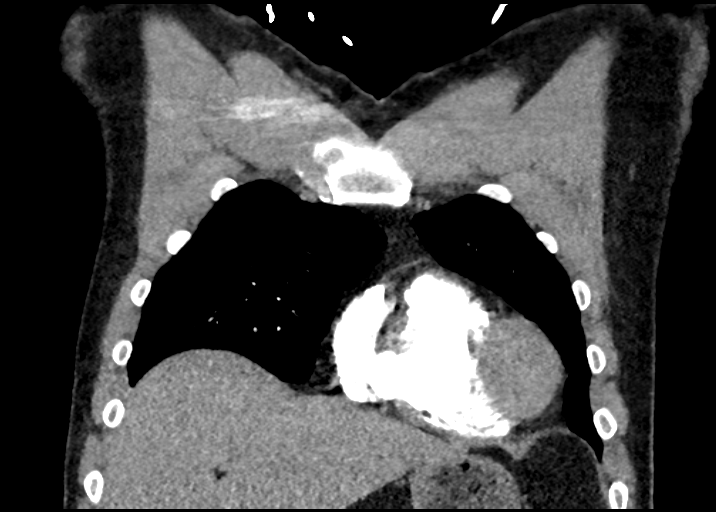
[im 62/124  soft-tissue]
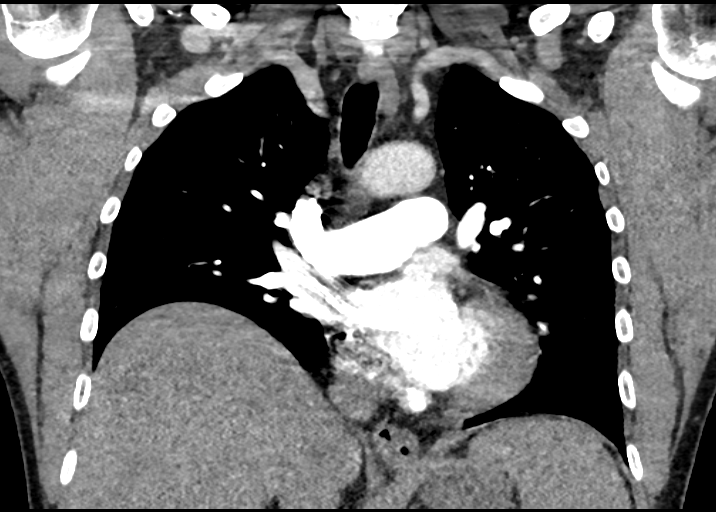
[im 93/124  soft-tissue]
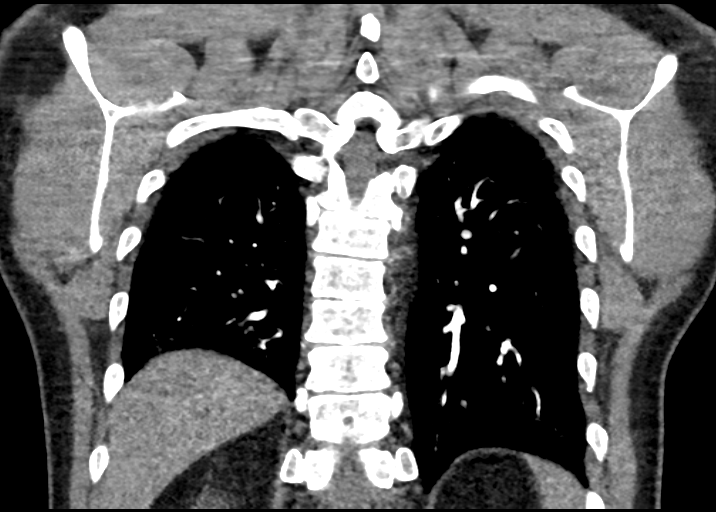

[18 of 46 positions shown; findings below may reference images not displayed]

FINDINGS: Cardiovascular: No filling defects in the pulmonary arteries to
suggest pulmonary emboli. Heart is normal size. Aorta is normal
caliber.

Mediastinum/Nodes: No mediastinal, hilar, or axillary adenopathy.

Lungs/Pleura: Ground-glass opacities in the lung bases and dependent
lower lobes most likely atelectasis. No effusions.

Upper Abdomen: Imaging into the upper abdomen shows no acute
findings.

Musculoskeletal: Chest wall soft tissues are unremarkable. No acute
bony abnormality.

Review of the MIP images confirms the above findings.
IMPRESSION: No evidence of pulmonary embolus.

Dependent and bibasilar ground-glass opacities, likely atelectasis.

## 2019-04-26 ENCOUNTER — Encounter: Payer: Self-pay | Admitting: Internal Medicine

## 2019-04-26 ENCOUNTER — Other Ambulatory Visit: Payer: Self-pay | Admitting: Internal Medicine

## 2019-04-26 ENCOUNTER — Other Ambulatory Visit: Payer: Self-pay

## 2019-04-26 ENCOUNTER — Inpatient Hospital Stay: Payer: Managed Care, Other (non HMO) | Admitting: Internal Medicine

## 2019-04-26 ENCOUNTER — Inpatient Hospital Stay: Payer: Managed Care, Other (non HMO)

## 2019-04-26 ENCOUNTER — Other Ambulatory Visit: Payer: Managed Care, Other (non HMO)

## 2019-04-26 ENCOUNTER — Ambulatory Visit: Payer: Managed Care, Other (non HMO) | Admitting: Internal Medicine

## 2019-04-26 ENCOUNTER — Inpatient Hospital Stay: Payer: Managed Care, Other (non HMO) | Attending: Internal Medicine

## 2019-04-26 VITALS — BP 133/78 | HR 77 | Temp 98.0°F | Resp 18 | Ht 73.0 in | Wt 187.0 lb

## 2019-04-26 DIAGNOSIS — D473 Essential (hemorrhagic) thrombocythemia: Secondary | ICD-10-CM | POA: Diagnosis not present

## 2019-04-26 DIAGNOSIS — D751 Secondary polycythemia: Secondary | ICD-10-CM | POA: Diagnosis not present

## 2019-04-26 DIAGNOSIS — I10 Essential (primary) hypertension: Secondary | ICD-10-CM | POA: Diagnosis not present

## 2019-04-26 DIAGNOSIS — R161 Splenomegaly, not elsewhere classified: Secondary | ICD-10-CM

## 2019-04-26 LAB — CBC WITH DIFFERENTIAL (CANCER CENTER ONLY)
Abs Immature Granulocytes: 0.07 10*3/uL (ref 0.00–0.07)
Basophils Absolute: 0.1 10*3/uL (ref 0.0–0.1)
Basophils Relative: 1 %
Eosinophils Absolute: 0.1 10*3/uL (ref 0.0–0.5)
Eosinophils Relative: 2 %
HCT: 51.9 % (ref 39.0–52.0)
Hemoglobin: 15.9 g/dL (ref 13.0–17.0)
Immature Granulocytes: 1 %
Lymphocytes Relative: 10 %
Lymphs Abs: 0.7 10*3/uL (ref 0.7–4.0)
MCH: 29.5 pg (ref 26.0–34.0)
MCHC: 30.6 g/dL (ref 30.0–36.0)
MCV: 96.3 fL (ref 80.0–100.0)
Monocytes Absolute: 0.7 10*3/uL (ref 0.1–1.0)
Monocytes Relative: 10 %
Neutro Abs: 5.7 10*3/uL (ref 1.7–7.7)
Neutrophils Relative %: 76 %
Platelet Count: 322 10*3/uL (ref 150–400)
RBC: 5.39 MIL/uL (ref 4.22–5.81)
RDW: 14.6 % (ref 11.5–15.5)
WBC Count: 7.4 10*3/uL (ref 4.0–10.5)
nRBC: 0 % (ref 0.0–0.2)

## 2019-04-26 LAB — CMP (CANCER CENTER ONLY)
ALT: 17 U/L (ref 0–44)
AST: 17 U/L (ref 15–41)
Albumin: 4.1 g/dL (ref 3.5–5.0)
Alkaline Phosphatase: 72 U/L (ref 38–126)
Anion gap: 9 (ref 5–15)
BUN: 16 mg/dL (ref 8–23)
CO2: 30 mmol/L (ref 22–32)
Calcium: 8.9 mg/dL (ref 8.9–10.3)
Chloride: 103 mmol/L (ref 98–111)
Creatinine: 1.11 mg/dL (ref 0.61–1.24)
GFR, Est AFR Am: >60 mL/min (ref >60)
GFR, Estimated: >60 mL/min (ref >60)
Glucose, Bld: 66 mg/dL — ABNORMAL LOW (ref 70–99)
Potassium: 4.7 mmol/L (ref 3.5–5.1)
Sodium: 142 mmol/L (ref 135–145)
Total Bilirubin: 0.7 mg/dL (ref 0.3–1.2)
Total Protein: 6.1 g/dL — ABNORMAL LOW (ref 6.5–8.1)

## 2019-04-26 LAB — LACTATE DEHYDROGENASE: LDH: 191 U/L (ref 98–192)

## 2019-04-26 NOTE — Progress Notes (Signed)
Dennis Martin presents today for phlebotomy per MD orders. Phlebotomy procedure started at 0941 and ended at 0947. 506 grams removed using 16 gauge phlebotomy needle x 1 stick in right AC. Patient tolerated procedure well. IV needle removed intact.

## 2019-04-26 NOTE — Progress Notes (Signed)
Dennis Martin   Fax:(336) [phone]  OFFICE PROGRESS NOTE  Dennis Martin, Dennis Martin  DIAGNOSIS:  1) Essential thrombocythemia diagnosed in 2006  2) androgen-induced polycythemia.  PRIOR THERAPY: None   CURRENT THERAPY:  1) Hydroxyurea 500 mg by mouth daily except Friday he is on 1000 mg 2) phlebotomy on as-needed basis.  INTERVAL HISTORY: Dennis Martin 64 y.o. male returns to the clinic today for follow-up visit.  The patient is feeling fine today with no concerning complaints except for intermittent headache and left upper quadrant pain.  He denied having any chest pain, shortness of breath, cough or hemoptysis.  He denied having any fever or chills.  He has no nausea, vomiting, diarrhea or constipation.  He denied having any weight loss or night sweats.  He is here today for evaluation with repeat blood work and phlebotomy if needed.   MEDICAL HISTORY: Past Medical History:  Diagnosis Date  . Anxiety    hx of - 10 years ago  . Arthritis    both arthritis, wrist, hands   . BPH (benign prostatic hyperplasia)   . DVT of leg (deep venous thrombosis) ([facility]) 2001   post knee arthroscopy,also complicated by infection, PICC line used for long term antibiotics, was on coumadin x's 6 months  . Essential thrombocytosis ([place])   . Headache    relative to testosterone level, it had been a problem, improved with clomid  . History of stress test 2008   done in [place] ,nuclear stress test- told that it was normal  . Hypertension   . Peripheral vascular disease ([facility])   . Sleep apnea    did surgery for the problem  . Spleen enlarged   . Thrombocythemia ([facility])    monitored by Dr. Earlie Server    ALLERGIES:  is allergic to keflex [cephalexin].  MEDICATIONS:  Current Outpatient Medications  Medication Sig Dispense Refill  . amoxicillin (AMOXIL) 500 MG capsule SMARTSIG:4 Capsule(s) By Mouth Once    .  aspirin EC 81 MG tablet Take 81 mg by mouth 2 (two) times daily.    . calcium carbonate (TUMS EX) 750 MG chewable tablet Chew 1 tablet by mouth daily as needed for heartburn.    . folic acid (FOLVITE) A999333 MCG tablet Take 400 mcg by mouth daily.    . hydroxyurea (HYDREA) 500 MG capsule TAKE 1 CAPSULE DAILY EXCEPT TAKE 2 CAPSULES ON FRIDAYS 105 capsule 4  . ketoconazole (NIZORAL) 2 % cream Apply 1 application topically daily.    Marland Kitchen lisinopril (ZESTRIL) 10 MG tablet TAKE 1 TABLET DAILY 90 tablet 1  . NYSTATIN powder APPLY TOPICALLY FOUR TIMES DAILY 60 g 9  . OVER THE COUNTER MEDICATION Vitafusion men's complete vitamin gummies; pt chews two gummies daily.    . tamsulosin (FLOMAX) 0.4 MG CAPS capsule 0.4 mg. 2 tablets a day at bedtime    . Testosterone (AXIRON) 30 MG/ACT SOLN 60 mg by Pump Prime route daily.     No current facility-administered medications for this visit.    SURGICAL HISTORY:  Past Surgical History:  Procedure Laterality Date  . EYE SURGERY     chalazion on both eyelids  . KNEE SURGERY Bilateral 2001  . NASAL SINUS SURGERY    . TONSILLECTOMY    . TOTAL KNEE ARTHROPLASTY Right 05/22/2015  . TOTAL KNEE ARTHROPLASTY Right 05/22/2015   Procedure: TOTAL KNEE ARTHROPLASTY;  Surgeon: Frederik Pear, MD;  Location:  [facility];  Service: Orthopedics;  Laterality: Right;  . TOTAL KNEE ARTHROPLASTY Left 07/17/2015  . TOTAL KNEE ARTHROPLASTY Left 07/17/2015   Procedure: TOTAL KNEE ARTHROPLASTY;  Surgeon: Frederik Pear, MD;  Location: [facility];  Service: Orthopedics;  Laterality: Left;  . ULNAR NERVE TRANSPOSITION  2007   left    REVIEW OF SYSTEMS:  A comprehensive review of systems was negative except for: Constitutional: positive for fatigue Neurological: positive for headaches   PHYSICAL EXAMINATION: General appearance: alert, cooperative, fatigued and no distress Head: Normocephalic, without obvious abnormality, atraumatic Neck: no adenopathy Lymph nodes: Cervical, supraclavicular, and  axillary nodes normal. Resp: clear to auscultation bilaterally Back: symmetric, no curvature. ROM normal. No CVA tenderness. Cardio: regular rate and rhythm, S1, S2 normal, no murmur, click, rub or gallop GI: soft, non-tender; bowel sounds normal; no masses,  no organomegaly Extremities: extremities normal, atraumatic, no cyanosis or edema  ECOG PERFORMANCE STATUS: 1 - Symptomatic but completely ambulatory  Blood pressure 133/78, pulse 77, temperature 98 F (36.7 C), temperature source Temporal, resp. rate 18, height 6\' 1"  (1.854 m), weight 187 lb (84.8 kg), SpO2 100 %.  LABORATORY DATA: Lab Results  Component Value Date   WBC 7.4 04/26/2019   HGB 15.9 04/26/2019   HCT 51.9 04/26/2019   MCV 96.3 04/26/2019   PLT 322 04/26/2019      Chemistry      Component Value Date/Time   NA 141 03/29/2019 0900   NA 139 11/10/2013 1508   K 4.5 03/29/2019 0900   K 4.3 11/10/2013 1508   CL 103 03/29/2019 0900   CL 102 05/04/2012 1309   CO2 27 03/29/2019 0900   CO2 28 11/10/2013 1508   BUN 17 03/29/2019 0900   BUN 15.4 11/10/2013 1508   CREATININE 1.13 03/29/2019 0900   CREATININE 1.1 11/10/2013 1508      Component Value Date/Time   CALCIUM 8.7 (L) 03/29/2019 0900   CALCIUM 9.6 11/10/2013 1508   ALKPHOS 76 03/29/2019 0900   ALKPHOS 78 11/10/2013 1508   AST 18 03/29/2019 0900   AST 19 11/10/2013 1508   ALT 13 03/29/2019 0900   ALT 15 11/10/2013 1508   BILITOT 0.6 03/29/2019 0900   BILITOT 0.43 11/10/2013 1508       RADIOGRAPHIC STUDIES: No results found.  ASSESSMENT AND PLAN: This is a very pleasant 64 years old white male with essential thrombocythemia currently on treatment with hydroxyurea 500 mg by mouth daily except Friday where the patient takes 1000 mg. He continues to tolerate his treatment with hydroxyurea fairly well. The patient has reactive polycythemia and he is currently on phlebotomy on as-needed basis. His hematocrit today is 51.9%. I recommended for him to  proceed with phlebotomy today as planned. He will also continue his treatment with hydroxyurea. I will see him back for follow-up visit in 6 weeks for evaluation with repeat blood work and phlebotomy if needed. He was advised to call immediately if he has any concerning symptoms in the interval. The patient had a lot of questions and I spent a good amount of money with him today answering his questions and addressing his issues. The patient voices understanding of current disease status and treatment options and is in agreement with the current care plan.  All questions were answered. The patient knows to call the clinic with any problems, questions or concerns. We can certainly see the patient much sooner if necessary.   Disclaimer: This note was dictated with voice recognition software. Similar sounding  words can inadvertently be transcribed and may be missed upon review.

## 2019-04-26 NOTE — Patient Instructions (Signed)
Therapeutic Phlebotomy Therapeutic phlebotomy is the planned removal of blood from a person's body for the purpose of treating a medical condition. The procedure is similar to donating blood. Usually, about a pint (470 mL, or 0.47 L) of blood is removed. The average adult has 9-12 pints (4.3-5.7 L) of blood in the body. Therapeutic phlebotomy may be used to treat the following medical conditions:  Hemochromatosis. This is a condition in which the blood contains too much iron.  Polycythemia vera. This is a condition in which the blood contains too many red blood cells.  Porphyria cutanea tarda. This is a disease in which an important part of hemoglobin is not made properly. It results in the buildup of abnormal amounts of porphyrins in the body.  Sickle cell disease. This is a condition in which the red blood cells form an abnormal crescent shape rather than a round shape. Tell a health care provider about:  Any allergies you have.  All medicines you are taking, including vitamins, herbs, eye drops, creams, and over-the-counter medicines.  Any problems you or family members have had with anesthetic medicines.  Any blood disorders you have.  Any surgeries you have had.  Any medical conditions you have.  Whether you are pregnant or may be pregnant. What are the risks? Generally, this is a safe procedure. However, problems may occur, including:  Nausea or light-headedness.  Low blood pressure (hypotension).  Soreness, bleeding, swelling, or bruising at the needle insertion site.  Infection. What happens before the procedure?  Follow instructions from your health care provider about eating or drinking restrictions.  Ask your health care provider about: ? Changing or stopping your regular medicines. This is especially important if you are taking diabetes medicines or blood thinners (anticoagulants). ? Taking medicines such as aspirin and ibuprofen. These medicines can thin your  blood. Do not take these medicines unless your health care provider tells you to take them. ? Taking over-the-counter medicines, vitamins, herbs, and supplements.  Wear clothing with sleeves that can be raised above the elbow.  Plan to have someone take you home from the hospital or clinic.  You may have a blood sample taken.  Your blood pressure, pulse rate, and breathing rate will be measured. What happens during the procedure?   To lower your risk of infection: ? Your health care team will wash or sanitize their hands. ? Your skin will be cleaned with an antiseptic.  You may be given a medicine to numb the area (local anesthetic).  A tourniquet will be placed on your arm.  A needle will be inserted into one of your veins.  Tubing and a collection bag will be attached to that needle.  Blood will flow through the needle and tubing into the collection bag.  The collection bag will be placed lower than your arm to allow gravity to help the flow of blood into the bag.  You may be asked to open and close your hand slowly and continually during the entire collection.  After the specified amount of blood has been removed from your body, the collection bag and tubing will be clamped.  The needle will be removed from your vein.  Pressure will be held on the site of the needle insertion to stop the bleeding.  A bandage (dressing) will be placed over the needle insertion site. The procedure may vary among health care providers and hospitals. What happens after the procedure?  Your blood pressure, pulse rate, and breathing rate will be   measured after the procedure.  You will be encouraged to drink fluids.  Your recovery will be assessed and monitored.  You can return to your normal activities as told by your health care provider. Summary  Therapeutic phlebotomy is the planned removal of blood from a person's body for the purpose of treating a medical condition.  Therapeutic  phlebotomy may be used to treat hemochromatosis, polycythemia vera, porphyria cutanea tarda, or sickle cell disease.  In the procedure, a needle is inserted and about a pint (470 mL, or 0.47 L) of blood is removed. The average adult has 9-12 pints (4.3-5.7 L) of blood in the body.  This is generally a safe procedure, but it can sometimes cause problems such as nausea, light-headedness, or low blood pressure (hypotension). This information is not intended to replace advice given to you by your health care provider. Make sure you discuss any questions you have with your health care provider. Document Revised: 03/27/2017 Document Reviewed: 03/27/2017 Elsevier Patient Education  2020 [employer]  

## 2019-04-28 ENCOUNTER — Telehealth: Payer: Self-pay | Admitting: Internal Medicine

## 2019-04-28 NOTE — Telephone Encounter (Signed)
Scheduled per los. Called and spoke with patient. Confirmed appt 

## 2019-05-11 ENCOUNTER — Encounter: Payer: Self-pay | Admitting: Family Medicine

## 2019-05-11 ENCOUNTER — Ambulatory Visit: Payer: Managed Care, Other (non HMO) | Admitting: Family Medicine

## 2019-05-11 ENCOUNTER — Telehealth: Payer: Self-pay | Admitting: Family Medicine

## 2019-05-11 ENCOUNTER — Other Ambulatory Visit: Payer: Self-pay

## 2019-05-11 VITALS — BP 118/62 | HR 70 | Temp 97.9°F | Ht 70.75 in | Wt 184.0 lb

## 2019-05-11 DIAGNOSIS — Z96653 Presence of artificial knee joint, bilateral: Secondary | ICD-10-CM | POA: Diagnosis not present

## 2019-05-11 DIAGNOSIS — D473 Essential (hemorrhagic) thrombocythemia: Secondary | ICD-10-CM | POA: Diagnosis not present

## 2019-05-11 DIAGNOSIS — I10 Essential (primary) hypertension: Secondary | ICD-10-CM

## 2019-05-11 DIAGNOSIS — E782 Mixed hyperlipidemia: Secondary | ICD-10-CM | POA: Diagnosis not present

## 2019-05-11 DIAGNOSIS — E785 Hyperlipidemia, unspecified: Secondary | ICD-10-CM | POA: Insufficient documentation

## 2019-05-11 LAB — LIPID PANEL
Cholesterol: 147 mg/dL (ref 0–200)
HDL: 38.7 mg/dL — ABNORMAL LOW (ref >39.00)
LDL Cholesterol: 75 mg/dL (ref 0–99)
NonHDL: 107.88
Total CHOL/HDL Ratio: 4
Triglycerides: 163 mg/dL — ABNORMAL HIGH (ref 0.0–149.0)
VLDL: 32.6 mg/dL (ref 0.0–40.0)

## 2019-05-11 MED ORDER — ATORVASTATIN CALCIUM 10 MG PO TABS: 10.0000 mg | ORAL_TABLET | Freq: Every day | ORAL | 3 refills | Status: DC

## 2019-05-11 MED ORDER — LISINOPRIL 10 MG PO TABS: 10.0000 mg | ORAL_TABLET | Freq: Every day | ORAL | 3 refills | Status: DC

## 2019-05-11 NOTE — Assessment & Plan Note (Signed)
Follows with hematology. On hydroxyurea. Doing well.

## 2019-05-11 NOTE — Patient Instructions (Signed)
#  Cholesterol - start Atorvastatin 10 mg for cholesterol - return in 3 months for annual

## 2019-05-11 NOTE — Assessment & Plan Note (Signed)
BP at goal. Continue lisinopril. 

## 2019-05-11 NOTE — Telephone Encounter (Signed)
Patient stated while he was in today he forgot to request a refill on his  Lisinopril 10mg  tablet  He would like this sent to [facility], 3 month supply with refills

## 2019-05-11 NOTE — Telephone Encounter (Signed)
RX refilled and patient advised

## 2019-05-11 NOTE — Assessment & Plan Note (Signed)
Reviewed ASCVD risk of 10%. Already taking ASA 81 mg. Would benefit from statin. Start today, return in 3 months for recheck

## 2019-05-11 NOTE — Progress Notes (Signed)
Subjective:     Dennis Martin is a 64 y.o. male presenting for Transfer of Care (from Dr Deborra Medina)     HPI  #essentially thrombocytosis - taking hydroxyurea  #ED - testosterone - has erythrocytosis and will get phlebotomy prn  - feels fatigued when he is not on the    Review of Systems   Social History   Tobacco Use  Smoking Status Former Smoker  . Packs/day: 1.00  . Years: 25.00  . Pack years: 25.00  . Types: Cigarettes  . Quit date: 05/06/2003  . Years since quitting: 16.0  Smokeless Tobacco Never Used  Tobacco Comment   Quit 2005        Objective:    BP Readings from Last 3 Encounters:  05/11/19 118/62  04/26/19 118/66  04/26/19 133/78   Wt Readings from Last 3 Encounters:  05/11/19 184 lb (83.5 kg)  04/26/19 187 lb (84.8 kg)  03/29/19 189 lb 8 oz (86 kg)    BP 118/62   Pulse 70   Temp 97.9 F (36.6 C)   Ht 5' 10.75" (1.797 m)   Wt 184 lb (83.5 kg)   SpO2 97%   BMI 25.84 kg/m    Physical Exam Constitutional:      Appearance: Normal appearance. He is not ill-appearing or diaphoretic.  HENT:     Right Ear: External ear normal.     Left Ear: External ear normal.     Nose: Nose normal.  Eyes:     General: No scleral icterus.    Extraocular Movements: Extraocular movements intact.     Conjunctiva/sclera: Conjunctivae normal.  Cardiovascular:     Rate and Rhythm: Normal rate and regular rhythm.     Heart sounds: No murmur.  Pulmonary:     Effort: Pulmonary effort is normal. No respiratory distress.     Breath sounds: Normal breath sounds. No wheezing.  Musculoskeletal:     Cervical back: Neck supple.  Skin:    General: Skin is warm and dry.  Neurological:     Mental Status: He is alert. Mental status is at baseline.  Psychiatric:        Mood and Affect: Mood normal.      The 10-year ASCVD risk score Mikey Bussing DC Jr., et al., 2013) is: 10%   Values used to calculate the score:     Age: 55 years     Sex: Male     Is Non-Hispanic  African American: No     Diabetic: No     Tobacco smoker: No     Systolic Blood Pressure: 123456 mmHg     Is BP treated: Yes     HDL Cholesterol: 49.3 mg/dL     Total Cholesterol: 210 mg/dL      Assessment & Plan:   Problem List Items Addressed This Visit      Cardiovascular and Mediastinum   HTN (hypertension) - Primary    BP at goal. Continue lisinopril      Relevant Medications   atorvastatin (LIPITOR) 10 MG tablet     Hematopoietic and Hemostatic   Essential thrombocytosis ([place])    Follows with hematology. On hydroxyurea. Doing well.         Other   History of knee replacement, total, bilateral    Doing well. Follows with ortho. Hx of baker's cyst which makes exercise difficult. Amoxicillin as preventative prior to other procedures      Hyperlipidemia    Reviewed ASCVD risk of 10%. Already  taking ASA 81 mg. Would benefit from statin. Start today, return in 3 months for recheck      Relevant Medications   atorvastatin (LIPITOR) 10 MG tablet   Other Relevant Orders   Lipid panel       Return in about 3 months (around 08/08/2019).  Lesleigh Noe, MD

## 2019-05-11 NOTE — Assessment & Plan Note (Signed)
Doing well. Follows with ortho. Hx of baker's cyst which makes exercise difficult. Amoxicillin as preventative prior to other procedures

## 2019-06-07 ENCOUNTER — Inpatient Hospital Stay: Payer: Managed Care, Other (non HMO)

## 2019-06-07 ENCOUNTER — Inpatient Hospital Stay: Payer: Managed Care, Other (non HMO) | Attending: Internal Medicine

## 2019-06-07 ENCOUNTER — Other Ambulatory Visit: Payer: Self-pay

## 2019-06-07 VITALS — BP 138/76 | HR 80 | Temp 97.9°F | Resp 18

## 2019-06-07 DIAGNOSIS — D751 Secondary polycythemia: Secondary | ICD-10-CM | POA: Insufficient documentation

## 2019-06-07 DIAGNOSIS — D473 Essential (hemorrhagic) thrombocythemia: Secondary | ICD-10-CM

## 2019-06-07 LAB — CMP (CANCER CENTER ONLY)
ALT: 16 U/L (ref 0–44)
AST: 22 U/L (ref 15–41)
Albumin: 4.2 g/dL (ref 3.5–5.0)
Alkaline Phosphatase: 69 U/L (ref 38–126)
Anion gap: 8 (ref 5–15)
BUN: 18 mg/dL (ref 8–23)
CO2: 29 mmol/L (ref 22–32)
Calcium: 8.9 mg/dL (ref 8.9–10.3)
Chloride: 103 mmol/L (ref 98–111)
Creatinine: 1.14 mg/dL (ref 0.61–1.24)
GFR, Est AFR Am: >60 mL/min (ref >60)
GFR, Estimated: >60 mL/min (ref >60)
Glucose, Bld: 73 mg/dL (ref 70–99)
Potassium: 4.2 mmol/L (ref 3.5–5.1)
Sodium: 140 mmol/L (ref 135–145)
Total Bilirubin: 0.8 mg/dL (ref 0.3–1.2)
Total Protein: 6.4 g/dL — ABNORMAL LOW (ref 6.5–8.1)

## 2019-06-07 LAB — CBC WITH DIFFERENTIAL (CANCER CENTER ONLY)
Abs Immature Granulocytes: 0.07 10*3/uL (ref 0.00–0.07)
Basophils Absolute: 0.1 10*3/uL (ref 0.0–0.1)
Basophils Relative: 1 %
Eosinophils Absolute: 0.1 10*3/uL (ref 0.0–0.5)
Eosinophils Relative: 2 %
HCT: 49.3 % (ref 39.0–52.0)
Hemoglobin: 14.9 g/dL (ref 13.0–17.0)
Immature Granulocytes: 1 %
Lymphocytes Relative: 10 %
Lymphs Abs: 0.7 10*3/uL (ref 0.7–4.0)
MCH: 27.8 pg (ref 26.0–34.0)
MCHC: 30.2 g/dL (ref 30.0–36.0)
MCV: 92 fL (ref 80.0–100.0)
Monocytes Absolute: 0.7 10*3/uL (ref 0.1–1.0)
Monocytes Relative: 10 %
Neutro Abs: 5.6 10*3/uL (ref 1.7–7.7)
Neutrophils Relative %: 76 %
Platelet Count: 363 10*3/uL (ref 150–400)
RBC: 5.36 MIL/uL (ref 4.22–5.81)
RDW: 14.9 % (ref 11.5–15.5)
WBC Count: 7.4 10*3/uL (ref 4.0–10.5)
nRBC: 0 % (ref 0.0–0.2)

## 2019-06-07 LAB — IRON AND TIBC
Iron: 29 ug/dL — ABNORMAL LOW (ref 42–163)
Saturation Ratios: 6 % — ABNORMAL LOW (ref 20–55)
TIBC: 469 ug/dL — ABNORMAL HIGH (ref 202–409)
UIBC: 440 ug/dL — ABNORMAL HIGH (ref 117–376)

## 2019-06-07 LAB — LACTATE DEHYDROGENASE: LDH: 199 U/L — ABNORMAL HIGH (ref 98–192)

## 2019-06-07 LAB — FERRITIN: Ferritin: 9 ng/mL — ABNORMAL LOW (ref 24–336)

## 2019-06-07 NOTE — Progress Notes (Signed)
Vital signs taken. Patient denies any complaints. Patient ambulatory to the lobby without assistance.

## 2019-06-07 NOTE — Progress Notes (Signed)
Called Dr. Julien Nordmann and made him aware of today's cbc result. Per Dr. Julien Nordmann proceed with therapeutic phlebotomy today. Phlebotomy started at 0935 and ended at 0940. 536 grams removed. Phlebotomy performed by T. Nils Pyle, Therapist, sports. Patient tolerated well. Snack and beverage offered and accepted by patient.

## 2019-06-07 NOTE — Patient Instructions (Signed)

## 2019-06-09 ENCOUNTER — Telehealth: Payer: Self-pay | Admitting: Internal Medicine

## 2019-06-09 NOTE — Telephone Encounter (Signed)
Scheduled appt per 3/15 sch message - pt aware of appt date and time

## 2019-07-16 ENCOUNTER — Other Ambulatory Visit: Payer: Self-pay | Admitting: *Deleted

## 2019-07-16 DIAGNOSIS — D473 Essential (hemorrhagic) thrombocythemia: Secondary | ICD-10-CM

## 2019-07-19 ENCOUNTER — Telehealth: Payer: Self-pay | Admitting: Internal Medicine

## 2019-07-19 ENCOUNTER — Inpatient Hospital Stay: Payer: Managed Care, Other (non HMO) | Attending: Internal Medicine | Admitting: Internal Medicine

## 2019-07-19 ENCOUNTER — Other Ambulatory Visit: Payer: Self-pay

## 2019-07-19 ENCOUNTER — Inpatient Hospital Stay: Payer: Managed Care, Other (non HMO)

## 2019-07-19 ENCOUNTER — Encounter: Payer: Self-pay | Admitting: Internal Medicine

## 2019-07-19 VITALS — BP 136/75 | HR 78 | Temp 97.8°F | Resp 18 | Ht 70.75 in | Wt 187.4 lb

## 2019-07-19 DIAGNOSIS — E611 Iron deficiency: Secondary | ICD-10-CM | POA: Insufficient documentation

## 2019-07-19 DIAGNOSIS — D751 Secondary polycythemia: Secondary | ICD-10-CM

## 2019-07-19 DIAGNOSIS — R7989 Other specified abnormal findings of blood chemistry: Secondary | ICD-10-CM | POA: Diagnosis not present

## 2019-07-19 DIAGNOSIS — I10 Essential (primary) hypertension: Secondary | ICD-10-CM

## 2019-07-19 DIAGNOSIS — D473 Essential (hemorrhagic) thrombocythemia: Secondary | ICD-10-CM

## 2019-07-19 LAB — CMP (CANCER CENTER ONLY)
ALT: 19 U/L (ref 0–44)
AST: 17 U/L (ref 15–41)
Albumin: 3.9 g/dL (ref 3.5–5.0)
Alkaline Phosphatase: 71 U/L (ref 38–126)
Anion gap: 7 (ref 5–15)
BUN: 15 mg/dL (ref 8–23)
CO2: 30 mmol/L (ref 22–32)
Calcium: 8.8 mg/dL — ABNORMAL LOW (ref 8.9–10.3)
Chloride: 102 mmol/L (ref 98–111)
Creatinine: 1.22 mg/dL (ref 0.61–1.24)
GFR, Est AFR Am: >60 mL/min (ref >60)
GFR, Estimated: >60 mL/min (ref >60)
Glucose, Bld: 74 mg/dL (ref 70–99)
Potassium: 4.4 mmol/L (ref 3.5–5.1)
Sodium: 139 mmol/L (ref 135–145)
Total Bilirubin: 0.7 mg/dL (ref 0.3–1.2)
Total Protein: 6 g/dL — ABNORMAL LOW (ref 6.5–8.1)

## 2019-07-19 LAB — CBC WITH DIFFERENTIAL (CANCER CENTER ONLY)
Abs Immature Granulocytes: 0.05 10*3/uL (ref 0.00–0.07)
Basophils Absolute: 0.1 10*3/uL (ref 0.0–0.1)
Basophils Relative: 1 %
Eosinophils Absolute: 0.1 10*3/uL (ref 0.0–0.5)
Eosinophils Relative: 2 %
HCT: 48.9 % (ref 39.0–52.0)
Hemoglobin: 15.1 g/dL (ref 13.0–17.0)
Immature Granulocytes: 1 %
Lymphocytes Relative: 12 %
Lymphs Abs: 1 10*3/uL (ref 0.7–4.0)
MCH: 28.3 pg (ref 26.0–34.0)
MCHC: 30.9 g/dL (ref 30.0–36.0)
MCV: 91.6 fL (ref 80.0–100.0)
Monocytes Absolute: 0.8 10*3/uL (ref 0.1–1.0)
Monocytes Relative: 9 %
Neutro Abs: 6.2 10*3/uL (ref 1.7–7.7)
Neutrophils Relative %: 75 %
Platelet Count: 328 10*3/uL (ref 150–400)
RBC: 5.34 MIL/uL (ref 4.22–5.81)
RDW: 16.3 % — ABNORMAL HIGH (ref 11.5–15.5)
WBC Count: 8.2 10*3/uL (ref 4.0–10.5)
nRBC: 0 % (ref 0.0–0.2)

## 2019-07-19 LAB — LACTATE DEHYDROGENASE: LDH: 176 U/L (ref 98–192)

## 2019-07-19 NOTE — Telephone Encounter (Signed)
Scheduled per los. Called and spoke with patient. Confirmed appt 

## 2019-07-19 NOTE — Progress Notes (Signed)
[facility] [phone]   Fax:([phone] [place], [place] [address]  DIAGNOSIS:  1) Essential thrombocythemia diagnosed in 2006  2) androgen-induced polycythemia.  PRIOR THERAPY: None   CURRENT THERAPY:  1) Hydroxyurea 500 mg by mouth daily except Friday he is on 1000 mg 2) phlebotomy on as-needed basis.  INTERVAL HISTORY: Dennis Martin 64 y.o. male returns to the clinic today for follow-up visit.  The patient is feeling fine today with no concerning complaints.  He is thinking about stopping the testosterone injection.  He denied having any current chest pain, shortness of breath, cough or hemoptysis.  He denied having any fever or chills.  He has no nausea, vomiting, diarrhea or constipation.  He has no headache or visual changes.  He is here today for evaluation and repeat blood work.  MEDICAL HISTORY: Past Medical History:  Diagnosis Date  . Anxiety    hx of - 10 years ago  . Arthritis    both arthritis, wrist, hands   . BPH (benign prostatic hyperplasia)   . DVT of leg (deep venous thrombosis) ([facility]) 2001   post knee arthroscopy,also complicated by infection, PICC line used for long term antibiotics, was on coumadin x's 6 months  . Essential thrombocytosis ([place] [facility])   . Headache    relative to testosterone level, it had been a problem, improved with clomid  . History of stress test 2008   done in [place] ,nuclear stress test- told that it was normal  . Hypertension   . Peripheral vascular disease ([facility])   . Sleep apnea    did surgery for the problem  . Spleen enlarged   . Thrombocythemia ([facility])    monitored by Dr. Earlie Server    ALLERGIES:  is allergic to keflex [cephalexin].  MEDICATIONS:  Current Outpatient Medications  Medication Sig Dispense Refill  . aspirin EC 81 MG tablet Take 81 mg by mouth 2 (two) times daily.    Marland Kitchen atorvastatin (LIPITOR) 10 MG tablet Take 1 tablet  (10 mg total) by mouth daily. 90 tablet 3  . calcium carbonate (TUMS EX) 750 MG chewable tablet Chew 1 tablet by mouth daily as needed for heartburn.    . folic acid (FOLVITE) A999333 MCG tablet Take 400 mcg by mouth daily.    . hydroxyurea (HYDREA) 500 MG capsule TAKE 1 CAPSULE DAILY EXCEPT TAKE 2 CAPSULES ON FRIDAYS 105 capsule 4  . lisinopril (ZESTRIL) 10 MG tablet Take 1 tablet (10 mg total) by mouth daily. 90 tablet 3  . NON FORMULARY CPAP not sure of setting    . NYSTATIN powder APPLY TOPICALLY FOUR TIMES DAILY 60 g 9  . OVER THE COUNTER MEDICATION Vitafusion men's complete vitamin gummies; pt chews two gummies daily.    . tamsulosin (FLOMAX) 0.4 MG CAPS capsule 0.4 mg. 2 tablets a day at bedtime    . Testosterone (AXIRON) 30 MG/ACT SOLN 60 mg by Pump Prime route daily.    Marland Kitchen amoxicillin (AMOXIL) 500 MG capsule SMARTSIG:4 Capsule(s) By Mouth Once     No current facility-administered medications for this visit.    SURGICAL HISTORY:  Past Surgical History:  Procedure Laterality Date  . EYE SURGERY     chalazion on both eyelids  . KNEE SURGERY Bilateral 2001  . NASAL SINUS SURGERY    . right shoulder surgery    . TONSILLECTOMY    . TOTAL KNEE  ARTHROPLASTY Right 05/22/2015  . TOTAL KNEE ARTHROPLASTY Right 05/22/2015   Procedure: TOTAL KNEE ARTHROPLASTY;  Surgeon: Frederik Pear, MD;  Location: [facility];  Service: Orthopedics;  Laterality: Right;  . TOTAL KNEE ARTHROPLASTY Left 07/17/2015  . TOTAL KNEE ARTHROPLASTY Left 07/17/2015   Procedure: TOTAL KNEE ARTHROPLASTY;  Surgeon: Frederik Pear, MD;  Location: [facility];  Service: Orthopedics;  Laterality: Left;  . TRIGGER FINGER RELEASE Left   . ULNAR NERVE TRANSPOSITION  2007   left    REVIEW OF SYSTEMS:  A comprehensive review of systems was negative except for: Constitutional: positive for fatigue   PHYSICAL EXAMINATION: General appearance: alert, cooperative, fatigued and no distress Head: Normocephalic, without obvious abnormality,  atraumatic Neck: no adenopathy Lymph nodes: Cervical, supraclavicular, and axillary nodes normal. Resp: clear to auscultation bilaterally Back: symmetric, no curvature. ROM normal. No CVA tenderness. Cardio: regular rate and rhythm, S1, S2 normal, no murmur, click, rub or gallop GI: soft, non-tender; bowel sounds normal; no masses,  no organomegaly Extremities: extremities normal, atraumatic, no cyanosis or edema  ECOG PERFORMANCE STATUS: 1 - Symptomatic but completely ambulatory  Blood pressure 136/75, pulse 78, temperature 97.8 F (36.6 C), temperature source Oral, resp. rate 18, height 5' 10.75" (1.797 m), weight 187 lb 6.4 oz (85 kg), SpO2 100 %.  LABORATORY DATA: Lab Results  Component Value Date   WBC 8.2 07/19/2019   HGB 15.1 07/19/2019   HCT 48.9 07/19/2019   MCV 91.6 07/19/2019   PLT 328 07/19/2019      Chemistry      Component Value Date/Time   NA 140 06/07/2019 0855   NA 139 11/10/2013 1508   K 4.2 06/07/2019 0855   K 4.3 11/10/2013 1508   CL 103 06/07/2019 0855   CL 102 05/04/2012 1309   CO2 29 06/07/2019 0855   CO2 28 11/10/2013 1508   BUN 18 06/07/2019 0855   BUN 15.4 11/10/2013 1508   CREATININE 1.14 06/07/2019 0855   CREATININE 1.1 11/10/2013 1508      Component Value Date/Time   CALCIUM 8.9 06/07/2019 0855   CALCIUM 9.6 11/10/2013 1508   ALKPHOS 69 06/07/2019 0855   ALKPHOS 78 11/10/2013 1508   AST 22 06/07/2019 0855   AST 19 11/10/2013 1508   ALT 16 06/07/2019 0855   ALT 15 11/10/2013 1508   BILITOT 0.8 06/07/2019 0855   BILITOT 0.43 11/10/2013 1508       RADIOGRAPHIC STUDIES: No results found.  ASSESSMENT AND PLAN: This is a very pleasant 64 years old white male with essential thrombocythemia currently on treatment with hydroxyurea 500 mg by mouth daily except Friday where the patient takes 1000 mg. 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  His  hematocrit today is 48.9%. I discussed with the patient proceeding with phlebotomy versus observation.  He is planning to discontinue his testosterone injection and we made the decision not to proceed with phlebotomy today because of the significant iron deficiency. For the essential thrombocythemia, he will continue his current treatment with hydroxyurea. The patient mentioned that he may move to the [place] area after he retired recently. I will arrange for him a follow-up appointment in 3 months unless he establish care with a hematologist in [place]. He was advised to call immediately if he has any other concerning symptoms in the interval. The patient had a lot of questions and I spent a good amount of money with him today answering  his questions and addressing his issues. The patient voices understanding of current disease status and treatment options and is in agreement with the current care plan.  All questions were answered. The patient knows to call the clinic with any problems, questions or concerns. We can certainly see the patient much sooner if necessary.   Disclaimer: This note was dictated with voice recognition software. Similar sounding words can inadvertently be transcribed and may be missed upon review.

## 2019-10-13 ENCOUNTER — Encounter: Payer: Self-pay | Admitting: Internal Medicine

## 2019-10-14 ENCOUNTER — Other Ambulatory Visit: Payer: Self-pay | Admitting: Internal Medicine

## 2019-10-14 MED ORDER — HYDROXYUREA 500 MG PO CAPS: ORAL_CAPSULE | ORAL | 4 refills | Status: DC

## 2019-10-15 ENCOUNTER — Telehealth: Payer: Self-pay | Admitting: Internal Medicine

## 2019-10-15 ENCOUNTER — Other Ambulatory Visit: Payer: Self-pay | Admitting: *Deleted

## 2019-10-15 DIAGNOSIS — D751 Secondary polycythemia: Secondary | ICD-10-CM

## 2019-10-15 NOTE — Telephone Encounter (Signed)
lvm for patient - appt rescheduled from 7/26 to 7/28 - per 7/23 sch msg

## 2019-10-15 NOTE — Telephone Encounter (Signed)
Order given for CBC to be drawn at [facility], office visit to be changed to telephone.

## 2019-10-18 ENCOUNTER — Inpatient Hospital Stay: Payer: Self-pay | Admitting: Internal Medicine

## 2019-10-18 ENCOUNTER — Inpatient Hospital Stay: Payer: Self-pay

## 2019-10-20 ENCOUNTER — Encounter: Payer: Self-pay | Admitting: Internal Medicine

## 2019-10-20 ENCOUNTER — Inpatient Hospital Stay: Payer: Self-pay | Admitting: Internal Medicine

## 2019-11-01 ENCOUNTER — Encounter: Payer: Self-pay | Admitting: Family Medicine

## 2019-11-01 DIAGNOSIS — I10 Essential (primary) hypertension: Secondary | ICD-10-CM

## 2019-11-01 DIAGNOSIS — E782 Mixed hyperlipidemia: Secondary | ICD-10-CM

## 2019-11-02 MED ORDER — LISINOPRIL 10 MG PO TABS: 10.0000 mg | ORAL_TABLET | Freq: Every day | ORAL | 3 refills | Status: DC

## 2019-11-02 MED ORDER — ATORVASTATIN CALCIUM 10 MG PO TABS: 10.0000 mg | ORAL_TABLET | Freq: Every day | ORAL | 3 refills | Status: DC

## 2019-12-02 ENCOUNTER — Other Ambulatory Visit: Payer: Self-pay

## 2019-12-02 ENCOUNTER — Ambulatory Visit: Payer: Self-pay

## 2019-12-13 ENCOUNTER — Encounter: Payer: Self-pay | Admitting: Family Medicine

## 2019-12-13 DIAGNOSIS — Z96653 Presence of artificial knee joint, bilateral: Secondary | ICD-10-CM

## 2019-12-13 DIAGNOSIS — M1990 Unspecified osteoarthritis, unspecified site: Secondary | ICD-10-CM

## 2019-12-13 MED ORDER — NAPROXEN 500 MG PO TABS: 500.0000 mg | ORAL_TABLET | Freq: Two times a day (BID) | ORAL | 0 refills | Status: DC

## 2019-12-27 ENCOUNTER — Ambulatory Visit: Payer: 59 | Admitting: Internal Medicine

## 2020-02-03 ENCOUNTER — Encounter: Payer: Self-pay | Admitting: Internal Medicine

## 2020-02-24 ENCOUNTER — Encounter: Payer: Self-pay | Admitting: Internal Medicine

## 2020-02-27 NOTE — Progress Notes (Signed)
HPI M former smoker followed for OSA, complicated by HBP Hx UPPP Unattended home sleep test 08/27/15- AHI 26.6/hour, desaturation to 78%, body weight 177 pounds ---------------------------------------------------------------------------------------------   12/25/2018-  64 year old male followed for OSA. Original diagnosis in the 1990s, treated with UPPP, complicated by BPH, DVT, thrombocytosis/splenomegaly/ phlebotomy. .  CPAP auto 5-15/ [employer] ------OSA on CPAP, DME: [employer]; no complaints Body weight today 182 lbs Download compliance 100%, AHI 0.5/ hr Comfortable with CPAP and no concerns expressed. We discussed life-expectancy of CPAP machine and guidelines for replacement when needed. He had a viral syndrome treated by PCP in February. We discussed Covid precautions, anticipated vaccine and need to wait till specific guidelines are available. He was interested in having Covid antibody test to see if the February illness might have been Covid, understanding it wouldn't change management now. Unknown currently how a positive test would affect advice about any future vaccine.   02/28/20- 64 year old male former smoker(25 pk yrs), followed for OSA. Original diagnosis in the 1990s, treated with UPPP, complicated by BPH, DVT, thrombocytosis/splenomegaly/, secondary Polycythemia, phlebotomy. HTN, PVD, .  CPAP auto 5-15/ [employer] Download- compliance 100%, AHI 2.3/ hr Body weight today- 190 lbs Covid vax-3 Phizer Flu vax-had -----CPAP use is good.  pt is not having any issues       Wife here Doing very well with CPAP. No insurance, so trying to keep current machine working.  Pending phlebotomy for polycythemia.  ROS-see HPI   + = positive Constitutional:    weight loss, night sweats, fevers, chills,+ fatigue, lassitude. HEENT:    headaches, difficulty swallowing, tooth/dental problems, sore throat,       sneezing, itching, ear ache, nasal congestion, post nasal drip, snoring CV:    chest pain,  orthopnea, PND, swelling in lower extremities, anasarca,                                                  dizziness, palpitations Resp:   shortness of breath with exertion or at rest.                productive cough,   non-productive cough, coughing up of blood.              change in color of mucus.  wheezing.   Skin:    rash or lesions. GI:  No-   heartburn, indigestion, abdominal pain, nausea, vomiting, diarrhea,                 change in bowel habits, loss of appetite GU: dysuria, change in color of urine, no urgency or frequency.   flank pain. MS:   +joint pain, stiffness, decreased range of motion, back pain. Neuro-     nothing unusual Psych:  change in mood or affect.  depression or anxiety.   memory loss.  OBJ- Physical Exam General- Alert, Oriented, Affect-appropriate, Distress- none acute, +lean/ fit appearing Skin- rash-none, lesions- none, excoriation- none Lymphadenopathy- none Head- atraumatic            Eyes- Gross vision intact, PERRLA, conjunctivae and secretions clear            Ears- Hearing, canals-normal            Nose- Clear, no-Septal dev, mucus, polyps, erosion, perforation             Throat- Mallampati II/ UPPP ,  mucosa clear- not dry , drainage- none, tonsils- atrophic Neck- flexible , trachea midline, no stridor , thyroid nl, carotid no bruit Chest - symmetrical excursion , unlabored           Heart/CV- RRR , no murmur , no gallop  , no rub, nl s1 s2                           - JVD- none , edema- none, stasis changes- none, varices- none           Lung- clear to P&A, wheeze- none, cough- none , dullness-none, rub- none           Chest wall-  Abd-  Br/ Gen/ Rectal- Not done, not indicated Extrem- cyanosis- none, clubbing, none, atrophy- none, strength- nl Neuro- grossly intact to observation

## 2020-02-28 ENCOUNTER — Other Ambulatory Visit: Payer: Self-pay

## 2020-02-28 ENCOUNTER — Ambulatory Visit (INDEPENDENT_AMBULATORY_CARE_PROVIDER_SITE_OTHER): Payer: Self-pay | Admitting: Internal Medicine

## 2020-02-28 ENCOUNTER — Encounter: Payer: Self-pay | Admitting: Internal Medicine

## 2020-02-28 DIAGNOSIS — G4733 Obstructive sleep apnea (adult) (pediatric): Secondary | ICD-10-CM

## 2020-02-28 DIAGNOSIS — D751 Secondary polycythemia: Secondary | ICD-10-CM

## 2020-02-28 NOTE — Patient Instructions (Addendum)
We can continue CPAP auto 5-15, mask of choice, humidifier, supplies  [facility]/ card  Please call if we can help

## 2020-02-29 ENCOUNTER — Encounter: Payer: Self-pay | Admitting: Internal Medicine

## 2020-02-29 NOTE — Assessment & Plan Note (Signed)
Hopefully with CPAP, his sleep O2 sat is well-enough maintained as not to be aggravating his polycythemia.

## 2020-02-29 NOTE — Assessment & Plan Note (Signed)
Benefits from CPAP with good compliance and control. Wife confirms no significant sleep disturbance.

## 2020-09-15 IMAGING — DX DG CHEST 2V
2 series · 2 of 2 positions shown · non-contrast
Comparison: 12/10/2016

CLINICAL DATA: Shortness of breath, chest discomfort and productive
former smoker

EXAM:
CHEST - 2 VIEW

[chest pa]
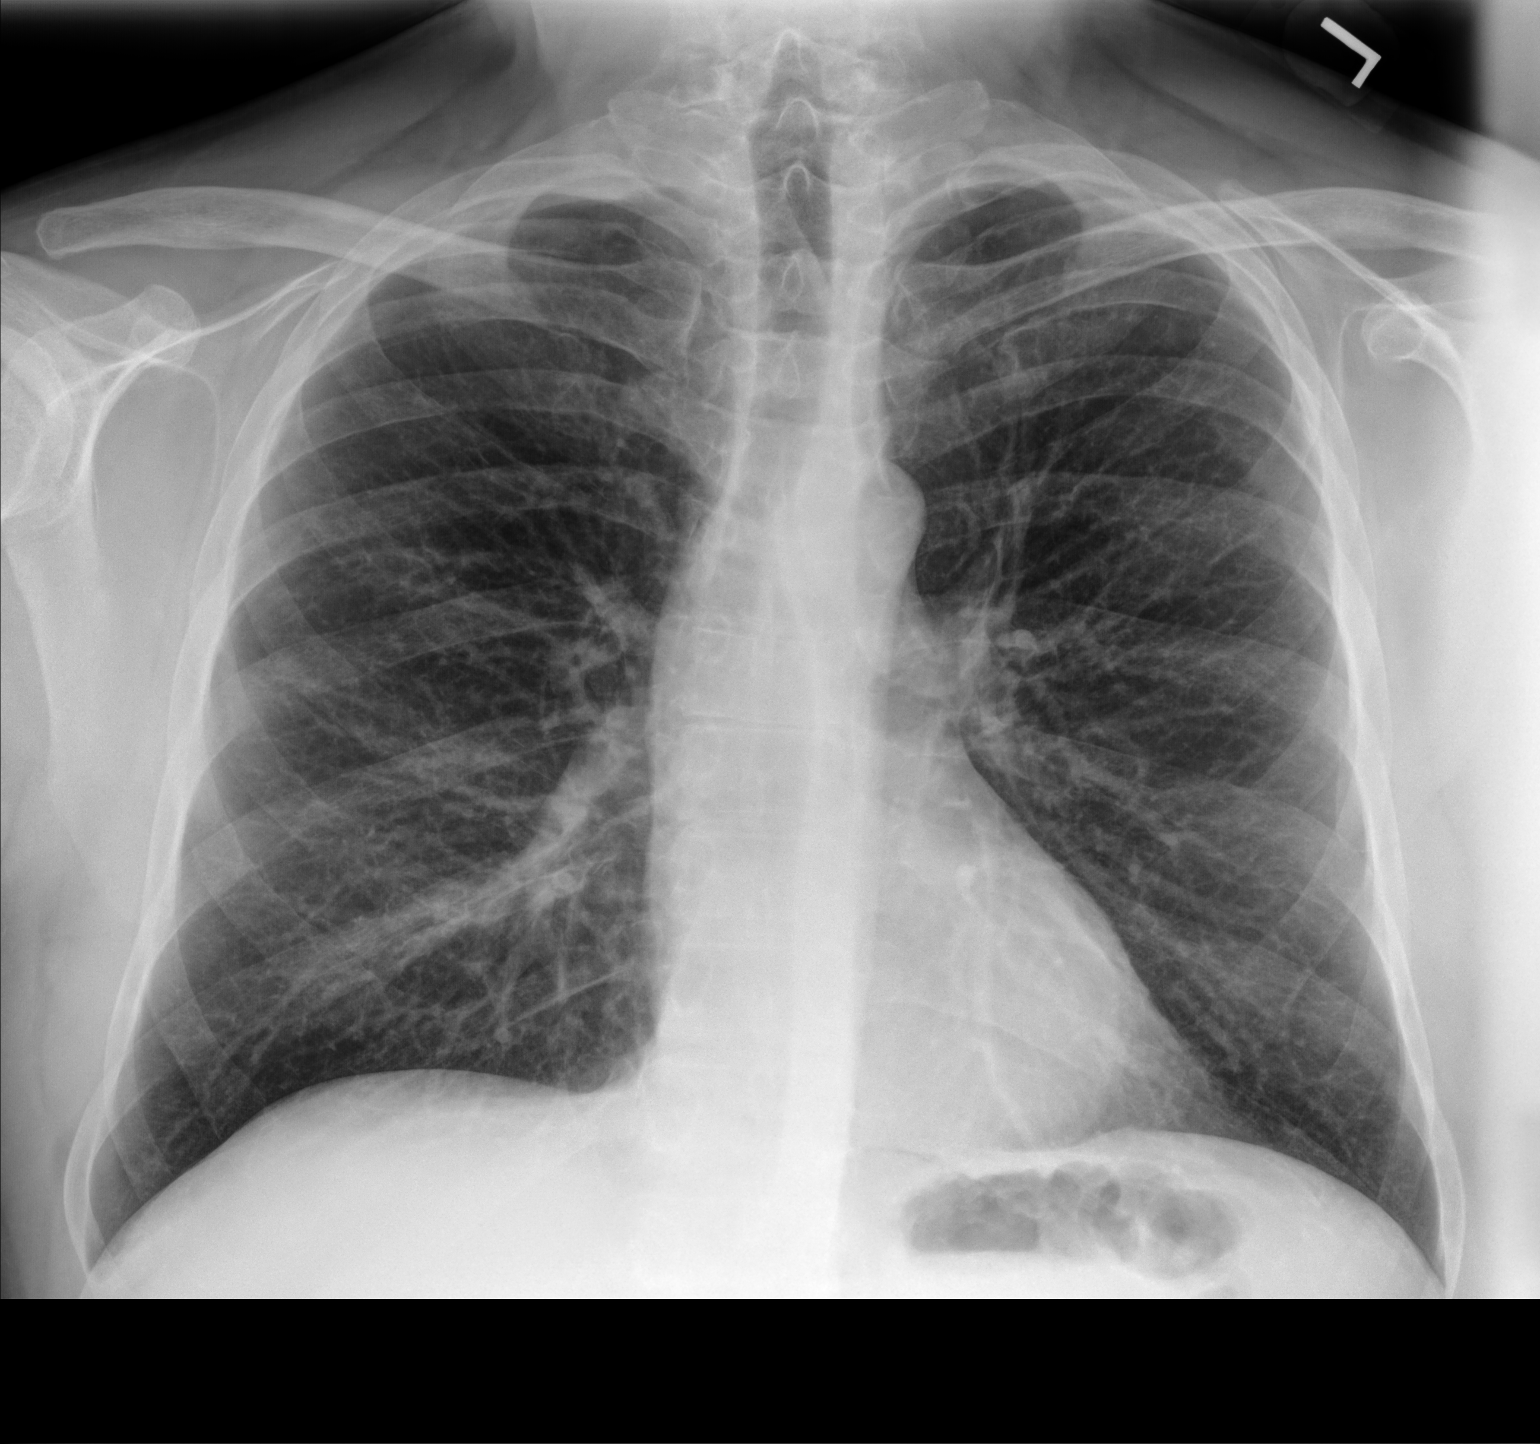

[chest lat]
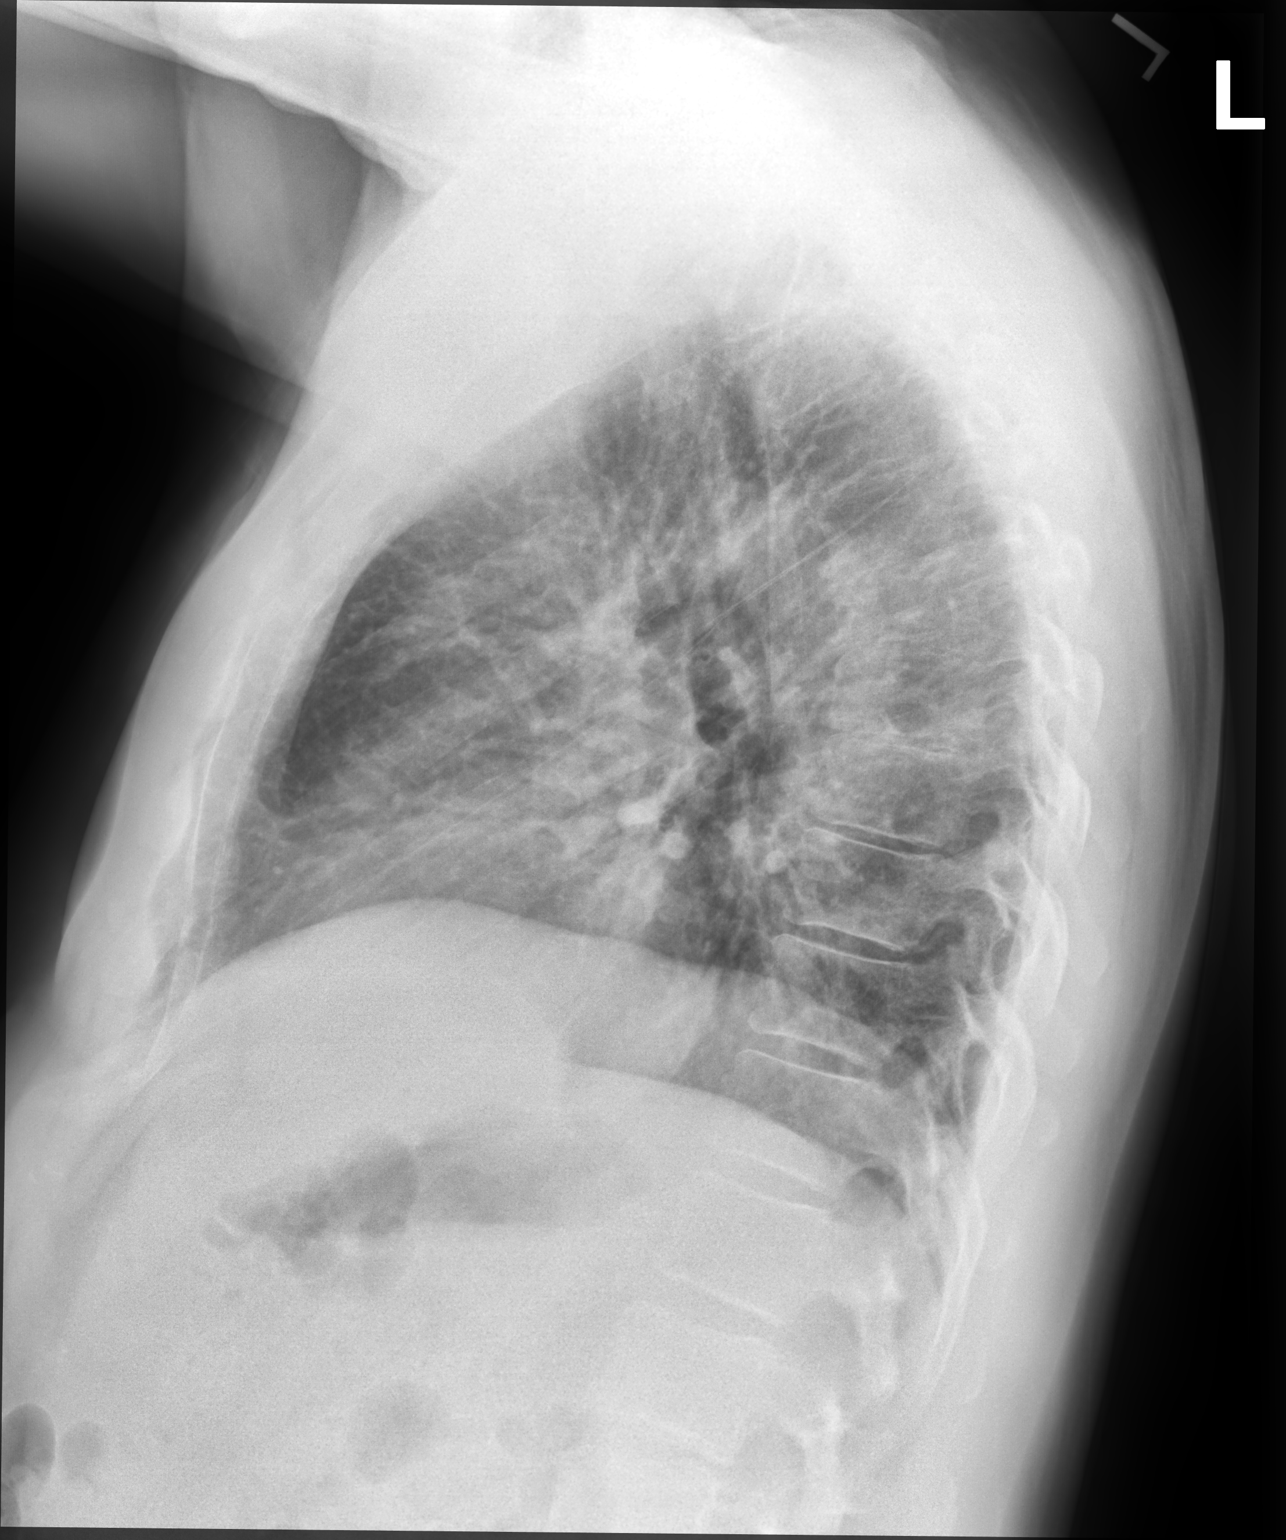

[2 of 2 positions shown; findings below may reference images not displayed]

FINDINGS: Normal heart size, mediastinal contours, and pulmonary vascularity.

Lungs clear.

No infiltrate, pleural effusion or pneumothorax.

Bones unremarkable.
IMPRESSION: No acute abnormalities.

## 2020-10-31 ENCOUNTER — Telehealth: Payer: Self-pay | Admitting: Family Medicine

## 2020-10-31 DIAGNOSIS — I10 Essential (primary) hypertension: Secondary | ICD-10-CM

## 2020-11-01 NOTE — Telephone Encounter (Signed)
Called Mr.Jaxan got him scheduled 9/6 for labs so he can discuss the results with Dr. Einar Pheasant and 9/12 for his CPE.

## 2020-11-03 MED ORDER — LISINOPRIL 10 MG PO TABS: 10.0000 mg | ORAL_TABLET | Freq: Every day | ORAL | 0 refills | Status: DC

## 2020-11-03 NOTE — Addendum Note (Signed)
Addended by: Loreen Freud on: 11/03/2020 04:46 PM   Modules accepted: Orders

## 2020-11-13 ENCOUNTER — Other Ambulatory Visit: Payer: Self-pay | Admitting: Family Medicine

## 2020-11-13 DIAGNOSIS — D473 Essential (hemorrhagic) thrombocythemia: Secondary | ICD-10-CM

## 2020-11-13 DIAGNOSIS — D751 Secondary polycythemia: Secondary | ICD-10-CM

## 2020-11-13 DIAGNOSIS — Z125 Encounter for screening for malignant neoplasm of prostate: Secondary | ICD-10-CM

## 2020-11-13 DIAGNOSIS — I10 Essential (primary) hypertension: Secondary | ICD-10-CM

## 2020-11-13 DIAGNOSIS — E782 Mixed hyperlipidemia: Secondary | ICD-10-CM

## 2020-11-13 NOTE — Progress Notes (Signed)
Please see if patients wants prostate cancer screening.   Typically I discuss at appointment but as patient requested labs prior to office visit unable to have discussion

## 2020-11-28 ENCOUNTER — Other Ambulatory Visit: Payer: Self-pay

## 2020-11-28 ENCOUNTER — Other Ambulatory Visit (INDEPENDENT_AMBULATORY_CARE_PROVIDER_SITE_OTHER): Payer: Self-pay

## 2020-11-28 DIAGNOSIS — I10 Essential (primary) hypertension: Secondary | ICD-10-CM

## 2020-11-28 DIAGNOSIS — E782 Mixed hyperlipidemia: Secondary | ICD-10-CM

## 2020-11-28 DIAGNOSIS — D751 Secondary polycythemia: Secondary | ICD-10-CM

## 2020-11-28 DIAGNOSIS — D473 Essential (hemorrhagic) thrombocythemia: Secondary | ICD-10-CM

## 2020-11-28 LAB — COMPREHENSIVE METABOLIC PANEL
ALT: 15 U/L (ref 0–53)
AST: 20 U/L (ref 0–37)
Albumin: 4.2 g/dL (ref 3.5–5.2)
Alkaline Phosphatase: 57 U/L (ref 39–117)
BUN: 16 mg/dL (ref 6–23)
CO2: 30 mEq/L (ref 19–32)
Calcium: 9.1 mg/dL (ref 8.4–10.5)
Chloride: 103 mEq/L (ref 96–112)
Creatinine, Ser: 1.17 mg/dL (ref 0.40–1.50)
GFR: 66.4 mL/min (ref >60.00)
Glucose, Bld: 92 mg/dL (ref 70–99)
Potassium: 4.9 mEq/L (ref 3.5–5.1)
Sodium: 139 mEq/L (ref 135–145)
Total Bilirubin: 1 mg/dL (ref 0.2–1.2)
Total Protein: 6.2 g/dL (ref 6.0–8.3)

## 2020-11-28 LAB — LIPID PANEL
Cholesterol: 145 mg/dL (ref 0–200)
HDL: 41.3 mg/dL (ref >39.00)
LDL Cholesterol: 87 mg/dL (ref 0–99)
NonHDL: 103.38
Total CHOL/HDL Ratio: 4
Triglycerides: 80 mg/dL (ref 0.0–149.0)
VLDL: 16 mg/dL (ref 0.0–40.0)

## 2020-11-28 LAB — CBC
HCT: 49.9 % (ref 39.0–52.0)
Hemoglobin: 15.6 g/dL (ref 13.0–17.0)
MCHC: 31.3 g/dL (ref 30.0–36.0)
MCV: 90.6 fl (ref 78.0–100.0)
Platelets: 309 10*3/uL (ref 150.0–400.0)
RBC: 5.5 Mil/uL (ref 4.22–5.81)
RDW: 16.1 % — ABNORMAL HIGH (ref 11.5–15.5)
WBC: 6.8 10*3/uL (ref 4.0–10.5)

## 2020-12-04 ENCOUNTER — Encounter: Payer: Self-pay | Admitting: Family Medicine

## 2020-12-04 ENCOUNTER — Other Ambulatory Visit: Payer: Self-pay

## 2020-12-04 ENCOUNTER — Ambulatory Visit (INDEPENDENT_AMBULATORY_CARE_PROVIDER_SITE_OTHER): Payer: Self-pay | Admitting: Family Medicine

## 2020-12-04 VITALS — BP 118/72 | HR 88 | Temp 97.0°F | Ht 70.5 in | Wt 181.5 lb

## 2020-12-04 DIAGNOSIS — Z0000 Encounter for general adult medical examination without abnormal findings: Secondary | ICD-10-CM

## 2020-12-04 DIAGNOSIS — I10 Essential (primary) hypertension: Secondary | ICD-10-CM

## 2020-12-04 MED ORDER — ATORVASTATIN CALCIUM 10 MG PO TABS: 10.0000 mg | ORAL_TABLET | Freq: Every day | ORAL | 3 refills | Status: DC

## 2020-12-04 MED ORDER — LISINOPRIL 10 MG PO TABS: 10.0000 mg | ORAL_TABLET | Freq: Every day | ORAL | 3 refills | Status: DC

## 2020-12-04 NOTE — Patient Instructions (Addendum)
Coronary Calcium Score - imaging test $150  - consider getting if you wanted to make sure you were really benefiting from the cholesterol

## 2020-12-04 NOTE — Progress Notes (Signed)
Annual Exam   Chief Complaint:  Chief Complaint  Patient presents with   Annual Exam    History of Present Illness:  Dennis Martin is a 65 y.o. presents today for annual examination.     Nutrition/Lifestyle Diet: mediterrean diet, chicken/pork, cooked veggies Exercise: yoga - modified and stationary bike He is single partner, contraception - post menopausal status.  Any issues with getting or keeping erection? Yes  Social History   Tobacco Use  Smoking Status Former   Packs/day: 1.00   Years: 25.00   Pack years: 25.00   Types: Cigarettes   Quit date: 05/06/2003   Years since quitting: 17.5  Smokeless Tobacco Never  Tobacco Comments   Quit 2005   Social History   Substance and Sexual Activity  Alcohol Use Yes   Alcohol/week: 14.0 standard drinks   Types: 14 Cans of beer per week   Comment: 2 beers daily   Social History   Substance and Sexual Activity  Drug Use No     Safety The patient wears seatbelts: yes.     The patient feels safe at home and in their relationships: yes.  General Health Dentist in the last year: Yes Eye doctor: no  Weight Wt Readings from Last 3 Encounters:  12/04/20 181 lb 8 oz (82.3 kg)  02/28/20 190 lb 4 oz (86.3 kg)  07/19/19 187 lb 6.4 oz (85 kg)   Patient has normal BMI  BMI Readings from Last 1 Encounters:  12/04/20 25.67 kg/m     Chronic disease screening Blood pressure monitoring:  BP Readings from Last 3 Encounters:  12/04/20 118/72  02/28/20 128/68  07/19/19 136/75    Lipid Monitoring: Indication for screening: age >35, obesity, diabetes, family hx, CV risk factors.  Lipid screening: Yes  Lab Results  Component Value Date   CHOL 145 11/28/2020   HDL 41.30 11/28/2020   LDLCALC 87 11/28/2020   TRIG 80.0 11/28/2020   CHOLHDL 4 11/28/2020     Diabetes Screening: age >44, overweight, family hx, PCOS, hx of gestational diabetes, at risk ethnicity, elevated blood pressure >135/80.  Diabetes Screening  screening: Yes  No results found for: HGBA1C   Prostate Cancer Screening: Yes Age 28-69 yo Shared Decision Making Higher Risk: Older age, African American, Family Hx of Prostate Cancer - No Benefits: screening may prevent 1.3 deaths from prostate cancer over 13 years per 1000 men screened and prevent 3 metastatic cases per 1000 men screened. Not enough evidence to support more benefit for AA or [place] Harms: False Positive and psychological harms. 15% of me with false positive over a 2 to 4 year period > resulting in biopsy and complications such as pain, hematospermia, infections. Overdiagnosis - increases with age - found that 20-50% of prostate cancer through screening may have never caused any issues. Harms of treatment include - erectile dysfunction, urinary incontinence, and bothersome bowel symptoms.   After discussion he does want to get a PSA checked today.   Inadequate evidence for screening <55 No mortality benefit for screening >70   Lab Results  Component Value Date   PSA 0.78 06/30/2018   PSA 0.58 12/23/2017   PSA 0.64 06/24/2017       Colon Cancer Screening:  Age 68-75 yo - benefits outweigh the risk. Adults 45-85 yo who have never been screened benefit.  Benefits: 134000 people in 2016 will be diagnosed and 49,000 will die - early detection helps Harms: Complications 2/2 to colonoscopy High Risk (Colonoscopy): genetic disorder (Lynch syndrome  or familial adenomatous polyposis), personal hx of IBD, previous adenomatous polyp, or previous colorectal cancer, FamHx start 10 years before the age at diagnosis, increased in males and black race  Options:  FIT - looks for hemoglobin (blood in the stool) - specific and fairly sensitive - must be done annually Cologuard - looks for DNA and blood - more sensitive - therefore can have more false positives, every 3 years Colonoscopy - every 10 years if normal - sedation, bowl prep, must have someone drive you  Shared decision  making and the patient had decided to do colonscopy 2014 - due in 10 years.   Social History   Tobacco Use  Smoking Status Former   Packs/day: 1.00   Years: 25.00   Pack years: 25.00   Types: Cigarettes   Quit date: 05/06/2003   Years since quitting: 17.5  Smokeless Tobacco Never  Tobacco Comments   Quit 2005    Lung Cancer Screening (Ages 123456): not applicable 20 year pack history? Yes Current Tobacco user? No Quit less than 15 years ago? No Interested in low dose CT for lung cancer screening? no  Abdominal Aortic Aneurysm:  Age 25-75, 1 time screening, men who have ever smoked not done for age    Past Medical History:  Diagnosis Date   Anxiety    hx of - 10 years ago   Arthritis    both arthritis, wrist, hands    BPH (benign prostatic hyperplasia)    DVT of leg (deep venous thrombosis) ([place]) 2001   post knee arthroscopy,also complicated by infection, PICC line used for long term antibiotics, was on coumadin x's 6 months   Essential thrombocytosis ([place])    Headache    relative to testosterone level, it had been a problem, improved with clomid   History of stress test 2008   done in [place] ,nuclear stress test- told that it was normal   Hypertension    Peripheral vascular disease ([place])    Sleep apnea    did surgery for the problem   Spleen enlarged    Thrombocythemia    monitored by Dr. Earlie Server    Past Surgical History:  Procedure Laterality Date   EYE SURGERY     chalazion on both eyelids   KNEE SURGERY Bilateral 2001   NASAL SINUS SURGERY     right shoulder surgery     TONSILLECTOMY     TOTAL KNEE ARTHROPLASTY Right 05/22/2015   TOTAL KNEE ARTHROPLASTY Right 05/22/2015   Procedure: TOTAL KNEE ARTHROPLASTY;  Surgeon: Frederik Pear, MD;  Location: [facility];  Service: Orthopedics;  Laterality: Right;   TOTAL KNEE ARTHROPLASTY Left 07/17/2015   TOTAL KNEE ARTHROPLASTY Left 07/17/2015   Procedure: TOTAL KNEE ARTHROPLASTY;  Surgeon: Frederik Pear, MD;   Location: [facility];  Service: Orthopedics;  Laterality: Left;   TRIGGER FINGER RELEASE Left    ULNAR NERVE TRANSPOSITION  2007   left    Prior to Admission medications   Medication Sig Start Date End Date Taking? Authorizing Provider  amoxicillin (AMOXIL) 500 MG capsule SMARTSIG:4 Capsule(s) By Mouth Once 03/17/19  Yes [provider]  aspirin EC 81 MG tablet Take 81 mg by mouth 2 (two) times daily.   Yes [provider]  calcium carbonate (TUMS EX) 750 MG chewable tablet Chew 1 tablet by mouth daily as needed for heartburn.   Yes [provider]  Flaxseed, Linseed, (FLAXSEED OIL) 1000 MG CAPS Take 1,000 mg by mouth in the morning. 01/13/20  Yes [provider]  folic acid (FOLVITE) A999333 MCG tablet Take 400 mcg by mouth daily.   Yes [provider]  hydroxyurea (HYDREA) 500 MG capsule TAKE 1 CAPSULE DAILY EXCEPT TAKE 2 CAPSULES ON FRIDAYS 10/14/19  Yes Curt Bears, MD  lisinopril (ZESTRIL) 10 MG tablet Take 1 tablet (10 mg total) by mouth daily. 11/03/20  Yes Lesleigh Noe, MD  melatonin 5 MG TABS Take 5 mg by mouth at bedtime. 01/13/20  Yes [provider]  NON FORMULARY CPAP not sure of setting   Yes [provider]  NON FORMULARY Place 10 mg under the tongue in the morning, at noon, and at bedtime. Delta 8 Tincture 10 mg / .5m 01/13/20  Yes [provider]  NYSTATIN powder AHeidelbergPatient taking differently: Apply 1 application topically as needed. 10/13/17  Yes ALucille Passy MD  OVER THE COUNTER MEDICATION Vitafusion men's complete vitamin gummies; pt chews two gummies daily.   Yes [provider]  tamsulosin (FLOMAX) 0.4 MG CAPS capsule 0.4 mg. 2 tablets a day at bedtime 07/07/18  Yes [provider]  Testosterone Cypionate 200 MG/ML SOLN Inject 1 mL into the muscle every 14 (fourteen) days. 06/13/20  Yes [provider]    Allergies  Allergen Reactions   Keflex  [Cephalexin] Rash     Social History   Socioeconomic History   Marital status: Married    Spouse name: BEconomist  Number of children: 2   Years of education: high school   Highest education level: Not on file  Occupational History   Not on file  Tobacco Use   Smoking status: Former    Packs/day: 1.00    Years: 25.00    Pack years: 25.00    Types: Cigarettes    Quit date: 05/06/2003    Years since quitting: 17.5   Smokeless tobacco: Never   Tobacco comments:    Quit 2005  Vaping Use   Vaping Use: Never used  Substance and Sexual Activity   Alcohol use: Yes    Alcohol/week: 14.0 standard drinks    Types: 14 Cans of beer per week    Comment: 2 beers daily   Drug use: No   Sexual activity: Yes    Birth control/protection: Post-menopausal  Other Topics Concern   Not on file  Social History Narrative   05/11/19   [place] Pitcher wife since 2002   Work: data center management work      Family: 2 sons - CHarrell Gaveand KLennette Bihari- no grandchildren       Enjoys: target shooting      Exercise: multiple joint issues has limited   Diet: normal      Safety   Seat belts: Yes    Guns: Yes  and secure   Safe in relationships: Yes    Social Determinants of HRadio broadcast assistantStrain: Not on file  Food Insecurity: Not on file  Transportation Needs: Not on file  Physical Activity: Not on file  Stress: Not on file  Social Connections: Not on file  Intimate Partner Violence: Not on file    Family History  Problem Relation Age of Onset   Heart attack Mother 892  Other Brother        brain aneyurism   Colon cancer Paternal Aunt 462  Stomach cancer Neg Hx     Review  of Systems  Constitutional:  Negative for chills and fever.  HENT:  Negative for congestion and sore throat.   Eyes:  Negative for blurred vision and double vision.  Respiratory:  Negative for shortness of breath.   Cardiovascular:  Negative for  chest pain.  Gastrointestinal:  Negative for heartburn, nausea and vomiting.  Genitourinary: Negative.   Musculoskeletal: Negative.  Negative for myalgias.  Skin:  Negative for rash.  Neurological:  Negative for dizziness and headaches.  Endo/Heme/Allergies:  Does not bruise/bleed easily.  Psychiatric/Behavioral:  Negative for depression. The patient is not nervous/anxious.     Physical Exam BP 118/72   Pulse 88   Temp (!) 97 F (36.1 C) (Temporal)   Ht 5' 10.5" (1.791 m)   Wt 181 lb 8 oz (82.3 kg)   SpO2 97%   BMI 25.67 kg/m    BP Readings from Last 3 Encounters:  12/04/20 118/72  02/28/20 128/68  07/19/19 136/75      Physical Exam Constitutional:      General: He is not in acute distress.    Appearance: He is well-developed. He is not diaphoretic.  HENT:     Head: Normocephalic and atraumatic.     Right Ear: Tympanic membrane and ear canal normal.     Left Ear: Tympanic membrane and ear canal normal.     Nose: Nose normal.     Mouth/Throat:     Pharynx: Uvula midline.  Eyes:     General: No scleral icterus.    Conjunctiva/sclera: Conjunctivae normal.     Pupils: Pupils are equal, round, and reactive to light.  Cardiovascular:     Rate and Rhythm: Normal rate and regular rhythm.     Heart sounds: Normal heart sounds. No murmur heard. Pulmonary:     Effort: Pulmonary effort is normal. No respiratory distress.     Breath sounds: Normal breath sounds. No wheezing.  Abdominal:     General: Bowel sounds are normal. There is no distension.     Palpations: Abdomen is soft. There is no mass.     Tenderness: There is no abdominal tenderness. There is no guarding.  Musculoskeletal:        General: Normal range of motion.     Cervical back: Normal range of motion and neck supple.  Lymphadenopathy:     Cervical: No cervical adenopathy.  Skin:    General: Skin is warm and dry.     Capillary Refill: Capillary refill takes less than 2 seconds.  Neurological:     Mental  Status: He is alert and oriented to person, place, and time.       Results:  PHQ-9:  Depression screen [facility] 2/9 05/27/2018 05/21/2017 05/13/2016  Decreased Interest 0 0 0  Down, Depressed, Hopeless 0 0 0  PHQ - 2 Score 0 0 0       Assessment: 65 y.o. here for routine annual physical examination.  Plan: Problem List Items Addressed This Visit   None Visit Diagnoses     Annual physical exam    -  Primary   Essential hypertension       Relevant Medications   lisinopril (ZESTRIL) 10 MG tablet   atorvastatin (LIPITOR) 10 MG tablet       Screening: -- Blood pressure screen normal -- cholesterol screening:  restart statin -- Weight screening: normal -- Diabetes Screening: not due for screening -- Nutrition: normal - Encouraged healthy diet  The 10-year ASCVD risk score (Arnett DK, et al., 2019) is:  9.7%   Values used to calculate the score:     Age: 59 years     Sex: Male     Is Non-Hispanic African American: No     Diabetic: No     Tobacco smoker: No     Systolic Blood Pressure: 123456 mmHg     Is BP treated: Yes     HDL Cholesterol: 41.3 mg/dL     Total Cholesterol: 145 mg/dL  -- ASA 81 mg discussed if CVD risk >10% age 50-59 and willing to take for 10 years -- Statin therapy for Age 36-75 with CVD risk >7.5%  Psych -- Depression screening (PHQ-9): negative  Safety -- tobacco screening: not using -- alcohol screening:  low-risk usage. -- no evidence of domestic violence or intimate partner violence.  Cancer Screening -- Prostate (age 77-69)  will get urology -- Colon (age 81-75)  up to date -- Lung not indicated   Immunizations Immunization History  Administered Date(s) Administered   Influenza Split 12/25/2011   Influenza,inj,Quad PF,6+ Mos 12/16/2012, 12/29/2013, 12/09/2014, 12/06/2015, 12/12/2016, 12/13/2017, 11/26/2018, 11/04/2020   Influenza-Unspecified 12/13/2017   PFIZER(Purple Top)SARS-COV-2 Vaccination 07/24/2019, 08/14/2019, 11/08/2019,  10/20/2020   Tdap 04/07/2013   Zoster Recombinat (Shingrix) 05/21/2017, 07/15/2017   Zoster, Live 02/09/2013    -- flu vaccine up to date -- TDAP q10 years up to date -- Shingles (age >66) up to date -- PPSV-23 (19-64 with chronic disease or smoking) declined -- Covid-19 Vaccine up to date  Encouraged regular vision and dental screening. Encouraged healthy exercise and diet.   Lesleigh Noe

## 2020-12-05 ENCOUNTER — Telehealth: Payer: Self-pay | Admitting: Family Medicine

## 2020-12-05 NOTE — Telephone Encounter (Signed)
Patient called in requesting a call back. # [phone] Regarding his wife care with memory loss and a letter of  excuse for [facility] because of wife care

## 2020-12-05 NOTE — Telephone Encounter (Signed)
Letter completed and should be available on mychart  If he needs copy please print and leave up front for patient

## 2020-12-06 NOTE — Telephone Encounter (Signed)
Hard copy printed and left up front for pt.

## 2020-12-21 ENCOUNTER — Other Ambulatory Visit: Payer: Self-pay | Admitting: Internal Medicine

## 2020-12-21 MED ORDER — HYDROXYUREA 500 MG PO CAPS: ORAL_CAPSULE | ORAL | 4 refills | Status: DC

## 2021-02-08 ENCOUNTER — Encounter: Payer: Self-pay | Admitting: Family Medicine

## 2021-02-28 NOTE — Progress Notes (Signed)
HPI M former smoker followed for OSA, complicated by HBP Hx UPPP Unattended home sleep test 08/27/15- AHI 26.6/hour, desaturation to 78%, body weight 177 pounds ---------------------------------------------------------------------------------------------   02/28/20- 65 year old male former smoker(25 pk yrs), followed for OSA. Original diagnosis in the 1990s, treated with UPPP, complicated by BPH, DVT, thrombocytosis/splenomegaly/, secondary Polycythemia, phlebotomy. HTN, PVD,  CPAP auto 5-15/ [employer] Download- compliance 100%, AHI 2.3/ hr Body weight today- 190 lbs Covid vax-3 Phizer Flu vax-had -----CPAP use is good.  pt is not having any issues       Wife here Doing very well with CPAP. No insurance, so trying to keep current machine working.  Pending phlebotomy for polycythemia.  03/01/21- 65 year old male former smoker(25 pk yrs), followed for OSA. Original diagnosis in the 1990s, treated with UPPP, complicated by BPH, DVT, thrombocytosis/splenomegaly/, secondary Polycythemia, phlebotomy. HTN, PVD,  CPAP auto 5-15/ [employer] Download- compliance 93%, AHI 1.3/ hr Body weight today 182 lbs Covid vax-4 Phizer, 1 Moderna Flu vax had -----Patient is here for 1 year follow up on OSA.  He has no insurance.  Current machine is about 63 years old and he asked about replacing it.  Current machine is working very well mechanically.  Download reviewed with him.  I explained how he can use a print prescription with a DME company or online.  ROS-see HPI   + = positive Constitutional:    weight loss, night sweats, fevers, chills,+ fatigue, lassitude. HEENT:    headaches, difficulty swallowing, tooth/dental problems, sore throat,       sneezing, itching, ear ache, nasal congestion, post nasal drip, snoring CV:    chest pain, orthopnea, PND, swelling in lower extremities, anasarca,                                                  dizziness, palpitations Resp:   shortness of breath with exertion or at rest.                 productive cough,   non-productive cough, coughing up of blood.              change in color of mucus.  wheezing.   Skin:    rash or lesions. GI:  No-   heartburn, indigestion, abdominal pain, nausea, vomiting, diarrhea,                 change in bowel habits, loss of appetite GU: dysuria, change in color of urine, no urgency or frequency.   flank pain. MS:   +joint pain, stiffness, decreased range of motion, back pain. Neuro-     nothing unusual Psych:  change in mood or affect.  depression or anxiety.   memory loss.  OBJ- Physical Exam General- Alert, Oriented, Affect-appropriate, Distress- none acute, +lean/ fit appearing Skin- rash-none, lesions- none, excoriation- none Lymphadenopathy- none Head- atraumatic            Eyes- Gross vision intact, PERRLA, conjunctivae and secretions clear            Ears- Hearing, canals-normal            Nose- Clear, no-Septal dev, mucus, polyps, erosion, perforation             Throat- Mallampati II/ UPPP , mucosa clear- not dry , drainage- none, tonsils- atrophic Neck- flexible , trachea midline, no stridor , thyroid  nl, carotid no bruit Chest - symmetrical excursion , unlabored           Heart/CV- RRR , no murmur , no gallop  , no rub, nl s1 s2                           - JVD- none , edema- none, stasis changes- none, varices- none           Lung- clear to P&A, wheeze- none, cough- none , dullness-none, rub- none           Chest wall-  Abd-  Br/ Gen/ Rectal- Not done, not indicated Extrem- cyanosis- none, clubbing, none, atrophy- none, strength- nl Neuro- grossly intact to observation

## 2021-03-01 ENCOUNTER — Other Ambulatory Visit: Payer: Self-pay

## 2021-03-01 ENCOUNTER — Ambulatory Visit (INDEPENDENT_AMBULATORY_CARE_PROVIDER_SITE_OTHER): Payer: Self-pay | Admitting: Internal Medicine

## 2021-03-01 ENCOUNTER — Encounter: Payer: Self-pay | Admitting: Internal Medicine

## 2021-03-01 VITALS — BP 114/70 | HR 76 | Temp 97.9°F | Ht 70.5 in | Wt 182.0 lb

## 2021-03-01 DIAGNOSIS — G4733 Obstructive sleep apnea (adult) (pediatric): Secondary | ICD-10-CM

## 2021-03-01 DIAGNOSIS — I10 Essential (primary) hypertension: Secondary | ICD-10-CM

## 2021-03-01 NOTE — Assessment & Plan Note (Signed)
Benefits from CPAP with good compliance and control. Plan-printed prescription to replace old machine, continuing AutoPap 5-15

## 2021-03-01 NOTE — Patient Instructions (Signed)
Printed script for use with [employer] or on-line at source like [email or web address] .com:       CPAP machine of choice, autopap 5-15, SD card for downloads, mask of choice, humidifier,  hoses, filters, supplies    dx OSA  Please call if we can help

## 2021-03-01 NOTE — Assessment & Plan Note (Signed)
In discussion with him today I compared hypertension to OSA in terms of potential long-term health consequences if not managed and answered questions.

## 2021-07-02 ENCOUNTER — Encounter: Payer: Self-pay | Admitting: Family Medicine

## 2021-12-06 ENCOUNTER — Encounter: Payer: Self-pay | Admitting: Family Medicine

## 2021-12-06 ENCOUNTER — Ambulatory Visit (INDEPENDENT_AMBULATORY_CARE_PROVIDER_SITE_OTHER): Payer: Self-pay | Admitting: Family Medicine

## 2021-12-06 VITALS — BP 124/62 | HR 87 | Temp 97.8°F | Ht 70.25 in | Wt 181.1 lb

## 2021-12-06 DIAGNOSIS — I10 Essential (primary) hypertension: Secondary | ICD-10-CM

## 2021-12-06 DIAGNOSIS — Z125 Encounter for screening for malignant neoplasm of prostate: Secondary | ICD-10-CM

## 2021-12-06 DIAGNOSIS — Z0000 Encounter for general adult medical examination without abnormal findings: Secondary | ICD-10-CM

## 2021-12-06 DIAGNOSIS — Z636 Dependent relative needing care at home: Secondary | ICD-10-CM

## 2021-12-06 DIAGNOSIS — E782 Mixed hyperlipidemia: Secondary | ICD-10-CM

## 2021-12-06 DIAGNOSIS — D473 Essential (hemorrhagic) thrombocythemia: Secondary | ICD-10-CM

## 2021-12-06 LAB — COMPREHENSIVE METABOLIC PANEL
ALT: 15 U/L (ref 0–53)
AST: 20 U/L (ref 0–37)
Albumin: 4.2 g/dL (ref 3.5–5.2)
Alkaline Phosphatase: 70 U/L (ref 39–117)
BUN: 23 mg/dL (ref 6–23)
CO2: 28 mEq/L (ref 19–32)
Calcium: 9.7 mg/dL (ref 8.4–10.5)
Chloride: 100 mEq/L (ref 96–112)
Creatinine, Ser: 1.25 mg/dL (ref 0.40–1.50)
GFR: 60.9 mL/min (ref >60.00)
Glucose, Bld: 77 mg/dL (ref 70–99)
Potassium: 5.3 mEq/L — ABNORMAL HIGH (ref 3.5–5.1)
Sodium: 138 mEq/L (ref 135–145)
Total Bilirubin: 0.9 mg/dL (ref 0.2–1.2)
Total Protein: 6.2 g/dL (ref 6.0–8.3)

## 2021-12-06 LAB — CBC
HCT: 49.5 % (ref 39.0–52.0)
Hemoglobin: 16.1 g/dL (ref 13.0–17.0)
MCHC: 32.5 g/dL (ref 30.0–36.0)
MCV: 95.1 fl (ref 78.0–100.0)
Platelets: 459 10*3/uL — ABNORMAL HIGH (ref 150.0–400.0)
RBC: 5.21 Mil/uL (ref 4.22–5.81)
RDW: 15 % (ref 11.5–15.5)
WBC: 9.9 10*3/uL (ref 4.0–10.5)

## 2021-12-06 LAB — LIPID PANEL
Cholesterol: 115 mg/dL (ref 0–200)
HDL: 42.2 mg/dL (ref >39.00)
LDL Cholesterol: 40 mg/dL (ref 0–99)
NonHDL: 72.69
Total CHOL/HDL Ratio: 3
Triglycerides: 162 mg/dL — ABNORMAL HIGH (ref 0.0–149.0)
VLDL: 32.4 mg/dL (ref 0.0–40.0)

## 2021-12-06 LAB — PSA: PSA: 0.33 ng/mL (ref 0.10–4.00)

## 2021-12-06 MED ORDER — LISINOPRIL 10 MG PO TABS: 10.0000 mg | ORAL_TABLET | Freq: Every day | ORAL | 1 refills | Status: DC

## 2021-12-06 MED ORDER — ATORVASTATIN CALCIUM 10 MG PO TABS: 10.0000 mg | ORAL_TABLET | Freq: Every day | ORAL | 1 refills | Status: DC

## 2021-12-06 NOTE — Assessment & Plan Note (Signed)
Patient's wife with dementia which has progressed significantly over the last 2 years.  He does not have any family or additional support nearby and is with her 24/7.  Advised he consider counseling with Katherina Right due to experience supporting family members of people with dementia.

## 2021-12-06 NOTE — Patient Instructions (Signed)
#  Referral I have placed a referral to a specialist for you. You should receive a phone call from the specialty office. Make sure your voicemail is not full and that if you are able to answer your phone to unknown or new numbers.   It may take up to 2 weeks to hear about the referral. If you do not hear anything in 2 weeks, please call our office and ask to speak with the referral coordinator.  - Therapist who has experience working with family members of patients with dementia  A social worker will also call you

## 2021-12-06 NOTE — Progress Notes (Signed)
Annual Exam   Chief Complaint:  Chief Complaint  Patient presents with   Annual Exam    History of Present Illness:  Dennis Martin is a 66 y.o. presents today for annual examination.     Nutrition/Lifestyle Diet: healthy diet Exercise: very limited   Social History   Tobacco Use  Smoking Status Former   Packs/day: 1.00   Years: 25.00   Total pack years: 25.00   Types: Cigarettes   Quit date: 05/06/2003   Years since quitting: 18.6  Smokeless Tobacco Never  Tobacco Comments   Quit 2005   Social History   Substance and Sexual Activity  Alcohol Use Yes   Alcohol/week: 7.0 standard drinks of alcohol   Types: 7 Cans of beer per week   Comment: 0-1 beers daily   Social History   Substance and Sexual Activity  Drug Use No     Safety The patient wears seatbelts: yes.     The patient feels safe at home and in their relationships: yes.  General Health Dentist in the last year: No Eye doctor: no  Weight Wt Readings from Last 3 Encounters:  12/06/21 181 lb 2 oz (82.2 kg)  03/01/21 182 lb (82.6 kg)  12/04/20 181 lb 8 oz (82.3 kg)   Patient has normal BMI  BMI Readings from Last 1 Encounters:  12/06/21 25.80 kg/m     Chronic disease screening Blood pressure monitoring:  BP Readings from Last 3 Encounters:  12/06/21 124/62  03/01/21 114/70  12/04/20 118/72    Lipid Monitoring: Indication for screening: age >35, obesity, diabetes, family hx, CV risk factors.  Lipid screening: Yes  Lab Results  Component Value Date   CHOL 145 11/28/2020   HDL 41.30 11/28/2020   LDLCALC 87 11/28/2020   TRIG 80.0 11/28/2020   CHOLHDL 4 11/28/2020     Diabetes Screening: age >65, overweight, family hx, PCOS, hx of gestational diabetes, at risk ethnicity, elevated blood pressure >135/80.  Diabetes Screening screening: Yes  No results found for: "HGBA1C"   Prostate Cancer Screening: Yes Age 21-69 yo Shared Decision Making Higher Risk: Older age, African American,  Family Hx of Prostate Cancer - Yes Benefits: screening may prevent 1.3 deaths from prostate cancer over 13 years per 1000 men screened and prevent 3 metastatic cases per 1000 men screened. Not enough evidence to support more benefit for AA or [place] Harms: False Positive and psychological harms. 15% of me with false positive over a 2 to 4 year period > resulting in biopsy and complications such as pain, hematospermia, infections. Overdiagnosis - increases with age - found that 20-50% of prostate cancer through screening may have never caused any issues. Harms of treatment include - erectile dysfunction, urinary incontinence, and bothersome bowel symptoms.   After discussion he does want to get a PSA checked today.   Inadequate evidence for screening <55 No mortality benefit for screening >70   Lab Results  Component Value Date   PSA 0.78 06/30/2018   PSA 0.58 12/23/2017   PSA 0.64 06/24/2017       Colon Cancer Screening:  Age 57-75 yo - benefits outweigh the risk. Adults 35-85 yo who have never been screened benefit.  Benefits: 134000 people in 2016 will be diagnosed and 49,000 will die - early detection helps Harms: Complications 2/2 to colonoscopy High Risk (Colonoscopy): genetic disorder (Lynch syndrome or familial adenomatous polyposis), personal hx of IBD, previous adenomatous polyp, or previous colorectal cancer, FamHx start 10 years before the age at  diagnosis, increased in males and black race  Options:  FIT - looks for hemoglobin (blood in the stool) - specific and fairly sensitive - must be done annually Cologuard - looks for DNA and blood - more sensitive - therefore can have more false positives, every 3 years Colonoscopy - every 10 years if normal - sedation, bowl prep, must have someone drive you  Shared decision making and the patient had decided to do Colonoscopy 2024.   Social History   Tobacco Use  Smoking Status Former   Packs/day: 1.00   Years: 25.00   Total  pack years: 25.00   Types: Cigarettes   Quit date: 05/06/2003   Years since quitting: 18.6  Smokeless Tobacco Never  Tobacco Comments   Quit 2005    Lung Cancer Screening (Ages 7-80): yes 20 year pack history? Yes Current Tobacco user? No Quit less than 15 years ago? No Interested in low dose CT for lung cancer screening? not applicable  Abdominal Aortic Aneurysm:  Age 2-75, 1 time screening, men who have ever smoked discussed    Past Medical History:  Diagnosis Date   Anxiety    hx of - 10 years ago   Arthritis    both arthritis, wrist, hands    BPH (benign prostatic hyperplasia)    DVT of leg (deep venous thrombosis) ([facility]) 2001   post knee arthroscopy,also complicated by infection, PICC line used for long term antibiotics, was on coumadin x's 6 months   Essential thrombocytosis ([place])    Headache    relative to testosterone level, it had been a problem, improved with clomid   History of stress test 2008   done in [place] ,nuclear stress test- told that it was normal   Hypertension    Peripheral vascular disease ([facility])    Sleep apnea    did surgery for the problem   Spleen enlarged    Thrombocythemia    monitored by Dr. Earlie Server    Past Surgical History:  Procedure Laterality Date   EYE SURGERY     chalazion on both eyelids   KNEE SURGERY Bilateral 2001   NASAL SINUS SURGERY     right shoulder surgery     TONSILLECTOMY     TOTAL KNEE ARTHROPLASTY Right 05/22/2015   TOTAL KNEE ARTHROPLASTY Right 05/22/2015   Procedure: TOTAL KNEE ARTHROPLASTY;  Surgeon: Frederik Pear, MD;  Location: [facility];  Service: Orthopedics;  Laterality: Right;   TOTAL KNEE ARTHROPLASTY Left 07/17/2015   TOTAL KNEE ARTHROPLASTY Left 07/17/2015   Procedure: TOTAL KNEE ARTHROPLASTY;  Surgeon: Frederik Pear, MD;  Location: [facility];  Service: Orthopedics;  Laterality: Left;   TRIGGER FINGER RELEASE Left    ULNAR NERVE TRANSPOSITION  2007   left    Prior to Admission medications   Medication  Sig Start Date End Date Taking? Authorizing Provider  amoxicillin (AMOXIL) 500 MG capsule SMARTSIG:4 Capsule(s) By Mouth Once 03/17/19   [provider]  aspirin EC 81 MG tablet Take 81 mg by mouth 2 (two) times daily.    [provider]  atorvastatin (LIPITOR) 10 MG tablet Take 1 tablet (10 mg total) by mouth daily. 12/04/20   Lesleigh Noe, MD  calcium carbonate (TUMS EX) 750 MG chewable tablet Chew 1 tablet by mouth daily as needed for heartburn.    [provider]  Flaxseed, Linseed, (FLAXSEED OIL) 1000 MG CAPS Take 1,000 mg by mouth in the morning. 01/13/20   [provider]  folic acid (FOLVITE)  400 MCG tablet Take 400 mcg by mouth daily.    [provider]  hydroxyurea (HYDREA) 500 MG capsule TAKE 1 CAPSULE DAILY EXCEPT TAKE 2 CAPSULES ON FRIDAYS 12/21/20   Curt Bears, MD  lisinopril (ZESTRIL) 10 MG tablet Take 1 tablet (10 mg total) by mouth daily. 12/04/20   Lesleigh Noe, MD  melatonin 5 MG TABS Take 5 mg by mouth at bedtime. 01/13/20   [provider]  NON FORMULARY CPAP not sure of setting    [provider]  NON FORMULARY Place 10 mg under the tongue in the morning, at noon, and at bedtime. Delta 8 Tincture 10 mg / .3m 01/13/20   [provider]  NYSTATIN powder APPLY TOPICALLY FOUR TIMES DAILY Patient taking differently: Apply 1 application topically as needed. 10/13/17   ALucille Passy MD  OVER THE COUNTER MEDICATION Vitafusion men's complete vitamin gummies; pt chews two gummies daily.    [provider]  tamsulosin (FLOMAX) 0.4 MG CAPS capsule 0.4 mg. 2 tablets a day at bedtime 07/07/18   [provider]  Testosterone Cypionate 200 MG/ML SOLN Inject 1 mL into the muscle every 14 (fourteen) days. 06/13/20   [provider]    Allergies  Allergen Reactions   Keflex [Cephalexin] Rash     Social History   Socioeconomic History   Marital status: Married    Spouse name:  BEconomist  Number of children: 2   Years of education: high school   Highest education level: Not on file  Occupational History   Not on file  Tobacco Use   Smoking status: Former    Packs/day: 1.00    Years: 25.00    Total pack years: 25.00    Types: Cigarettes    Quit date: 05/06/2003    Years since quitting: 18.6   Smokeless tobacco: Never   Tobacco comments:    Quit 2005  Vaping Use   Vaping Use: Never used  Substance and Sexual Activity   Alcohol use: Yes    Alcohol/week: 7.0 standard drinks of alcohol    Types: 7 Cans of beer per week    Comment: 0-1 beers daily   Drug use: No   Sexual activity: Yes    Birth control/protection: Post-menopausal  Other Topics Concern   Not on file  Social History Narrative   05/11/19   [place] Pitcher wife since 2002   Work: data center management work      Family: 2 sons - CHarrell Gaveand KLennette Bihari- no grandchildren       Enjoys: target shooting      Exercise: multiple joint issues has limited   Diet: normal      Safety   Seat belts: Yes    Guns: Yes  and secure   Safe in relationships: Yes    Social Determinants of HRadio broadcast assistantStrain: Not on file  Food Insecurity: Not on file  Transportation Needs: Not on file  Physical Activity: Not on file  Stress: Not on file  Social Connections: Not on file  Intimate Partner Violence: Not on file    Family History  Problem Relation Age of Onset   Heart attack Mother 847  Other Brother        brain aneyurism   Colon cancer Paternal Aunt 457  Stomach cancer Neg Hx     Review of Systems  Constitutional:  Negative for chills and fever.  HENT:  Negative for congestion and sore throat.   Eyes:  Negative for blurred vision and double vision.  Respiratory:  Negative for shortness of breath.   Cardiovascular:  Negative for chest pain.  Gastrointestinal:  Negative for heartburn, nausea and vomiting.   Genitourinary: Negative.   Musculoskeletal: Negative.  Negative for myalgias.  Skin:  Negative for rash.  Neurological:  Negative for dizziness and headaches.  Endo/Heme/Allergies:  Does not bruise/bleed easily.  Psychiatric/Behavioral:  Negative for depression. The patient is not nervous/anxious.      Physical Exam BP 124/62   Pulse 87   Temp 97.8 F (36.6 C) (Temporal)   Ht 5' 10.25" (1.784 m)   Wt 181 lb 2 oz (82.2 kg)   SpO2 97%   BMI 25.80 kg/m    BP Readings from Last 3 Encounters:  12/06/21 124/62  03/01/21 114/70  12/04/20 118/72      Physical Exam Constitutional:      General: He is not in acute distress.    Appearance: He is well-developed. He is not diaphoretic.  HENT:     Head: Normocephalic and atraumatic.     Right Ear: Tympanic membrane and ear canal normal.     Left Ear: Tympanic membrane and ear canal normal.     Nose: Nose normal.     Mouth/Throat:     Pharynx: Uvula midline.  Eyes:     General: No scleral icterus.    Conjunctiva/sclera: Conjunctivae normal.     Pupils: Pupils are equal, round, and reactive to light.  Cardiovascular:     Rate and Rhythm: Normal rate and regular rhythm.     Heart sounds: Normal heart sounds. No murmur heard. Pulmonary:     Effort: Pulmonary effort is normal. No respiratory distress.     Breath sounds: Normal breath sounds. No wheezing.  Abdominal:     General: Bowel sounds are normal. There is no distension.     Palpations: Abdomen is soft. There is no mass.     Tenderness: There is no abdominal tenderness. There is no guarding.  Musculoskeletal:        General: Normal range of motion.     Cervical back: Normal range of motion and neck supple.  Lymphadenopathy:     Cervical: No cervical adenopathy.  Skin:    General: Skin is warm and dry.     Capillary Refill: Capillary refill takes less than 2 seconds.  Neurological:     Mental Status: He is alert and oriented to person, place, and time.         Results:  PHQ-9:  [place] Office Visit from 12/06/2021 in [facility] at [facility]  PHQ-9 Total Score 3         Assessment: 66 y.o. here for routine annual physical examination.  Plan: Problem List Items Addressed This Visit       Cardiovascular and Mediastinum   HTN (hypertension)   Relevant Medications   lisinopril (ZESTRIL) 10 MG tablet   atorvastatin (LIPITOR) 10 MG tablet     Hematopoietic and Hemostatic   Essential thrombocytosis (HCC)   Relevant Orders   CBC     Other   Hyperlipidemia   Relevant Medications   lisinopril (ZESTRIL) 10 MG tablet   atorvastatin (LIPITOR) 10 MG tablet   Other Relevant Orders   Comprehensive metabolic panel   Lipid panel   Caregiver burden    Patient's wife with dementia which has progressed significantly  over the last 2 years.  He does not have any family or additional support nearby and is with her 24/7.  Advised he consider counseling with Katherina Right due to experience supporting family members of people with dementia.       Relevant Orders   Ambulatory referral to Psychology   Other Visit Diagnoses     Annual physical exam    -  Primary   Essential hypertension       Relevant Medications   lisinopril (ZESTRIL) 10 MG tablet   atorvastatin (LIPITOR) 10 MG tablet   Prostate cancer screening       Relevant Orders   PSA       Screening: -- Blood pressure screen normal -- cholesterol screening: will obtain -- Weight screening: overweight: continue to monitor -- Diabetes Screening: will obtain -- Nutrition: normal - Encouraged healthy diet  The 10-year ASCVD risk score (Arnett DK, et al., 2019) is: 11.5%   Values used to calculate the score:     Age: 36 years     Sex: Male     Is Non-Hispanic African American: No     Diabetic: No     Tobacco smoker: No     Systolic Blood Pressure: 970 mmHg     Is BP treated: Yes     HDL Cholesterol: 41.3 mg/dL     Total Cholesterol: 145 mg/dL  --  ASA 81 mg discussed if CVD risk >10% age 52-59 and willing to take for 10 years -- Statin therapy for Age 61-75 with CVD risk >7.5%  Psych -- Depression screening (PHQ-9): negative  Safety -- tobacco screening: not using -- alcohol screening:  low-risk usage. -- no evidence of domestic violence or intimate partner violence.  Cancer Screening -- Prostate (age 37-69) ordered -- Colon (age 62-75)  due in March -- Lung not indicated   Immunizations Immunization History  Administered Date(s) Administered   Influenza Split 12/25/2011   Influenza,inj,Quad PF,6+ Mos 12/16/2012, 12/29/2013, 12/09/2014, 12/06/2015, 12/12/2016, 12/13/2017, 11/26/2018, 11/04/2020, 11/02/2021   Influenza-Unspecified 12/13/2017   Moderna Sars-Covid-2 Vaccination 02/08/2021   PFIZER(Purple Top)SARS-COV-2 Vaccination 07/24/2019, 08/14/2019, 11/08/2019, 10/20/2020   Tdap 04/07/2013   Zoster Recombinat (Shingrix) 05/21/2017, 07/15/2017   Zoster, Live 02/09/2013    -- flu vaccine up to date -- TDAP q10 years up to date -- Shingles (age >50) up to date -- Covid-19 Vaccine up to date  Encouraged regular vision and dental screening. Encouraged healthy exercise and diet.   Lesleigh Noe

## 2021-12-07 ENCOUNTER — Other Ambulatory Visit: Payer: Self-pay | Admitting: Family Medicine

## 2021-12-07 DIAGNOSIS — E875 Hyperkalemia: Secondary | ICD-10-CM

## 2021-12-10 ENCOUNTER — Encounter: Payer: Self-pay | Admitting: Family Medicine

## 2021-12-13 NOTE — Telephone Encounter (Signed)
Letter signed

## 2021-12-26 ENCOUNTER — Other Ambulatory Visit: Payer: Self-pay | Admitting: Family Medicine

## 2022-03-06 NOTE — Progress Notes (Signed)
HPI M former smoker followed for OSA, complicated by HBP Hx UPPP Unattended home sleep test 08/27/15- AHI 26.6/hour, desaturation to 78%, body weight 177 pounds ---------------------------------------------------------------------------------------------   03/01/21- 66 year old male former smoker(25 pk yrs), followed for OSA. Original diagnosis in the 1990s, treated with UPPP, complicated by BPH, DVT, thrombocytosis/splenomegaly/, secondary Polycythemia, phlebotomy. HTN, PVD,  CPAP auto 5-15/ [employer] Download- compliance 93%, AHI 1.3/ hr Body weight today 182 lbs Covid vax-4 Phizer, 1 Moderna Flu vax had -----Patient is here for 1 year follow up on OSA.  He has no insurance.  Current machine is about 64 years old and he asked about replacing it.  Current machine is working very well mechanically.  Download reviewed with him.  I explained how he can use a print prescription with a DME company or online.  03/07/22- 66 year old male former smoker(25 pk yrs), followed for OSA. Original diagnosis in the 1990s, treated with UPPP, complicated by BPH, DVT, thrombocytosis/splenomegaly/, secondary Polycythemia, Phlebotomy. HTN, PVD,  CPAP auto 5-15/ [employer] Download- compliance 43%, AHI 2.1/ hr Body weight today 174 lbs Covid vax-4 Phizer, 1 Moderna                       Wife here Flu vax had He anticipates getting Medicare next year and has put off replacing old CPAP machine until then.  He asks that we rewrite a printed CPAP prescription for him.  Otherwise he reports doing well.  Has been in the process of moving and points to that as his explanation for why CPAP use has been less consistent recently.  Download reviewed.  He denies breathing problems or other active medical concerns.  ROS-see HPI   + = positive Constitutional:    weight loss, night sweats, fevers, chills,+ fatigue, lassitude. HEENT:    headaches, difficulty swallowing, tooth/dental problems, sore throat,       sneezing, itching, ear  ache, nasal congestion, post nasal drip, snoring CV:    chest pain, orthopnea, PND, swelling in lower extremities, anasarca,                                                  dizziness, palpitations Resp:   shortness of breath with exertion or at rest.                productive cough,   non-productive cough, coughing up of blood.              change in color of mucus.  wheezing.   Skin:    rash or lesions. GI:  No-   heartburn, indigestion, abdominal pain, nausea, vomiting, diarrhea,                 change in bowel habits, loss of appetite GU: dysuria, change in color of urine, no urgency or frequency.   flank pain. MS:   +joint pain, stiffness, decreased range of motion, back pain. Neuro-     nothing unusual Psych:  change in mood or affect.  depression or anxiety.   memory loss.  OBJ- Physical Exam General- Alert, Oriented, Affect-appropriate, Distress- none acute, +lean/ fit appearing Skin- rash-none, lesions- none, excoriation- none Lymphadenopathy- none Head- atraumatic            Eyes- Gross vision intact, PERRLA, conjunctivae and secretions clear  Ears- Hearing, canals-normal            Nose- Clear, no-Septal dev, mucus, polyps, erosion, perforation             Throat- Mallampati II/ UPPP , mucosa clear- not dry , drainage- none, tonsils- atrophic Neck- flexible , trachea midline, no stridor , thyroid nl, carotid no bruit Chest - symmetrical excursion , unlabored           Heart/CV- RRR , no murmur , no gallop  , no rub, nl s1 s2                           - JVD- none , edema- none, stasis changes- none, varices- none           Lung- clear to P&A, wheeze- none, cough- none , dullness-none, rub- none           Chest wall-  Abd-  Br/ Gen/ Rectal- Not done, not indicated Extrem- cyanosis- none, clubbing, none, atrophy- none, strength- nl Neuro- grossly intact to observation

## 2022-03-07 ENCOUNTER — Encounter: Payer: Self-pay | Admitting: Internal Medicine

## 2022-03-07 ENCOUNTER — Ambulatory Visit (INDEPENDENT_AMBULATORY_CARE_PROVIDER_SITE_OTHER): Payer: Self-pay | Admitting: Internal Medicine

## 2022-03-07 VITALS — BP 110/64 | HR 89 | Ht 70.5 in | Wt 174.6 lb

## 2022-03-07 DIAGNOSIS — G4733 Obstructive sleep apnea (adult) (pediatric): Secondary | ICD-10-CM

## 2022-03-07 DIAGNOSIS — I10 Essential (primary) hypertension: Secondary | ICD-10-CM

## 2022-03-07 NOTE — Patient Instructions (Signed)
Order- Printed script for CPAP machine of choice, auto 5-15, mask of choice, humidifier, supplies, AirView/ Card

## 2022-03-07 NOTE — Assessment & Plan Note (Signed)
Benefits from CPAP when used.  Use has been interrupted as he is in the moving process now.  We discussed goals.  He expects to get Medicare next summer and become eligible for associated healthcare benefits at that time. Plan-prescription for replacement for old CPAP machine auto 5-15.

## 2022-03-07 NOTE — Assessment & Plan Note (Signed)
He is encouraged to stay in touch with a primary care provider for management as needed.

## 2022-03-14 ENCOUNTER — Encounter: Payer: Self-pay | Admitting: Internal Medicine

## 2022-04-17 ENCOUNTER — Other Ambulatory Visit: Payer: Self-pay

## 2022-04-17 DIAGNOSIS — D473 Essential (hemorrhagic) thrombocythemia: Secondary | ICD-10-CM

## 2022-04-25 ENCOUNTER — Telehealth: Payer: Self-pay | Admitting: Medical Oncology

## 2022-04-25 NOTE — Telephone Encounter (Signed)
Hydroxyurea runs out in April 6th. Requests lab and appt before.

## 2022-05-20 ENCOUNTER — Telehealth: Payer: Self-pay | Admitting: Internal Medicine

## 2022-05-20 NOTE — Telephone Encounter (Signed)
Scheduled appt per 2/26 staff msg. Pt had called in requesting an appt. Okay per MD. Pt is aware of appt date/time.

## 2022-06-24 ENCOUNTER — Encounter: Payer: Self-pay | Admitting: Family Medicine

## 2022-06-24 DIAGNOSIS — I10 Essential (primary) hypertension: Secondary | ICD-10-CM

## 2022-06-24 NOTE — Telephone Encounter (Signed)
Last office visit 12/06/2021 for CPE with Dr. Einar Pheasant.  Last refilled 12/06/2021 for #90 with 1 refill.  TOC scheduled with Dr. Diona Browner 08/06/2022.

## 2022-06-25 MED ORDER — ATORVASTATIN CALCIUM 10 MG PO TABS: 10.0000 mg | ORAL_TABLET | Freq: Every day | ORAL | 0 refills | Status: DC

## 2022-06-25 MED ORDER — LISINOPRIL 10 MG PO TABS: 10.0000 mg | ORAL_TABLET | Freq: Every day | ORAL | 0 refills | Status: DC

## 2022-06-26 ENCOUNTER — Inpatient Hospital Stay: Payer: Medicare HMO | Attending: Internal Medicine | Admitting: Internal Medicine

## 2022-06-26 ENCOUNTER — Inpatient Hospital Stay: Payer: Medicare HMO

## 2022-06-26 ENCOUNTER — Encounter: Payer: Self-pay | Admitting: Internal Medicine

## 2022-06-26 VITALS — BP 115/63 | HR 69 | Temp 97.6°F | Resp 14 | Ht 70.5 in | Wt 174.4 lb

## 2022-06-26 DIAGNOSIS — D473 Essential (hemorrhagic) thrombocythemia: Secondary | ICD-10-CM

## 2022-06-26 DIAGNOSIS — Z79899 Other long term (current) drug therapy: Secondary | ICD-10-CM | POA: Insufficient documentation

## 2022-06-26 LAB — CBC WITH DIFFERENTIAL (CANCER CENTER ONLY)
Abs Immature Granulocytes: 0.07 10*3/uL (ref 0.00–0.07)
Basophils Absolute: 0.1 10*3/uL (ref 0.0–0.1)
Basophils Relative: 1 %
Eosinophils Absolute: 0.2 10*3/uL (ref 0.0–0.5)
Eosinophils Relative: 2 %
HCT: 47.2 % (ref 39.0–52.0)
Hemoglobin: 14.9 g/dL (ref 13.0–17.0)
Immature Granulocytes: 1 %
Lymphocytes Relative: 11 %
Lymphs Abs: 1.1 10*3/uL (ref 0.7–4.0)
MCH: 29 pg (ref 26.0–34.0)
MCHC: 31.6 g/dL (ref 30.0–36.0)
MCV: 91.8 fL (ref 80.0–100.0)
Monocytes Absolute: 0.8 10*3/uL (ref 0.1–1.0)
Monocytes Relative: 8 %
Neutro Abs: 7.4 10*3/uL (ref 1.7–7.7)
Neutrophils Relative %: 77 %
Platelet Count: 411 10*3/uL — ABNORMAL HIGH (ref 150–400)
RBC: 5.14 MIL/uL (ref 4.22–5.81)
RDW: 14.4 % (ref 11.5–15.5)
WBC Count: 9.6 10*3/uL (ref 4.0–10.5)
nRBC: 0 % (ref 0.0–0.2)

## 2022-06-26 LAB — CMP (CANCER CENTER ONLY)
ALT: 16 U/L (ref 0–44)
AST: 22 U/L (ref 15–41)
Albumin: 4.4 g/dL (ref 3.5–5.0)
Alkaline Phosphatase: 62 U/L (ref 38–126)
Anion gap: 7 (ref 5–15)
BUN: 24 mg/dL — ABNORMAL HIGH (ref 8–23)
CO2: 27 mmol/L (ref 22–32)
Calcium: 8.8 mg/dL — ABNORMAL LOW (ref 8.9–10.3)
Chloride: 103 mmol/L (ref 98–111)
Creatinine: 1.1 mg/dL (ref 0.61–1.24)
GFR, Estimated: >60 mL/min (ref >60)
Glucose, Bld: 112 mg/dL — ABNORMAL HIGH (ref 70–99)
Potassium: 4.5 mmol/L (ref 3.5–5.1)
Sodium: 137 mmol/L (ref 135–145)
Total Bilirubin: 0.9 mg/dL (ref 0.3–1.2)
Total Protein: 6.4 g/dL — ABNORMAL LOW (ref 6.5–8.1)

## 2022-06-26 NOTE — Progress Notes (Signed)
[place] [phone]   [phone]  OFFICE PROGRESS NOTE  Waunita Schooner, MD [place] [address]  DIAGNOSIS:  1) Essential thrombocythemia diagnosed in 2006  2) androgen-induced polycythemia.  PRIOR THERAPY: None   CURRENT THERAPY:  1) Hydroxyurea 500 mg by mouth daily except Friday he is on 1000 mg 2) phlebotomy on as-needed basis.  INTERVAL HISTORY: Dennis Martin 67 y.o. male with anesthesia clinic today for follow-up visit accompanied by his wife.  The patient is feeling fine today with no concerning complaints.  He was last seen in 2021.  He could not have follow-up visit because he ran out of insurance.  He had Medicare started this months and he came for evaluation and repeat blood work.  He requested refill of hydroxyurea.  He denied having any current chest pain, shortness of breath, cough or hemoptysis.  He has no nausea, vomiting, diarrhea or constipation.  He has no headache or visual changes.  MEDICAL HISTORY: Past Medical History:  Diagnosis Date   Anxiety    hx of - 10 years ago   Arthritis    both arthritis, wrist, hands    BPH (benign prostatic hyperplasia)    DVT of leg (deep venous thrombosis) ([place]) 2001   post knee arthroscopy,also complicated by infection, PICC line used for long term antibiotics, was on coumadin x's 6 months   Essential thrombocytosis (HCC)    Headache    relative to testosterone level, it had been a problem, improved with clomid   History of stress test 2008   done in [place] ,nuclear stress test- told that it was normal   Hypertension    Peripheral vascular disease ([place])    Sleep apnea    did surgery for the problem   Spleen enlarged    Thrombocythemia    monitored by Dr. Earlie Server    ALLERGIES:  is allergic to keflex [cephalexin].  MEDICATIONS:  Current Outpatient Medications  Medication Sig Dispense Refill   aspirin EC 81 MG tablet Take 81 mg by mouth 2  (two) times daily.     atorvastatin (LIPITOR) 10 MG tablet Take 1 tablet (10 mg total) by mouth daily. 90 tablet 0   Cholecalciferol (VITAMIN D3) 50 MCG (2000 UT) TABS Take 50 mcg by mouth daily at 12 noon.     Flaxseed, Linseed, (FLAXSEED OIL) 1000 MG CAPS Take 1,000 mg by mouth in the morning.     hydroxyurea (HYDREA) 500 MG capsule TAKE 1 CAPSULE DAILY EXCEPT TAKE 2 CAPSULES ON FRIDAYS 105 capsule 4   lisinopril (ZESTRIL) 10 MG tablet Take 1 tablet (10 mg total) by mouth daily. 90 tablet 0   NON FORMULARY CPAP not sure of setting     NON FORMULARY Place 10 mg under the tongue in the morning, at noon, and at bedtime. Delta 8 Tincture 10 mg / .92ml     OVER THE COUNTER MEDICATION Vitafusion men's complete vitamin gummies; pt chews two gummies daily.     tamsulosin (FLOMAX) 0.4 MG CAPS capsule 0.4 mg. 2 tablets a day at bedtime     Testosterone Cypionate 200 MG/ML SOLN Inject 0.5 mLs into the muscle every 14 (fourteen) days.     No current facility-administered medications for this visit.    SURGICAL HISTORY:  Past Surgical History:  Procedure Laterality Date   EYE SURGERY     chalazion on both eyelids   KNEE SURGERY Bilateral 2001  NASAL SINUS SURGERY     right shoulder surgery     TONSILLECTOMY     TOTAL KNEE ARTHROPLASTY Right 05/22/2015   TOTAL KNEE ARTHROPLASTY Right 05/22/2015   Procedure: TOTAL KNEE ARTHROPLASTY;  Surgeon: Frederik Pear, MD;  Location: [place];  Service: Orthopedics;  Laterality: Right;   TOTAL KNEE ARTHROPLASTY Left 07/17/2015   TOTAL KNEE ARTHROPLASTY Left 07/17/2015   Procedure: TOTAL KNEE ARTHROPLASTY;  Surgeon: Frederik Pear, MD;  Location: [facility];  Service: Orthopedics;  Laterality: Left;   TRIGGER FINGER RELEASE Left    ULNAR NERVE TRANSPOSITION  2007   left    REVIEW OF SYSTEMS:  A comprehensive review of systems was negative.   PHYSICAL EXAMINATION: General appearance: alert, cooperative, and no distress Head: Normocephalic, without obvious abnormality,  atraumatic Neck: no adenopathy Lymph nodes: Cervical, supraclavicular, and axillary nodes normal. Resp: clear to auscultation bilaterally Back: symmetric, no curvature. ROM normal. No CVA tenderness. Cardio: regular rate and rhythm, S1, S2 normal, no murmur, click, rub or gallop GI: soft, non-tender; bowel sounds normal; no masses,  no organomegaly Extremities: extremities normal, atraumatic, no cyanosis or edema  ECOG PERFORMANCE STATUS: 1 - Symptomatic but completely ambulatory  Blood pressure 115/63, pulse 69, temperature 97.6 F (36.4 C), temperature source Oral, resp. rate 14, height 5' 10.5" (1.791 m), weight 174 lb 6.6 oz (79.1 kg), SpO2 97 %.  LABORATORY DATA: Lab Results  Component Value Date   WBC 9.9 12/06/2021   HGB 16.1 12/06/2021   HCT 49.5 12/06/2021   MCV 95.1 12/06/2021   PLT 459.0 (H) 12/06/2021      Chemistry      Component Value Date/Time   NA 138 12/06/2021 1104   NA 139 11/10/2013 1508   K 5.3 (H) 12/06/2021 1104   K 4.3 11/10/2013 1508   CL 100 12/06/2021 1104   CL 102 05/04/2012 1309   CO2 28 12/06/2021 1104   CO2 28 11/10/2013 1508   BUN 23 12/06/2021 1104   BUN 15.4 11/10/2013 1508   CREATININE 1.25 12/06/2021 1104   CREATININE 1.22 07/19/2019 0751   CREATININE 1.1 11/10/2013 1508      Component Value Date/Time   CALCIUM 9.7 12/06/2021 1104   CALCIUM 9.6 11/10/2013 1508   ALKPHOS 70 12/06/2021 1104   ALKPHOS 78 11/10/2013 1508   AST 20 12/06/2021 1104   AST 17 07/19/2019 0751   AST 19 11/10/2013 1508   ALT 15 12/06/2021 1104   ALT 19 07/19/2019 0751   ALT 15 11/10/2013 1508   BILITOT 0.9 12/06/2021 1104   BILITOT 0.7 07/19/2019 0751   BILITOT 0.43 11/10/2013 1508       RADIOGRAPHIC STUDIES: No results found.  ASSESSMENT AND PLAN: This is a very pleasant 67 years old white male with essential thrombocythemia currently on treatment with hydroxyurea 500 mg by mouth daily except Friday where the patient takes 1000 mg. The patient  also has reactive polycythemia secondary to androgen treatment and he is currently on phlebotomy on as-needed basis.   The patient is doing fine today with no concerning complaints. I will repeat his blood work today and if needed will consider him for phlebotomy if needed. I will send him refill of hydroxyurea for the history of essential thrombocythemia.  I will see him back for follow-up visit in 6 months for evaluation and repeat blood work. He was advised to call immediately if he has any concerning symptoms in the interval. The patient voices understanding of current disease status and  treatment options and is in agreement with the current care plan.  All questions were answered. The patient knows to call the clinic with any problems, questions or concerns. We can certainly see the patient much sooner if necessary.   Disclaimer: This note was dictated with voice recognition software. Similar sounding words can inadvertently be transcribed and may be missed upon review.

## 2022-06-28 ENCOUNTER — Other Ambulatory Visit: Payer: Self-pay

## 2022-06-28 ENCOUNTER — Encounter: Payer: Self-pay | Admitting: Internal Medicine

## 2022-06-28 DIAGNOSIS — D473 Essential (hemorrhagic) thrombocythemia: Secondary | ICD-10-CM

## 2022-06-28 MED ORDER — HYDROXYUREA 500 MG PO CAPS
ORAL_CAPSULE | ORAL | 4 refills | Status: AC
Start: 2022-06-28 — End: ?

## 2022-07-11 DIAGNOSIS — E291 Testicular hypofunction: Secondary | ICD-10-CM | POA: Diagnosis not present

## 2022-07-18 DIAGNOSIS — E291 Testicular hypofunction: Secondary | ICD-10-CM | POA: Diagnosis not present

## 2022-07-18 DIAGNOSIS — R350 Frequency of micturition: Secondary | ICD-10-CM | POA: Diagnosis not present

## 2022-08-06 ENCOUNTER — Encounter: Payer: Self-pay | Admitting: Family Medicine

## 2022-08-06 ENCOUNTER — Ambulatory Visit (INDEPENDENT_AMBULATORY_CARE_PROVIDER_SITE_OTHER): Payer: Medicare HMO | Admitting: Family Medicine

## 2022-08-06 VITALS — BP 100/60 | HR 89 | Temp 97.9°F | Ht 70.25 in | Wt 176.1 lb

## 2022-08-06 DIAGNOSIS — F321 Major depressive disorder, single episode, moderate: Secondary | ICD-10-CM

## 2022-08-06 DIAGNOSIS — R161 Splenomegaly, not elsewhere classified: Secondary | ICD-10-CM | POA: Diagnosis not present

## 2022-08-06 DIAGNOSIS — I10 Essential (primary) hypertension: Secondary | ICD-10-CM

## 2022-08-06 DIAGNOSIS — D751 Secondary polycythemia: Secondary | ICD-10-CM | POA: Diagnosis not present

## 2022-08-06 DIAGNOSIS — R7989 Other specified abnormal findings of blood chemistry: Secondary | ICD-10-CM | POA: Diagnosis not present

## 2022-08-06 DIAGNOSIS — N401 Enlarged prostate with lower urinary tract symptoms: Secondary | ICD-10-CM

## 2022-08-06 DIAGNOSIS — R21 Rash and other nonspecific skin eruption: Secondary | ICD-10-CM

## 2022-08-06 DIAGNOSIS — E782 Mixed hyperlipidemia: Secondary | ICD-10-CM | POA: Diagnosis not present

## 2022-08-06 DIAGNOSIS — D473 Essential (hemorrhagic) thrombocythemia: Secondary | ICD-10-CM

## 2022-08-06 DIAGNOSIS — G4733 Obstructive sleep apnea (adult) (pediatric): Secondary | ICD-10-CM | POA: Diagnosis not present

## 2022-08-06 MED ORDER — LISINOPRIL 10 MG PO TABS: 10.0000 mg | ORAL_TABLET | Freq: Every day | ORAL | 3 refills | Status: DC

## 2022-08-06 MED ORDER — ATORVASTATIN CALCIUM 10 MG PO TABS: 10.0000 mg | ORAL_TABLET | Freq: Every day | ORAL | 3 refills | Status: DC

## 2022-08-06 MED ORDER — TRIAMCINOLONE ACETONIDE 0.5 % EX CREA: 1.0000 | TOPICAL_CREAM | Freq: Two times a day (BID) | CUTANEOUS | 0 refills | Status: DC

## 2022-08-06 MED ORDER — FLUOXETINE HCL 10 MG PO CAPS
10.0000 mg | ORAL_CAPSULE | Freq: Every day | ORAL | 3 refills | Status: DC
Start: 2022-08-06 — End: 2022-09-05

## 2022-08-06 NOTE — Assessment & Plan Note (Signed)
Chronic Followed by urology.  On flomax.

## 2022-08-06 NOTE — Assessment & Plan Note (Signed)
Due to the thrombocytosis

## 2022-08-06 NOTE — Assessment & Plan Note (Signed)
Chronic, diagnosed in 2006 Followed by hematology Dr. Mohammed CURRENT THERAPY:  1) Hydroxyurea 500 mg by mouth daily except Friday he is on 1000 mg 2) phlebotomy on as-needed basis. 

## 2022-08-06 NOTE — Assessment & Plan Note (Deleted)
Chronic, diagnosed in 2006 Followed by hematology Dr. Shirline Frees CURRENT THERAPY:  1) Hydroxyurea 500 mg by mouth daily except Friday he is on 1000 mg 2) phlebotomy on as-needed basis.

## 2022-08-06 NOTE — Assessment & Plan Note (Signed)
Chronic, secondary to testosterone supplementation.

## 2022-08-06 NOTE — Assessment & Plan Note (Signed)
Stable, chronic.  Continue current medication.  Lisinopril 10 mg p.o. daily 

## 2022-08-06 NOTE — Progress Notes (Signed)
Patient ID: Dennis Martin, male    DOB: 1955/11/28, 67 y.o.   MRN: [id number]  This visit was conducted in person.  BP 100/60 (BP Location: Left Arm, Patient Position: Sitting, Cuff Size: Normal)   Pulse 89   Temp 97.9 F (36.6 C) (Temporal)   Ht 5' 10.25" (1.784 m)   Wt 176 lb 2 oz (79.9 kg)   SpO2 96%   BMI 25.09 kg/m    CC:  Chief Complaint  Patient presents with   Establish Care    TOC from Dr. Selena Batten    Subjective:   HPI: Dennis Martin is a 67 y.o. male presenting on 08/06/2022 for Establish Care (TOC from Dr. Selena Batten)  Previous MD Selena Batten Last physical 12/06/2021  He reports he is having issues with depression, caregiver for wife.  Ongoing last year.  Try to do deep breathing and walks.     08/06/2022   10:31 AM 12/06/2021   11:27 AM 12/05/2020    8:59 AM  Depression screen PHQ 2/9  Decreased Interest 3 1 1   Down, Depressed, Hopeless 3 1 1   PHQ - 2 Score 6 2 2   Altered sleeping 3 0 1  Tired, decreased energy 2 1 2   Change in appetite 0 0 0  Feeling bad or failure about yourself  1 0 0  Trouble concentrating 0 0 1  Moving slowly or fidgety/restless 0 0 0  Suicidal thoughts 1 0 0  PHQ-9 Score 13 3 6   Difficult doing work/chores Somewhat difficult Not difficult at all Somewhat difficult       08/06/2022   10:31 AM  GAD 7 : Generalized Anxiety Score  Nervous, Anxious, on Edge 0  Control/stop worrying 0  Worry too much - different things 1  Trouble relaxing 2  Restless 0  Easily annoyed or irritable 1  Afraid - awful might happen 0  Total GAD 7 Score 4  Anxiety Difficulty Somewhat difficult     Also new rash on scalp x 10 monht. No new exposures.  Not itchy, occ clear discharge. Noted after shaving given it irritated scalp.  Using a dandruff  shampoo.    Essential thrombocytopenia diagnosed in 2006, androgen induced polycythemia: Followed by Dr. Shirline Frees.  Reviewed last office visit June 26, 2022  Low testosterone: On testosterone supplementation..  Followed  by urology.  Last check 07/11/2022  testosterone 674.8  Hypertension:  Well-controlled on lisinopril 10 mg p.o. daily BP Readings from Last 3 Encounters:  08/06/22 100/60  06/26/22 115/63  03/07/22 110/64  Using medication without problems or lightheadedness:  no SE Chest pain with exertion: none Edema: none Short of breath: none Average home BPs: Other issues:  Elevated Cholesterol: LDL at goal at last check on atorvastatin 10 mg daily. Lab Results  Component Value Date   CHOL 115 12/06/2021   HDL 42.20 12/06/2021   LDLCALC 40 12/06/2021   TRIG 162.0 (H) 12/06/2021   CHOLHDL 3 12/06/2021    Sleep apnea followed by pulmonary on CPAP.  Relevant past medical, surgical, family and social history reviewed and updated as indicated. Interim medical history since our last visit reviewed. Allergies and medications reviewed and updated. Outpatient Medications Prior to Visit  Medication Sig Dispense Refill   aspirin EC 81 MG tablet Take 81 mg by mouth 2 (two) times daily.     Cholecalciferol (VITAMIN D3) 50 MCG (2000 UT) TABS Take 50 mcg by mouth daily at 12 noon.     hydroxyurea (HYDREA) 500 MG capsule  TAKE 1 CAPSULE DAILY EXCEPT TAKE 2 CAPSULES ON FRIDAYS 105 capsule 4   Multiple Vitamins-Minerals (ONE-A-DAY MENS 50+) TABS Take 1 tablet by mouth daily.     NON FORMULARY CPAP not sure of setting     NON FORMULARY Place 10 mg under the tongue in the morning, at noon, and at bedtime. Delta 8 Tincture 10 mg / .33ml     OVER THE COUNTER MEDICATION Vitafusion men's complete vitamin gummies; pt chews two gummies daily.     tamsulosin (FLOMAX) 0.4 MG CAPS capsule 0.4 mg. 2 tablets a day at bedtime     Testosterone Cypionate 200 MG/ML SOLN Inject 0.5 mLs into the muscle every 14 (fourteen) days.     atorvastatin (LIPITOR) 10 MG tablet Take 1 tablet (10 mg total) by mouth daily. 90 tablet 0   lisinopril (ZESTRIL) 10 MG tablet Take 1 tablet (10 mg total) by mouth daily. 90 tablet 0   Flaxseed,  Linseed, (FLAXSEED OIL) 1000 MG CAPS Take 1,000 mg by mouth in the morning.     No facility-administered medications prior to visit.     Per HPI unless specifically indicated in ROS section below Review of Systems  Constitutional:  Negative for fatigue and fever.  HENT:  Negative for ear pain.   Eyes:  Negative for pain.  Respiratory:  Negative for cough and shortness of breath.   Cardiovascular:  Negative for chest pain, palpitations and leg swelling.  Gastrointestinal:  Negative for abdominal pain.  Genitourinary:  Negative for dysuria.  Musculoskeletal:  Negative for arthralgias.  Neurological:  Negative for syncope, light-headedness and headaches.  Psychiatric/Behavioral:  Negative for dysphoric mood.    Objective:  BP 100/60 (BP Location: Left Arm, Patient Position: Sitting, Cuff Size: Normal)   Pulse 89   Temp 97.9 F (36.6 C) (Temporal)   Ht 5' 10.25" (1.784 m)   Wt 176 lb 2 oz (79.9 kg)   SpO2 96%   BMI 25.09 kg/m   Wt Readings from Last 3 Encounters:  08/06/22 176 lb 2 oz (79.9 kg)  06/26/22 174 lb 6.6 oz (79.1 kg)  03/07/22 174 lb 9.6 oz (79.2 kg)      Physical Exam Constitutional:      General: He is not in acute distress.    Appearance: Normal appearance. He is well-developed. He is not ill-appearing or toxic-appearing.  HENT:     Head: Normocephalic and atraumatic.     Right Ear: Hearing, tympanic membrane, ear canal and external ear normal.     Left Ear: Hearing, tympanic membrane, ear canal and external ear normal.     Nose: Nose normal.     Mouth/Throat:     Pharynx: Uvula midline.  Eyes:     General: Lids are normal. Lids are everted, no foreign bodies appreciated.     Conjunctiva/sclera: Conjunctivae normal.     Pupils: Pupils are equal, round, and reactive to light.  Neck:     Thyroid: No thyroid mass or thyromegaly.     Vascular: No carotid bruit.     Trachea: Trachea and phonation normal.  Cardiovascular:     Rate and Rhythm: Normal rate and  regular rhythm.     Pulses: Normal pulses.     Heart sounds: S1 normal and S2 normal. No murmur heard.    No gallop.  Pulmonary:     Breath sounds: Normal breath sounds. No wheezing, rhonchi or rales.  Abdominal:     General: Bowel sounds are normal.  Palpations: Abdomen is soft.     Tenderness: There is no abdominal tenderness. There is no guarding or rebound.     Hernia: No hernia is present.  Musculoskeletal:     Cervical back: Normal range of motion and neck supple.  Lymphadenopathy:     Cervical: No cervical adenopathy.  Skin:    General: Skin is warm and dry.     Findings: No rash.  Neurological:     Mental Status: He is alert.     Cranial Nerves: No cranial nerve deficit.     Sensory: No sensory deficit.     Gait: Gait normal.     Deep Tendon Reflexes: Reflexes are normal and symmetric.  Psychiatric:        Speech: Speech normal.        Behavior: Behavior normal.        Judgment: Judgment normal.       Results for orders placed or performed in visit on 06/26/22  CMP (Cancer Center only)  Result Value Ref Range   Sodium 137 135 - 145 mmol/L   Potassium 4.5 3.5 - 5.1 mmol/L   Chloride 103 98 - 111 mmol/L   CO2 27 22 - 32 mmol/L   Glucose, Bld 112 (H) 70 - 99 mg/dL   BUN 24 (H) 8 - 23 mg/dL   Creatinine 1.61 0.96 - 1.24 mg/dL   Calcium 8.8 (L) 8.9 - 10.3 mg/dL   Total Protein 6.4 (L) 6.5 - 8.1 g/dL   Albumin 4.4 3.5 - 5.0 g/dL   AST 22 15 - 41 U/L   ALT 16 0 - 44 U/L   Alkaline Phosphatase 62 38 - 126 U/L   Total Bilirubin 0.9 0.3 - 1.2 mg/dL   GFR, Estimated >04 >54 mL/min   Anion gap 7 5 - 15  CBC with Differential (Cancer Center Only)  Result Value Ref Range   WBC Count 9.6 4.0 - 10.5 K/uL   RBC 5.14 4.22 - 5.81 MIL/uL   Hemoglobin 14.9 13.0 - 17.0 g/dL   HCT 09.8 11.9 - 14.7 %   MCV 91.8 80.0 - 100.0 fL   MCH 29.0 26.0 - 34.0 pg   MCHC 31.6 30.0 - 36.0 g/dL   RDW 82.9 56.2 - 13.0 %   Platelet Count 411 (H) 150 - 400 K/uL   nRBC 0.0 0.0 - 0.2 %    Neutrophils Relative % 77 %   Neutro Abs 7.4 1.7 - 7.7 K/uL   Lymphocytes Relative 11 %   Lymphs Abs 1.1 0.7 - 4.0 K/uL   Monocytes Relative 8 %   Monocytes Absolute 0.8 0.1 - 1.0 K/uL   Eosinophils Relative 2 %   Eosinophils Absolute 0.2 0.0 - 0.5 K/uL   Basophils Relative 1 %   Basophils Absolute 0.1 0.0 - 0.1 K/uL   Immature Granulocytes 1 %   Abs Immature Granulocytes 0.07 0.00 - 0.07 K/uL    Assessment and Plan  Polycythemia, secondary Assessment & Plan: Chronic, secondary to testosterone supplementation.   Essential hypertension -     Lisinopril; Take 1 tablet (10 mg total) by mouth daily.  Dispense: 90 tablet; Refill: 3  Low testosterone Assessment & Plan: Chronic, followed by urology on testosterone supplementation.   Essential thrombocytosis (HCC) Assessment & Plan: Chronic, diagnosed in 2006 Followed by hematology Dr. Shirline Frees CURRENT THERAPY:  1) Hydroxyurea 500 mg by mouth daily except Friday he is on 1000 mg 2) phlebotomy on as-needed basis.   Mixed hyperlipidemia Assessment &  Plan: Chronic, well-controlled at last check on atorvastatin 10 mg p.o. daily   OSA (obstructive sleep apnea) Assessment & Plan: On CPAP followed by pulmonary   Primary hypertension Assessment & Plan: Stable, chronic.  Continue current medication.  Lisinopril 10 mg p.o. daily   Benign prostatic hyperplasia with lower urinary tract symptoms, symptom details unspecified Assessment & Plan:  Chronic Followed by urology.  On flomax.   Current moderate episode of major depressive disorder without prior episode [facility]) Assessment & Plan:  ACute  Start fluoxetine low dose, offered counsleor he will consider.  Follow up for re-eval in 4 weeks.   Splenomegaly Assessment & Plan:  Due to the thrombocytosis   Rash Assessment & Plan:  Acute possible past sun damage... concern  for irritant dermatitis... will try trial of topical steroid.  If not improving .Marland Kitchen Refer to  dermatology.   Other orders -     Atorvastatin Calcium; Take 1 tablet (10 mg total) by mouth daily.  Dispense: 90 tablet; Refill: 3 -     FLUoxetine HCl; Take 1 capsule (10 mg total) by mouth daily.  Dispense: 30 capsule; Refill: 3 -     Triamcinolone Acetonide; Apply 1 Application topically 2 (two) times daily.  Dispense: 30 g; Refill: 0    Return in about 4 weeks (around 09/03/2022) for  follow up mood,  in Septemeber make a [facility] visit with fasting labs prior.   Kerby Nora, MD

## 2022-08-06 NOTE — Assessment & Plan Note (Signed)
Chronic, followed by urology on testosterone supplementation.

## 2022-08-06 NOTE — Assessment & Plan Note (Signed)
Chronic, well-controlled at last check on atorvastatin 10 mg p.o. daily

## 2022-08-06 NOTE — Assessment & Plan Note (Signed)
ACute  Start fluoxetine low dose, offered counsleor he will consider.  Follow up for re-eval in 4 weeks.

## 2022-08-06 NOTE — Assessment & Plan Note (Signed)
On CPAP followed by pulmonary

## 2022-08-06 NOTE — Assessment & Plan Note (Signed)
Acute possible past sun damage... concern  for irritant dermatitis... will try trial of topical steroid.  If not improving .Marland Kitchen Refer to dermatology.

## 2022-08-23 ENCOUNTER — Encounter: Payer: Self-pay | Admitting: Internal Medicine

## 2022-09-05 ENCOUNTER — Other Ambulatory Visit: Payer: Self-pay | Admitting: Family Medicine

## 2022-09-05 MED ORDER — FLUOXETINE HCL 20 MG PO CAPS: 20.0000 mg | ORAL_CAPSULE | Freq: Every day | ORAL | 5 refills | Status: DC

## 2022-09-05 NOTE — Progress Notes (Signed)
Pt has appt tommorow.. requested increase in dose and refills

## 2022-09-06 ENCOUNTER — Ambulatory Visit (INDEPENDENT_AMBULATORY_CARE_PROVIDER_SITE_OTHER): Payer: Medicare HMO | Admitting: Family Medicine

## 2022-09-06 ENCOUNTER — Encounter: Payer: Self-pay | Admitting: Family Medicine

## 2022-09-06 VITALS — BP 106/50 | HR 79 | Temp 98.2°F | Ht 70.25 in | Wt 175.4 lb

## 2022-09-06 DIAGNOSIS — F321 Major depressive disorder, single episode, moderate: Secondary | ICD-10-CM

## 2022-09-06 DIAGNOSIS — Z636 Dependent relative needing care at home: Secondary | ICD-10-CM | POA: Diagnosis not present

## 2022-09-06 NOTE — Progress Notes (Unsigned)
Patient ID: Dennis Martin, male    DOB: Mar 11, 1956, 67 y.o.   MRN: [id number]  This visit was conducted in person.  BP (!) 106/50   Pulse 79   Temp 98.2 F (36.8 C) (Oral)   Ht 5' 10.25" (1.784 m)   Wt 175 lb 6.4 oz (79.6 kg)   SpO2 96%   BMI 24.99 kg/m    CC:  Chief Complaint  Patient presents with   Medical Management of Chronic Issues    Follow up on mood    Subjective:   HPI: Dennis Martin is a 67 y.o. male presenting on 09/06/2022 for Medical Management of Chronic Issues (Follow up on mood)     MDD, inadequate control:  He has noted some improvement (25%) in his mood but continues to have difficulty.  He has requested increase in the dose of  fluoxetine from 10 to 20 mg..  This medication was sent in yesterday as requested.  SE of sweatiness.  If it looks like it looks like the depression questionnaire was a  He continues to struggle with caring for his wife who has dementia and mood instability, agitation.      09/06/2022    2:48 PM 08/06/2022   10:31 AM 12/06/2021   11:27 AM  Depression screen PHQ 2/9  Decreased Interest 2 3 1   Down, Depressed, Hopeless 2 3 1   PHQ - 2 Score 4 6 2   Altered sleeping 3 3 0  Tired, decreased energy 1 2 1   Change in appetite 0 0 0  Feeling bad or failure about yourself  1 1 0  Trouble concentrating 0 0 0  Moving slowly or fidgety/restless 0 0 0  Suicidal thoughts 1 1 0  PHQ-9 Score 10 13 3   Difficult doing work/chores Somewhat difficult Somewhat difficult Not difficult at all      09/06/2022    2:49 PM 08/06/2022   10:31 AM  GAD 7 : Generalized Anxiety Score  Nervous, Anxious, on Edge 2 0  Control/stop worrying 1 0  Worry too much - different things 2 1  Trouble relaxing 2 2  Restless 0 0  Easily annoyed or irritable 2 1  Afraid - awful might happen 1 0  Total GAD 7 Score 10 4  Anxiety Difficulty Somewhat difficult Somewhat difficult   He has receive a jury summon again... need excuse letter given unable to leave wife  with dementia alone.  July 8 Juror number [phone]   Relevant past medical, surgical, family and social history reviewed and updated as indicated. Interim medical history since our last visit reviewed. Allergies and medications reviewed and updated. Outpatient Medications Prior to Visit  Medication Sig Dispense Refill   aspirin EC 81 MG tablet Take 81 mg by mouth 2 (two) times daily.     atorvastatin (LIPITOR) 10 MG tablet Take 1 tablet (10 mg total) by mouth daily. 90 tablet 3   Cholecalciferol (VITAMIN D3) 50 MCG (2000 UT) TABS Take 50 mcg by mouth daily at 12 noon.     FLUoxetine (PROZAC) 20 MG capsule Take 1 capsule (20 mg total) by mouth daily. 30 capsule 5   hydroxyurea (HYDREA) 500 MG capsule TAKE 1 CAPSULE DAILY EXCEPT TAKE 2 CAPSULES ON FRIDAYS 105 capsule 4   lisinopril (ZESTRIL) 10 MG tablet Take 1 tablet (10 mg total) by mouth daily. 90 tablet 3   Multiple Vitamins-Minerals (ONE-A-DAY MENS 50+) TABS Take 1 tablet by mouth daily.     NON FORMULARY CPAP  not sure of setting     NON FORMULARY Place 10 mg under the tongue in the morning, at noon, and at bedtime. Delta 8 Tincture 10 mg / .33ml     OVER THE COUNTER MEDICATION Vitafusion men's complete vitamin gummies; pt chews two gummies daily.     tamsulosin (FLOMAX) 0.4 MG CAPS capsule 0.4 mg. 2 tablets a day at bedtime     Testosterone Cypionate 200 MG/ML SOLN Inject 0.5 mLs into the muscle every 14 (fourteen) days.     triamcinolone cream (KENALOG) 0.5 % Apply 1 Application topically 2 (two) times daily. 30 g 0   No facility-administered medications prior to visit.     Per HPI unless specifically indicated in ROS section below Review of Systems Objective:  BP (!) 106/50   Pulse 79   Temp 98.2 F (36.8 C) (Oral)   Ht 5' 10.25" (1.784 m)   Wt 175 lb 6.4 oz (79.6 kg)   SpO2 96%   BMI 24.99 kg/m   Wt Readings from Last 3 Encounters:  09/06/22 175 lb 6.4 oz (79.6 kg)  08/06/22 176 lb 2 oz (79.9 kg)  06/26/22 174 lb 6.6 oz  (79.1 kg)      Physical Exam     MMSE 30/30, animal recall 10  Results for orders placed or performed in visit on 06/26/22  CMP (Cancer Center only)  Result Value Ref Range   Sodium 137 135 - 145 mmol/L   Potassium 4.5 3.5 - 5.1 mmol/L   Chloride 103 98 - 111 mmol/L   CO2 27 22 - 32 mmol/L   Glucose, Bld 112 (H) 70 - 99 mg/dL   BUN 24 (H) 8 - 23 mg/dL   Creatinine 1.61 0.96 - 1.24 mg/dL   Calcium 8.8 (L) 8.9 - 10.3 mg/dL   Total Protein 6.4 (L) 6.5 - 8.1 g/dL   Albumin 4.4 3.5 - 5.0 g/dL   AST 22 15 - 41 U/L   ALT 16 0 - 44 U/L   Alkaline Phosphatase 62 38 - 126 U/L   Total Bilirubin 0.9 0.3 - 1.2 mg/dL   GFR, Estimated >04 >54 mL/min   Anion gap 7 5 - 15  CBC with Differential (Cancer Center Only)  Result Value Ref Range   WBC Count 9.6 4.0 - 10.5 K/uL   RBC 5.14 4.22 - 5.81 MIL/uL   Hemoglobin 14.9 13.0 - 17.0 g/dL   HCT 09.8 11.9 - 14.7 %   MCV 91.8 80.0 - 100.0 fL   MCH 29.0 26.0 - 34.0 pg   MCHC 31.6 30.0 - 36.0 g/dL   RDW 82.9 56.2 - 13.0 %   Platelet Count 411 (H) 150 - 400 K/uL   nRBC 0.0 0.0 - 0.2 %   Neutrophils Relative % 77 %   Neutro Abs 7.4 1.7 - 7.7 K/uL   Lymphocytes Relative 11 %   Lymphs Abs 1.1 0.7 - 4.0 K/uL   Monocytes Relative 8 %   Monocytes Absolute 0.8 0.1 - 1.0 K/uL   Eosinophils Relative 2 %   Eosinophils Absolute 0.2 0.0 - 0.5 K/uL   Basophils Relative 1 %   Basophils Absolute 0.1 0.0 - 0.1 K/uL   Immature Granulocytes 1 %   Abs Immature Granulocytes 0.07 0.00 - 0.07 K/uL    Assessment and Plan  There are no diagnoses linked to this encounter.  No follow-ups on file.   Kerby Nora, MD

## 2022-09-11 NOTE — Assessment & Plan Note (Signed)
Chronic, poorly controlled.  Minimal improvement with initiation of antidepressant.  He has tried increasing the dose and has tolerated it in the last 24 hours.  We will continue this increased dose of fluoxetine 20 mg p.o. daily.  He will update me on the results in 4 weeks.

## 2022-11-07 ENCOUNTER — Encounter (INDEPENDENT_AMBULATORY_CARE_PROVIDER_SITE_OTHER): Payer: Self-pay

## 2022-11-20 ENCOUNTER — Telehealth: Payer: Self-pay | Admitting: *Deleted

## 2022-11-20 DIAGNOSIS — E782 Mixed hyperlipidemia: Secondary | ICD-10-CM

## 2022-11-20 DIAGNOSIS — Z125 Encounter for screening for malignant neoplasm of prostate: Secondary | ICD-10-CM

## 2022-11-20 NOTE — Telephone Encounter (Signed)
-----   Message from Lovena Neighbours sent at 11/19/2022 11:12 AM EDT ----- Regarding: Labs for 9.10.24 Please put physical lab orders in future. Thank you, Denny Peon

## 2022-11-28 ENCOUNTER — Telehealth: Payer: Self-pay | Admitting: Family Medicine

## 2022-11-28 NOTE — Telephone Encounter (Signed)
Patient return phone call and would like  a call back

## 2022-12-03 ENCOUNTER — Other Ambulatory Visit: Payer: Medicare HMO

## 2022-12-04 ENCOUNTER — Other Ambulatory Visit (INDEPENDENT_AMBULATORY_CARE_PROVIDER_SITE_OTHER): Payer: Medicare HMO

## 2022-12-04 DIAGNOSIS — Z125 Encounter for screening for malignant neoplasm of prostate: Secondary | ICD-10-CM | POA: Diagnosis not present

## 2022-12-04 DIAGNOSIS — E782 Mixed hyperlipidemia: Secondary | ICD-10-CM | POA: Diagnosis not present

## 2022-12-04 LAB — LIPID PANEL
Cholesterol: 108 mg/dL (ref 0–200)
HDL: 51.3 mg/dL (ref >39.00)
LDL Cholesterol: 46 mg/dL (ref 0–99)
NonHDL: 56.59
Total CHOL/HDL Ratio: 2
Triglycerides: 55 mg/dL (ref 0.0–149.0)
VLDL: 11 mg/dL (ref 0.0–40.0)

## 2022-12-04 LAB — COMPREHENSIVE METABOLIC PANEL
ALT: 35 U/L (ref 0–53)
AST: 38 U/L — ABNORMAL HIGH (ref 0–37)
Albumin: 3.9 g/dL (ref 3.5–5.2)
Alkaline Phosphatase: 68 U/L (ref 39–117)
BUN: 18 mg/dL (ref 6–23)
CO2: 29 meq/L (ref 19–32)
Calcium: 8.9 mg/dL (ref 8.4–10.5)
Chloride: 103 meq/L (ref 96–112)
Creatinine, Ser: 1.01 mg/dL (ref 0.40–1.50)
GFR: 78.1 mL/min (ref >60.00)
Glucose, Bld: 75 mg/dL (ref 70–99)
Potassium: 4.1 meq/L (ref 3.5–5.1)
Sodium: 138 meq/L (ref 135–145)
Total Bilirubin: 0.8 mg/dL (ref 0.2–1.2)
Total Protein: 5.6 g/dL — ABNORMAL LOW (ref 6.0–8.3)

## 2022-12-04 LAB — PSA, MEDICARE: PSA: 0.83 ng/mL (ref 0.10–4.00)

## 2022-12-10 ENCOUNTER — Encounter: Payer: Self-pay | Admitting: Family Medicine

## 2022-12-10 ENCOUNTER — Ambulatory Visit: Payer: Medicare HMO | Admitting: Family Medicine

## 2022-12-10 VITALS — BP 120/60 | HR 78 | Temp 98.1°F | Ht 70.5 in | Wt 168.1 lb

## 2022-12-10 DIAGNOSIS — E782 Mixed hyperlipidemia: Secondary | ICD-10-CM

## 2022-12-10 DIAGNOSIS — R21 Rash and other nonspecific skin eruption: Secondary | ICD-10-CM

## 2022-12-10 DIAGNOSIS — I10 Essential (primary) hypertension: Secondary | ICD-10-CM

## 2022-12-10 DIAGNOSIS — D473 Essential (hemorrhagic) thrombocythemia: Secondary | ICD-10-CM | POA: Diagnosis not present

## 2022-12-10 DIAGNOSIS — G4733 Obstructive sleep apnea (adult) (pediatric): Secondary | ICD-10-CM | POA: Diagnosis not present

## 2022-12-10 DIAGNOSIS — Z0000 Encounter for general adult medical examination without abnormal findings: Secondary | ICD-10-CM | POA: Diagnosis not present

## 2022-12-10 DIAGNOSIS — I83813 Varicose veins of bilateral lower extremities with pain: Secondary | ICD-10-CM

## 2022-12-10 NOTE — Patient Instructions (Addendum)
Can add allegra for allergies as needed in place of Zyrtec.  Can use hydrocortisone on itchy area on legs.  Refer to Dermatology for scalp changes and vascular for  varicose veins.

## 2022-12-10 NOTE — Assessment & Plan Note (Signed)
Scalp changes .Marland Kitchen See picture from last OV, no change.. refer to Dermatology.

## 2022-12-10 NOTE — Assessment & Plan Note (Signed)
Chronic, diagnosed in 2006 Followed by hematology Dr. Shirline Frees CURRENT THERAPY:  1) Hydroxyurea 500 mg by mouth daily except Friday he is on 1000 mg 2) phlebotomy on as-needed basis.

## 2022-12-10 NOTE — Assessment & Plan Note (Signed)
Chronic, well-controlled on atorvastatin 10 mg p.o. daily

## 2022-12-10 NOTE — Assessment & Plan Note (Signed)
On CPAP followed by pulmonary

## 2022-12-10 NOTE — Assessment & Plan Note (Signed)
Stable, chronic.  Continue current medication.  Lisinopril 10 mg p.o. daily

## 2022-12-12 ENCOUNTER — Other Ambulatory Visit: Payer: Self-pay | Admitting: *Deleted

## 2022-12-12 DIAGNOSIS — I83813 Varicose veins of bilateral lower extremities with pain: Secondary | ICD-10-CM

## 2022-12-13 DIAGNOSIS — S61411A Laceration without foreign body of right hand, initial encounter: Secondary | ICD-10-CM | POA: Diagnosis not present

## 2022-12-13 DIAGNOSIS — L03011 Cellulitis of right finger: Secondary | ICD-10-CM | POA: Diagnosis not present

## 2022-12-16 ENCOUNTER — Ambulatory Visit (HOSPITAL_COMMUNITY): Payer: Medicare HMO | Attending: Surgery

## 2022-12-24 ENCOUNTER — Inpatient Hospital Stay: Payer: Medicare HMO | Attending: Internal Medicine

## 2022-12-24 ENCOUNTER — Inpatient Hospital Stay: Payer: Medicare HMO | Admitting: Internal Medicine

## 2022-12-24 VITALS — BP 121/64 | HR 78 | Temp 97.7°F | Resp 18 | Ht 70.5 in | Wt 168.0 lb

## 2022-12-24 DIAGNOSIS — Z79899 Other long term (current) drug therapy: Secondary | ICD-10-CM | POA: Diagnosis not present

## 2022-12-24 DIAGNOSIS — D751 Secondary polycythemia: Secondary | ICD-10-CM | POA: Insufficient documentation

## 2022-12-24 DIAGNOSIS — E611 Iron deficiency: Secondary | ICD-10-CM | POA: Diagnosis not present

## 2022-12-24 DIAGNOSIS — Z7982 Long term (current) use of aspirin: Secondary | ICD-10-CM | POA: Diagnosis not present

## 2022-12-24 DIAGNOSIS — D473 Essential (hemorrhagic) thrombocythemia: Secondary | ICD-10-CM | POA: Insufficient documentation

## 2022-12-24 DIAGNOSIS — M79606 Pain in leg, unspecified: Secondary | ICD-10-CM | POA: Diagnosis not present

## 2022-12-24 LAB — CBC WITH DIFFERENTIAL (CANCER CENTER ONLY)
Abs Immature Granulocytes: 0.04 10*3/uL (ref 0.00–0.07)
Basophils Absolute: 0.1 10*3/uL (ref 0.0–0.1)
Basophils Relative: 1 %
Eosinophils Absolute: 0.1 10*3/uL (ref 0.0–0.5)
Eosinophils Relative: 2 %
HCT: 45.6 % (ref 39.0–52.0)
Hemoglobin: 14.4 g/dL (ref 13.0–17.0)
Immature Granulocytes: 1 %
Lymphocytes Relative: 7 %
Lymphs Abs: 0.5 10*3/uL — ABNORMAL LOW (ref 0.7–4.0)
MCH: 29.1 pg (ref 26.0–34.0)
MCHC: 31.6 g/dL (ref 30.0–36.0)
MCV: 92.1 fL (ref 80.0–100.0)
Monocytes Absolute: 0.7 10*3/uL (ref 0.1–1.0)
Monocytes Relative: 9 %
Neutro Abs: 6.3 10*3/uL (ref 1.7–7.7)
Neutrophils Relative %: 80 %
Platelet Count: 418 10*3/uL — ABNORMAL HIGH (ref 150–400)
RBC: 4.95 MIL/uL (ref 4.22–5.81)
RDW: 15.8 % — ABNORMAL HIGH (ref 11.5–15.5)
WBC Count: 7.8 10*3/uL (ref 4.0–10.5)
nRBC: 0 % (ref 0.0–0.2)

## 2022-12-24 LAB — CMP (CANCER CENTER ONLY)
ALT: 32 U/L (ref 0–44)
AST: 40 U/L (ref 15–41)
Albumin: 4.3 g/dL (ref 3.5–5.0)
Alkaline Phosphatase: 89 U/L (ref 38–126)
Anion gap: 8 (ref 5–15)
BUN: 26 mg/dL — ABNORMAL HIGH (ref 8–23)
CO2: 27 mmol/L (ref 22–32)
Calcium: 9.2 mg/dL (ref 8.9–10.3)
Chloride: 104 mmol/L (ref 98–111)
Creatinine: 1.25 mg/dL — ABNORMAL HIGH (ref 0.61–1.24)
GFR, Estimated: >60 mL/min (ref >60)
Glucose, Bld: 97 mg/dL (ref 70–99)
Potassium: 4.2 mmol/L (ref 3.5–5.1)
Sodium: 139 mmol/L (ref 135–145)
Total Bilirubin: 0.6 mg/dL (ref 0.3–1.2)
Total Protein: 6.1 g/dL — ABNORMAL LOW (ref 6.5–8.1)

## 2022-12-24 LAB — LACTATE DEHYDROGENASE: LDH: 239 U/L — ABNORMAL HIGH (ref 98–192)

## 2022-12-24 NOTE — Progress Notes (Signed)
[facility] [phone]   [phone]  OFFICE PROGRESS NOTE  Excell Seltzer, MD [address] [place] [address]  DIAGNOSIS:  1) Essential thrombocythemia diagnosed in 2006  2) androgen-induced polycythemia.  PRIOR THERAPY: None   CURRENT THERAPY:  1) Hydroxyurea 500 mg by mouth daily except Friday he is on 1000 mg 2) phlebotomy on as-needed basis.  INTERVAL HISTORY: Dennis Martin 67 y.o. male returns to the clinic today for 6 months follow-up visit.Discussed the use of AI scribe software for clinical note transcription with the patient, who gave verbal consent to proceed.  History of Present Illness   The patient, a 67 year old white male with a history of essential thrombocytemia managed with hydroxyurea, presents with complaints of nocturnal leg pain. The pain, described as similar to a 'charley horse,' is located in the back of the legs and sometimes radiates down. The discomfort is severe enough to disrupt sleep and does not appear to be related to physical activity.  The patient has been on a long-term regimen of hydroxyurea, with a dosage of 500 mg daily except for Fridays, when the dosage is doubled to 1000 mg. He also takes 81 mg of aspirin twice daily. The patient has a history of elevated red blood cells, previously managed with androgens, but recent lab results show stable levels, negating the need for phlebotomy.  The patient recently started taking Allegra (180 mg daily) for allergies, having switched from Zyrtec due to inadequate symptom control. He also reports self-administering testosterone, acknowledging that this may affect his blood cell counts.       MEDICAL HISTORY: Past Medical History:  Diagnosis Date   Anxiety    hx of - 10 years ago   Arthritis    both arthritis, wrist, hands    BPH (benign prostatic hyperplasia)    DVT of leg (deep venous thrombosis) (HCC) 2001   post knee arthroscopy,also complicated  by infection, PICC line used for long term antibiotics, was on coumadin x's 6 months   Essential thrombocytosis (HCC)    Headache    relative to testosterone level, it had been a problem, improved with clomid   History of stress test 2008   done in [place] ,nuclear stress test- told that it was normal   Hypertension    Peripheral vascular disease (HCC)    Sleep apnea    did surgery for the problem   Spleen enlarged    Thrombocythemia    monitored by Dr. Shirline Frees    ALLERGIES:  is allergic to keflex [cephalexin].  MEDICATIONS:  Current Outpatient Medications  Medication Sig Dispense Refill   aspirin EC 81 MG tablet Take 81 mg by mouth 2 (two) times daily.     atorvastatin (LIPITOR) 10 MG tablet Take 1 tablet (10 mg total) by mouth daily. 90 tablet 3   Cholecalciferol (VITAMIN D3) 50 MCG (2000 UT) TABS Take 50 mcg by mouth daily at 12 noon.     FLUoxetine (PROZAC) 20 MG capsule Take 1 capsule (20 mg total) by mouth daily. 30 capsule 5   hydroxyurea (HYDREA) 500 MG capsule TAKE 1 CAPSULE DAILY EXCEPT TAKE 2 CAPSULES ON FRIDAYS 105 capsule 4   lisinopril (ZESTRIL) 10 MG tablet Take 1 tablet (10 mg total) by mouth daily. 90 tablet 3   Multiple Vitamins-Minerals (ONE-A-DAY MENS 50+) TABS Take 1 tablet by mouth daily.     NON FORMULARY CPAP-Setting 15     NON FORMULARY  Place under the tongue in the morning, at noon, and at bedtime. .33 ml CBD Full Spectrum     tamsulosin (FLOMAX) 0.4 MG CAPS capsule 0.4 mg. 2 tablets a day at bedtime     Testosterone Cypionate 200 MG/ML SOLN Inject 0.5 mLs into the muscle every 14 (fourteen) days.     No current facility-administered medications for this visit.    SURGICAL HISTORY:  Past Surgical History:  Procedure Laterality Date   EYE SURGERY     chalazion on both eyelids   KNEE SURGERY Bilateral 2001   NASAL SINUS SURGERY     right shoulder surgery     TONSILLECTOMY     TOTAL KNEE ARTHROPLASTY Right 05/22/2015   TOTAL KNEE  ARTHROPLASTY Right 05/22/2015   Procedure: TOTAL KNEE ARTHROPLASTY;  Surgeon: Gean Birchwood, MD;  Location: [facility] OR;  Service: Orthopedics;  Laterality: Right;   TOTAL KNEE ARTHROPLASTY Left 07/17/2015   TOTAL KNEE ARTHROPLASTY Left 07/17/2015   Procedure: TOTAL KNEE ARTHROPLASTY;  Surgeon: Gean Birchwood, MD;  Location: [facility] OR;  Service: Orthopedics;  Laterality: Left;   TRIGGER FINGER RELEASE Left    ULNAR NERVE TRANSPOSITION  2007   left    REVIEW OF SYSTEMS:  A comprehensive review of systems was negative except for: Musculoskeletal: positive for leg pain    PHYSICAL EXAMINATION: General appearance: alert, cooperative, and no distress Head: Normocephalic, without obvious abnormality, atraumatic Neck: no adenopathy Lymph nodes: Cervical, supraclavicular, and axillary nodes normal. Resp: clear to auscultation bilaterally Back: symmetric, no curvature. ROM normal. No CVA tenderness. Cardio: regular rate and rhythm, S1, S2 normal, no murmur, click, rub or gallop GI: soft, non-tender; bowel sounds normal; no masses,  no organomegaly Extremities: extremities normal, atraumatic, no cyanosis or edema  ECOG PERFORMANCE STATUS: 1 - Symptomatic but completely ambulatory  Blood pressure 121/64, pulse 78, temperature 97.7 F (36.5 C), temperature source Oral, resp. rate 18, height 5' 10.5" (1.791 m), weight 168 lb (76.2 kg), SpO2 98%.  LABORATORY DATA: Lab Results  Component Value Date   WBC 7.8 12/24/2022   HGB 14.4 12/24/2022   HCT 45.6 12/24/2022   MCV 92.1 12/24/2022   PLT 418 (H) 12/24/2022      Chemistry      Component Value Date/Time   NA 138 12/04/2022 1313   NA 139 11/10/2013 1508   K 4.1 12/04/2022 1313   K 4.3 11/10/2013 1508   CL 103 12/04/2022 1313   CL 102 05/04/2012 1309   CO2 29 12/04/2022 1313   CO2 28 11/10/2013 1508   BUN 18 12/04/2022 1313   BUN 15.4 11/10/2013 1508   CREATININE 1.01 12/04/2022 1313   CREATININE 1.10 06/26/2022 1533   CREATININE 1.1 11/10/2013 1508       Component Value Date/Time   CALCIUM 8.9 12/04/2022 1313   CALCIUM 9.6 11/10/2013 1508   ALKPHOS 68 12/04/2022 1313   ALKPHOS 78 11/10/2013 1508   AST 38 (H) 12/04/2022 1313   AST 22 06/26/2022 1533   AST 19 11/10/2013 1508   ALT 35 12/04/2022 1313   ALT 16 06/26/2022 1533   ALT 15 11/10/2013 1508   BILITOT 0.8 12/04/2022 1313   BILITOT 0.9 06/26/2022 1533   BILITOT 0.43 11/10/2013 1508       RADIOGRAPHIC STUDIES: No results found.  ASSESSMENT AND PLAN: This is a very pleasant 67 years old white male with essential thrombocythemia currently on treatment with hydroxyurea 500 mg by mouth daily except Friday where the patient takes 1000 mg.  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      Essential Thrombocythemia Stable on Hydroxyurea 500mg  daily except Fridays (1000mg ). Platelet count slightly elevated at 418,000, but similar to previous levels. -Continue current Hydroxyurea regimen. -Return in 6 months for follow-up.  Leg Pain New onset, non-exertional, bilateral, described as "charley horse" pain. Noted to be severe enough to disrupt sleep. Patient has upcoming appointment with vascular surgeon. -Continue with vascular surgeon appointment for further evaluation.  Iron Deficiency Possible, given symptoms of leg pain and history of phlebotomy for elevated red blood cells. Patient requests iron studies. -Call office upon return from travel to arrange for iron and ferritin studies.  Allergies Switched from Zyrtec to Allegra 180mg  daily due to inadequate symptom control. -Continue Allegra 180mg  daily for allergy symptoms.   The patient was advised to call immediately if he has any other concerning symptoms in the interval. The patient voices understanding of current disease status and treatment options and is in agreement with the current care plan.  All questions were answered. The  patient knows to call the clinic with any problems, questions or concerns. We can certainly see the patient much sooner if necessary.   Disclaimer: This note was dictated with voice recognition software. Similar sounding words can inadvertently be transcribed and may be missed upon review.

## 2023-01-03 ENCOUNTER — Telehealth: Payer: Self-pay | Admitting: Internal Medicine

## 2023-01-05 DIAGNOSIS — R0789 Other chest pain: Secondary | ICD-10-CM | POA: Diagnosis not present

## 2023-01-05 DIAGNOSIS — M25562 Pain in left knee: Secondary | ICD-10-CM | POA: Diagnosis not present

## 2023-01-05 DIAGNOSIS — S59902A Unspecified injury of left elbow, initial encounter: Secondary | ICD-10-CM | POA: Diagnosis not present

## 2023-01-06 NOTE — Progress Notes (Unsigned)
[facility] [place]  ASSESSMENT / PLAN: Dennis Martin is a 67 y.o. male with chronic venous insufficiency of bilateral lower extremity causing mild edema (C3 disease).  Patient no-showed pre-visit venous duplex. Recommend compression and elevation for symptomatic relief. Follow up with me in three months with venous reflux scan.  CHIEF COMPLAINT: bilateral leg pain  HISTORY OF PRESENT ILLNESS: Dennis Martin is a 67 y.o. male who presents to clinic for evaluation of bilateral lower extremity venous insufficiency. He has a multitude of complaints. He reports mild swelling in feet and ankles bilaterally.  He describes multifactorial lower extremity pain, and recent minor trauma from which he is still recovering.  I reviewed some common symptoms of chronic venous insufficiency with him, and he reported these were all symptoms he was having.  He has no inflammatory skin changes of the pretibial skin bilaterally.  He has never had a venous ulcer.   Past Medical History:  Diagnosis Date   Anxiety    hx of - 10 years ago   Arthritis    both arthritis, wrist, hands    BPH (benign prostatic hyperplasia)    DVT of leg (deep venous thrombosis) (HCC) 2001   post knee arthroscopy,also complicated by infection, PICC line used for long term antibiotics, was on coumadin x's 6 months   Essential thrombocytosis (HCC)    Headache    relative to testosterone level, it had been a problem, improved with clomid   History of stress test 2008   done in [place] ,nuclear stress test- told that it was normal   Hypertension    Peripheral vascular disease (HCC)    Sleep apnea    did surgery for the problem   Spleen enlarged    Thrombocythemia    monitored by Dr. Shirline Frees    Past Surgical History:  Procedure Laterality Date   EYE SURGERY     chalazion on both eyelids   KNEE SURGERY Bilateral 2001   NASAL SINUS SURGERY     right shoulder surgery     TONSILLECTOMY     TOTAL KNEE  ARTHROPLASTY Right 05/22/2015   TOTAL KNEE ARTHROPLASTY Right 05/22/2015   Procedure: TOTAL KNEE ARTHROPLASTY;  Surgeon: Gean Birchwood, MD;  Location: [facility];  Service: Orthopedics;  Laterality: Right;   TOTAL KNEE ARTHROPLASTY Left 07/17/2015   TOTAL KNEE ARTHROPLASTY Left 07/17/2015   Procedure: TOTAL KNEE ARTHROPLASTY;  Surgeon: Gean Birchwood, MD;  Location: [facility] OR;  Service: Orthopedics;  Laterality: Left;   TRIGGER FINGER RELEASE Left    ULNAR NERVE TRANSPOSITION  2007   left    Family History  Problem Relation Age of Onset   Heart attack Mother 7   Other Brother        brain aneyurism   Colon cancer Paternal Aunt 11   Stomach cancer Neg Hx     Social History   Socioeconomic History   Marital status: Married    Spouse name: Science writer   Number of children: 2   Years of education: high school   Highest education level: 12th grade  Occupational History   Not on file  Tobacco Use   Smoking status: Former    Current packs/day: 0.00    Average packs/day: 1 pack/day for 25.0 years (25.0 ttl pk-yrs)    Types: Cigarettes    Start date: 05/05/1978    Quit date: 05/06/2003    Years since quitting: 19.6   Smokeless tobacco: Never   Tobacco comments:  Quit 2005  Vaping Use   Vaping status: Never Used  Substance and Sexual Activity   Alcohol use: Yes    Alcohol/week: 7.0 standard drinks of alcohol    Types: 7 Cans of beer per week    Comment: 0-1 beers daily   Drug use: No   Sexual activity: Yes    Birth control/protection: Post-menopausal  Other Topics Concern   Not on file  Social History Narrative   05/11/19   From: [place] originally, moved to [place] in 1988    Living: with Lesle Reek, wife since 2002   Work: data center management work      Family: 2 sons - Cristal Deer and Caryn Bee - no grandchildren       Enjoys: target shooting      Exercise: multiple joint issues has limited   Diet: normal      Safety   Seat belts: Yes    Guns: Yes  and secure   Safe in relationships:  Yes    Social Determinants of Health   Financial Resource Strain: Low Risk  (09/04/2022)   Overall Financial Resource Strain (CARDIA)    Difficulty of Paying Living Expenses: Not very hard  Food Insecurity: No Food Insecurity (09/04/2022)   Hunger Vital Sign    Worried About Running Out of Food in the Last Year: Never true    Ran Out of Food in the Last Year: Never true  Transportation Needs: No Transportation Needs (09/04/2022)   PRAPARE - Administrator, Civil Service (Medical): No    Lack of Transportation (Non-Medical): No  Physical Activity: Unknown (09/04/2022)   Exercise Vital Sign    Days of Exercise per Week: 0 days    Minutes of Exercise per Session: Not on file  Stress: Stress Concern Present (09/04/2022)   [employer] of Occupational Health - Occupational Stress Questionnaire    Feeling of Stress : Rather much  Social Connections: Socially Isolated (09/04/2022)   Social Connection and Isolation Panel [NHANES]    Frequency of Communication with Friends and Family: Twice a week    Frequency of Social Gatherings with Friends and Family: Never    Attends Religious Services: Never    Database administrator or Organizations: No    Attends Engineer, structural: Not on file    Marital Status: Married  Catering manager Violence: Not on file    Allergies  Allergen Reactions   Keflex [Cephalexin] Rash    Current Outpatient Medications  Medication Sig Dispense Refill   HYDROcodone-acetaminophen (NORCO) 7.5-325 MG tablet Take 1 tablet by mouth 4 (four) times daily as needed.     methocarbamol (ROBAXIN) 500 MG tablet Take by mouth.     aspirin EC 81 MG tablet Take 81 mg by mouth 2 (two) times daily.     atorvastatin (LIPITOR) 10 MG tablet Take 1 tablet (10 mg total) by mouth daily. 90 tablet 3   Cholecalciferol (VITAMIN D3) 50 MCG (2000 UT) TABS Take 50 mcg by mouth daily at 12 noon.     fexofenadine (ALLEGRA) 180 MG tablet Take 180 mg by mouth daily.      FLUoxetine (PROZAC) 20 MG capsule Take 1 capsule (20 mg total) by mouth daily. 30 capsule 5   hydroxyurea (HYDREA) 500 MG capsule TAKE 1 CAPSULE DAILY EXCEPT TAKE 2 CAPSULES ON FRIDAYS 105 capsule 4   lisinopril (ZESTRIL) 10 MG tablet Take 1 tablet (10 mg total) by mouth daily. 90 tablet 3   Multiple Vitamins-Minerals (  ONE-A-DAY MENS 50+) TABS Take 1 tablet by mouth daily.     NON FORMULARY CPAP-Setting 15     NON FORMULARY Place under the tongue in the morning, at noon, and at bedtime. .33 ml CBD Full Spectrum     tamsulosin (FLOMAX) 0.4 MG CAPS capsule 0.4 mg. 2 tablets a day at bedtime     Testosterone Cypionate 200 MG/ML SOLN Inject 0.5 mLs into the muscle every 14 (fourteen) days.     No current facility-administered medications for this visit.    PHYSICAL EXAM Vitals:   01/07/23 1326  BP: 138/75  Pulse: 77  Temp: 98.5 F (36.9 C)  TempSrc: Temporal  SpO2: 98%  Weight: 170 lb 11.2 oz (77.4 kg)  Height: 5\' 10"  (1.778 m)    Middle aged man in no distress Regular rate and rhythm 2+ DP pulses Mild edema about ankles bilaterally Reticular veins across both legs   PERTINENT LABORATORY AND RADIOLOGIC DATA  Most recent CBC    Latest Ref Rng & Units 12/24/2022    3:52 PM 06/26/2022    3:33 PM 12/06/2021   11:04 AM  CBC  WBC 4.0 - 10.5 K/uL 7.8  9.6  9.9   Hemoglobin 13.0 - 17.0 g/dL 29.5  28.4  13.2   Hematocrit 39.0 - 52.0 % 45.6  47.2  49.5   Platelets 150 - 400 K/uL 418  411  459.0      Most recent CMP    Latest Ref Rng & Units 12/24/2022    3:52 PM 12/04/2022    1:13 PM 06/26/2022    3:33 PM  CMP  Glucose 70 - 99 mg/dL 97  75  440   BUN 8 - 23 mg/dL 26  18  24    Creatinine 0.61 - 1.24 mg/dL 1.02  7.25  3.66   Sodium 135 - 145 mmol/L 139  138  137   Potassium 3.5 - 5.1 mmol/L 4.2  4.1  4.5   Chloride 98 - 111 mmol/L 104  103  103   CO2 22 - 32 mmol/L 27  29  27    Calcium 8.9 - 10.3 mg/dL 9.2  8.9  8.8   Total Protein 6.5 - 8.1 g/dL 6.1  5.6  6.4   Total  Bilirubin 0.3 - 1.2 mg/dL 0.6  0.8  0.9   Alkaline Phos 38 - 126 U/L 89  68  62   AST 15 - 41 U/L 40  38  22   ALT 0 - 44 U/L 32  35  16     Renal function Estimated Creatinine Clearance: 60.8 mL/min (A) (by C-G formula based on SCr of 1.25 mg/dL (H)).  No results found for: "HGBA1C"  LDL Cholesterol  Date Value Ref Range Status  12/04/2022 46 0 - 99 mg/dL Final    Rande Brunt. Lenell Antu, MD [facility] Phone Number: [phone] 01/07/2023 4:56 PM   Total time spent on preparing this encounter including chart review, data review, collecting history, examining the patient, coordinating care for this new patient, 45 minutes.  Portions of this report may have been transcribed using voice recognition software.  Every effort has been made to ensure accuracy; however, inadvertent computerized transcription errors may still be present.

## 2023-01-07 ENCOUNTER — Inpatient Hospital Stay: Payer: Medicare HMO

## 2023-01-07 ENCOUNTER — Ambulatory Visit: Payer: Medicare HMO | Admitting: Vascular Surgery

## 2023-01-07 ENCOUNTER — Encounter: Payer: Self-pay | Admitting: Vascular Surgery

## 2023-01-07 VITALS — BP 138/75 | HR 77 | Temp 98.5°F | Ht 70.0 in | Wt 170.7 lb

## 2023-01-07 DIAGNOSIS — M7989 Other specified soft tissue disorders: Secondary | ICD-10-CM

## 2023-01-07 DIAGNOSIS — I872 Venous insufficiency (chronic) (peripheral): Secondary | ICD-10-CM

## 2023-01-08 ENCOUNTER — Ambulatory Visit (INDEPENDENT_AMBULATORY_CARE_PROVIDER_SITE_OTHER): Payer: Medicare HMO | Admitting: Family Medicine

## 2023-01-08 ENCOUNTER — Encounter: Payer: Self-pay | Admitting: Family Medicine

## 2023-01-08 VITALS — BP 138/60 | HR 70 | Temp 97.8°F | Ht 70.0 in | Wt 170.0 lb

## 2023-01-08 DIAGNOSIS — W19XXXD Unspecified fall, subsequent encounter: Secondary | ICD-10-CM | POA: Diagnosis not present

## 2023-01-08 DIAGNOSIS — R0789 Other chest pain: Secondary | ICD-10-CM | POA: Diagnosis not present

## 2023-01-08 DIAGNOSIS — M25562 Pain in left knee: Secondary | ICD-10-CM | POA: Diagnosis not present

## 2023-01-08 DIAGNOSIS — M25561 Pain in right knee: Secondary | ICD-10-CM

## 2023-01-08 DIAGNOSIS — W19XXXA Unspecified fall, initial encounter: Secondary | ICD-10-CM | POA: Insufficient documentation

## 2023-01-08 MED ORDER — DICLOFENAC SODIUM 75 MG PO TBEC: 75.0000 mg | DELAYED_RELEASE_TABLET | Freq: Two times a day (BID) | ORAL | 0 refills | Status: DC

## 2023-01-08 NOTE — Assessment & Plan Note (Signed)
Acute, full range of motion and bilateral knee replacements.  No abnormalities seen on exam but given knee replacements encouraged him to follow-up with Dr. Turner Daniels orthopedics for evaluation. Plain films per report initial read negative.

## 2023-01-08 NOTE — Assessment & Plan Note (Signed)
Acute, no preceding symptoms, accidental.
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
## 2023-01-08 NOTE — Assessment & Plan Note (Signed)
Acute, negative rib films.  Likely musculoskeletal, chest wall injury, bruised rib.  Can add diclofenac 75 mg twice daily as needed for pain and inflammation.  Can continue to use hydrocodone for breakthrough pain.  If he does not feel methocarbamol is helping given minimal muscle spasm he can hold this medication.  Lungs clear to auscultation bilaterally and no clear sign of pneumonia but we did discuss how he needs to take deep breaths to prevent this.  Return and ER precautions provided

## 2023-01-10 ENCOUNTER — Telehealth: Payer: Self-pay | Admitting: Family Medicine

## 2023-01-10 MED ORDER — DICLOFENAC SODIUM 75 MG PO TBEC: 75.0000 mg | DELAYED_RELEASE_TABLET | Freq: Two times a day (BID) | ORAL | 0 refills | Status: DC

## 2023-01-10 MED ORDER — HYDROCODONE-ACETAMINOPHEN 7.5-325 MG PO TABS: 1.0000 | ORAL_TABLET | Freq: Four times a day (QID) | ORAL | 0 refills | Status: DC | PRN

## 2023-01-10 NOTE — Telephone Encounter (Signed)
I will send him in a refill of the hydrocodone for him to continue using as needed for breakthrough pain.  Otherwise with rib fractures, the primary goal is to continue to take deep breaths to avoid pneumonia and pain control.  If pain is not controlled with the hydrocodone have him please let me know.  Also if he has increasing cough, productive, new fever or shortness of breath he needs to be seen in person over the weekend.

## 2023-01-10 NOTE — Telephone Encounter (Signed)
Patient called in and would like to speak with Dr. Ermalene Searing in regards to his x-ray results he got back. He stated that he may need something for over the weekend. Thank you!

## 2023-01-10 NOTE — Telephone Encounter (Signed)
Please triage

## 2023-01-10 NOTE — Addendum Note (Signed)
Addended by: Damita Lack on: 01/10/2023 05:18 PM   Modules accepted: Orders

## 2023-01-10 NOTE — Telephone Encounter (Signed)
Dennis Martin notified as instructed by telephone.  Patient states understanding.  He also requested refill on the diclofenac.  Refill sent as requested per Dr. Ermalene Searing.
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
## 2023-01-10 NOTE — Addendum Note (Signed)
Addended by: Kerby Nora E on: 01/10/2023 05:00 PM   Modules accepted: Orders

## 2023-01-20 ENCOUNTER — Encounter: Payer: Self-pay | Admitting: Family Medicine

## 2023-01-20 ENCOUNTER — Inpatient Hospital Stay: Payer: Medicare HMO

## 2023-01-20 NOTE — Telephone Encounter (Signed)
error 

## 2023-01-31 ENCOUNTER — Other Ambulatory Visit: Payer: Self-pay

## 2023-01-31 DIAGNOSIS — I872 Venous insufficiency (chronic) (peripheral): Secondary | ICD-10-CM

## 2023-01-31 DIAGNOSIS — Z0100 Encounter for examination of eyes and vision without abnormal findings: Secondary | ICD-10-CM | POA: Diagnosis not present

## 2023-01-31 DIAGNOSIS — H5203 Hypermetropia, bilateral: Secondary | ICD-10-CM | POA: Diagnosis not present

## 2023-02-22 ENCOUNTER — Other Ambulatory Visit: Payer: Self-pay | Admitting: Family Medicine

## 2023-02-26 DIAGNOSIS — H33031 Retinal detachment with giant retinal tear, right eye: Secondary | ICD-10-CM | POA: Diagnosis not present

## 2023-02-26 DIAGNOSIS — Z01818 Encounter for other preprocedural examination: Secondary | ICD-10-CM | POA: Diagnosis not present

## 2023-02-26 DIAGNOSIS — G4733 Obstructive sleep apnea (adult) (pediatric): Secondary | ICD-10-CM | POA: Diagnosis not present

## 2023-03-05 DIAGNOSIS — H33031 Retinal detachment with giant retinal tear, right eye: Secondary | ICD-10-CM | POA: Diagnosis not present

## 2023-03-13 DIAGNOSIS — M25562 Pain in left knee: Secondary | ICD-10-CM | POA: Diagnosis not present

## 2023-03-18 DIAGNOSIS — M170 Bilateral primary osteoarthritis of knee: Secondary | ICD-10-CM | POA: Diagnosis not present

## 2023-03-18 DIAGNOSIS — M1712 Unilateral primary osteoarthritis, left knee: Secondary | ICD-10-CM | POA: Diagnosis not present

## 2023-03-18 DIAGNOSIS — M1711 Unilateral primary osteoarthritis, right knee: Secondary | ICD-10-CM | POA: Diagnosis not present

## 2023-04-21 ENCOUNTER — Ambulatory Visit (HOSPITAL_COMMUNITY): Payer: Medicare (Managed Care)

## 2023-04-21 ENCOUNTER — Ambulatory Visit: Payer: Medicare (Managed Care) | Admitting: Surgery

## 2023-05-01 DIAGNOSIS — M7062 Trochanteric bursitis, left hip: Secondary | ICD-10-CM | POA: Diagnosis not present

## 2023-05-01 DIAGNOSIS — M25561 Pain in right knee: Secondary | ICD-10-CM | POA: Diagnosis not present

## 2023-05-01 DIAGNOSIS — M25562 Pain in left knee: Secondary | ICD-10-CM | POA: Diagnosis not present

## 2023-05-02 DIAGNOSIS — Z09 Encounter for follow-up examination after completed treatment for conditions other than malignant neoplasm: Secondary | ICD-10-CM | POA: Diagnosis not present

## 2023-05-02 DIAGNOSIS — H33031 Retinal detachment with giant retinal tear, right eye: Secondary | ICD-10-CM | POA: Diagnosis not present

## 2023-05-22 DIAGNOSIS — M65331 Trigger finger, right middle finger: Secondary | ICD-10-CM | POA: Diagnosis not present

## 2023-05-27 DIAGNOSIS — M65331 Trigger finger, right middle finger: Secondary | ICD-10-CM | POA: Diagnosis not present

## 2023-05-27 DIAGNOSIS — M1811 Unilateral primary osteoarthritis of first carpometacarpal joint, right hand: Secondary | ICD-10-CM | POA: Diagnosis not present

## 2023-05-28 ENCOUNTER — Telehealth: Payer: Self-pay | Admitting: Internal Medicine

## 2023-05-28 NOTE — Telephone Encounter (Signed)
 Fax received from Dr. Mack Hook with [facility] to perform a right thumb suspension arthroplasty and metacarpophalangeal joint hyperextentio  on patient.  Patient needs surgery clearance. Surgery is pending. Patient was seen on 03/07/22. Office protocol is a risk assessment can be sent to surgeon if patient has been seen in 60 days or less.   I called the pt to get him scheduled for ov with Dr. Maple Hudson for risk assessment. He states that he plans to forego the surgery for now. He is aware that he is overdue visit here, and states he plans to call back to schedule appt.   Closing encounter.

## 2023-05-30 DIAGNOSIS — H35412 Lattice degeneration of retina, left eye: Secondary | ICD-10-CM | POA: Diagnosis not present

## 2023-06-02 DIAGNOSIS — M79675 Pain in left toe(s): Secondary | ICD-10-CM | POA: Diagnosis not present

## 2023-06-02 DIAGNOSIS — M79674 Pain in right toe(s): Secondary | ICD-10-CM | POA: Diagnosis not present

## 2023-06-23 DIAGNOSIS — H2512 Age-related nuclear cataract, left eye: Secondary | ICD-10-CM | POA: Diagnosis not present

## 2023-06-23 DIAGNOSIS — H25811 Combined forms of age-related cataract, right eye: Secondary | ICD-10-CM | POA: Diagnosis not present

## 2023-06-23 DIAGNOSIS — Z01818 Encounter for other preprocedural examination: Secondary | ICD-10-CM | POA: Diagnosis not present

## 2023-06-24 ENCOUNTER — Inpatient Hospital Stay (HOSPITAL_BASED_OUTPATIENT_CLINIC_OR_DEPARTMENT_OTHER): Payer: Medicare HMO | Admitting: Internal Medicine

## 2023-06-24 ENCOUNTER — Inpatient Hospital Stay: Payer: Medicare HMO | Attending: Internal Medicine

## 2023-06-24 VITALS — BP 133/63 | HR 60 | Temp 97.9°F | Resp 18 | Ht 70.0 in | Wt 173.4 lb

## 2023-06-24 DIAGNOSIS — Z79899 Other long term (current) drug therapy: Secondary | ICD-10-CM | POA: Insufficient documentation

## 2023-06-24 DIAGNOSIS — D473 Essential (hemorrhagic) thrombocythemia: Secondary | ICD-10-CM

## 2023-06-24 DIAGNOSIS — G629 Polyneuropathy, unspecified: Secondary | ICD-10-CM | POA: Insufficient documentation

## 2023-06-24 DIAGNOSIS — D751 Secondary polycythemia: Secondary | ICD-10-CM | POA: Diagnosis not present

## 2023-06-24 LAB — CMP (CANCER CENTER ONLY)
ALT: 16 U/L (ref 0–44)
AST: 20 U/L (ref 15–41)
Albumin: 4 g/dL (ref 3.5–5.0)
Alkaline Phosphatase: 70 U/L (ref 38–126)
Anion gap: 5 (ref 5–15)
BUN: 18 mg/dL (ref 8–23)
CO2: 30 mmol/L (ref 22–32)
Calcium: 8.8 mg/dL — ABNORMAL LOW (ref 8.9–10.3)
Chloride: 104 mmol/L (ref 98–111)
Creatinine: 1.01 mg/dL (ref 0.61–1.24)
GFR, Estimated: >60 mL/min (ref >60)
Glucose, Bld: 93 mg/dL (ref 70–99)
Potassium: 4.3 mmol/L (ref 3.5–5.1)
Sodium: 139 mmol/L (ref 135–145)
Total Bilirubin: 0.6 mg/dL (ref 0.0–1.2)
Total Protein: 5.8 g/dL — ABNORMAL LOW (ref 6.5–8.1)

## 2023-06-24 LAB — CBC WITH DIFFERENTIAL (CANCER CENTER ONLY)
Abs Immature Granulocytes: 0.04 10*3/uL (ref 0.00–0.07)
Basophils Absolute: 0.1 10*3/uL (ref 0.0–0.1)
Basophils Relative: 1 %
Eosinophils Absolute: 0.2 10*3/uL (ref 0.0–0.5)
Eosinophils Relative: 2 %
HCT: 44.5 % (ref 39.0–52.0)
Hemoglobin: 13.8 g/dL (ref 13.0–17.0)
Immature Granulocytes: 0 %
Lymphocytes Relative: 9 %
Lymphs Abs: 1 10*3/uL (ref 0.7–4.0)
MCH: 28.9 pg (ref 26.0–34.0)
MCHC: 31 g/dL (ref 30.0–36.0)
MCV: 93.3 fL (ref 80.0–100.0)
Monocytes Absolute: 0.7 10*3/uL (ref 0.1–1.0)
Monocytes Relative: 7 %
Neutro Abs: 8.5 10*3/uL — ABNORMAL HIGH (ref 1.7–7.7)
Neutrophils Relative %: 81 %
Platelet Count: 465 10*3/uL — ABNORMAL HIGH (ref 150–400)
RBC: 4.77 MIL/uL (ref 4.22–5.81)
RDW: 17.8 % — ABNORMAL HIGH (ref 11.5–15.5)
WBC Count: 10.6 10*3/uL — ABNORMAL HIGH (ref 4.0–10.5)
nRBC: 0 % (ref 0.0–0.2)

## 2023-06-24 LAB — IRON AND IRON BINDING CAPACITY (CC-WL,HP ONLY)
Iron: 42 ug/dL — ABNORMAL LOW (ref 45–182)
Saturation Ratios: 9 % — ABNORMAL LOW (ref 17.9–39.5)
TIBC: 461 ug/dL — ABNORMAL HIGH (ref 250–450)
UIBC: 419 ug/dL — ABNORMAL HIGH (ref 117–376)

## 2023-06-24 LAB — LACTATE DEHYDROGENASE: LDH: 188 U/L (ref 98–192)

## 2023-06-24 NOTE — Addendum Note (Signed)
 Addended by: Charma Igo on: 06/24/2023 04:41 PM   Modules accepted: Orders
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
## 2023-06-25 LAB — FERRITIN: Ferritin: 14 ng/mL — ABNORMAL LOW (ref 24–336)

## 2023-06-30 DIAGNOSIS — E291 Testicular hypofunction: Secondary | ICD-10-CM | POA: Diagnosis not present

## 2023-06-30 DIAGNOSIS — R948 Abnormal results of function studies of other organs and systems: Secondary | ICD-10-CM | POA: Diagnosis not present

## 2023-07-02 DIAGNOSIS — M79661 Pain in right lower leg: Secondary | ICD-10-CM | POA: Diagnosis not present

## 2023-07-02 DIAGNOSIS — G4733 Obstructive sleep apnea (adult) (pediatric): Secondary | ICD-10-CM | POA: Diagnosis not present

## 2023-07-02 DIAGNOSIS — H25811 Combined forms of age-related cataract, right eye: Secondary | ICD-10-CM | POA: Diagnosis not present

## 2023-07-02 DIAGNOSIS — I10 Essential (primary) hypertension: Secondary | ICD-10-CM | POA: Diagnosis not present

## 2023-07-03 ENCOUNTER — Ambulatory Visit (HOSPITAL_COMMUNITY)
Admission: RE | Admit: 2023-07-03 | Discharge: 2023-07-03 | Disposition: A | Discharge status: Home / Self Care | Source: Ambulatory Visit | Attending: Orthopaedic Surgery | Admitting: Orthopaedic Surgery

## 2023-07-03 ENCOUNTER — Other Ambulatory Visit (HOSPITAL_COMMUNITY): Payer: Self-pay | Admitting: Orthopaedic Surgery

## 2023-07-03 DIAGNOSIS — M79604 Pain in right leg: Secondary | ICD-10-CM | POA: Diagnosis not present

## 2023-07-03 DIAGNOSIS — M7989 Other specified soft tissue disorders: Secondary | ICD-10-CM | POA: Insufficient documentation

## 2023-07-09 DIAGNOSIS — H35412 Lattice degeneration of retina, left eye: Secondary | ICD-10-CM | POA: Diagnosis not present

## 2023-07-16 DIAGNOSIS — I10 Essential (primary) hypertension: Secondary | ICD-10-CM | POA: Diagnosis not present

## 2023-07-16 DIAGNOSIS — H2512 Age-related nuclear cataract, left eye: Secondary | ICD-10-CM | POA: Diagnosis not present

## 2023-07-16 DIAGNOSIS — H25812 Combined forms of age-related cataract, left eye: Secondary | ICD-10-CM | POA: Diagnosis not present

## 2023-07-16 DIAGNOSIS — G4733 Obstructive sleep apnea (adult) (pediatric): Secondary | ICD-10-CM | POA: Diagnosis not present

## 2023-07-18 DIAGNOSIS — E291 Testicular hypofunction: Secondary | ICD-10-CM | POA: Diagnosis not present

## 2023-07-18 DIAGNOSIS — R350 Frequency of micturition: Secondary | ICD-10-CM | POA: Diagnosis not present

## 2023-07-18 DIAGNOSIS — N401 Enlarged prostate with lower urinary tract symptoms: Secondary | ICD-10-CM | POA: Diagnosis not present

## 2023-07-21 ENCOUNTER — Ambulatory Visit: Payer: Medicare (Managed Care) | Attending: Surgery | Admitting: Surgery

## 2023-07-21 ENCOUNTER — Encounter: Payer: Self-pay | Admitting: Surgery

## 2023-07-21 ENCOUNTER — Ambulatory Visit (HOSPITAL_COMMUNITY)
Admission: RE | Admit: 2023-07-21 | Discharge: 2023-07-21 | Disposition: A | Discharge status: Home / Self Care | Payer: Medicare (Managed Care) | Source: Ambulatory Visit | Attending: Surgery | Admitting: Surgery

## 2023-07-21 VITALS — BP 132/75 | HR 63 | Temp 98.0°F | Resp 20 | Ht 70.0 in | Wt 172.0 lb

## 2023-07-21 DIAGNOSIS — I872 Venous insufficiency (chronic) (peripheral): Secondary | ICD-10-CM | POA: Diagnosis not present

## 2023-07-21 DIAGNOSIS — I83813 Varicose veins of bilateral lower extremities with pain: Secondary | ICD-10-CM

## 2023-07-21 NOTE — Progress Notes (Signed)
 [facility]  Patient name: Dennis Martin MRN: [id number] DOB: September 24, 1955 Sex: male   REASON FOR VISIT:    Follow up  HISOTRY OF PRESENT ILLNESS:    Dennis Martin is a 68 y.o. male who was evaluated by Dr. Edgardo Goodwill in October 2024 for bilateral leg pain.  The patient reported mild swelling in his feet and ankles bilaterally.  He is also recovering from a minor trauma.  There is no evidence of skin changes or ulceration.  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  He was placed in compression and encouraged to keep his legs elevated.  He returns today for venous reflux duplex.  He states that the right leg swelling has gotten a little better.  The compression socks made his leg hurt.   PAST MEDICAL HISTORY:   Past Medical History:  Diagnosis Date   Anxiety    hx of - 10 years ago   Arthritis    both arthritis, wrist, hands    BPH (benign prostatic hyperplasia)    DVT of leg (deep venous thrombosis) (HCC) 2001   post knee arthroscopy,also complicated by infection, PICC line used for long term antibiotics, was on coumadin x's 6 months   Essential thrombocytosis (HCC)    Headache    relative to testosterone  level, it had been a problem, improved with clomid    History of stress test 2008   done in [place]  ,nuclear stress test- told that it was normal   Hypertension    Peripheral vascular disease (HCC)    Sleep apnea    did surgery for the problem   Spleen enlarged    Thrombocythemia    monitored by Dr. Liam Redhead     FAMILY HISTORY:   Family History  Problem Relation Age of Onset   Heart attack Mother 74   Other Brother        brain aneyurism   Colon cancer Paternal Aunt 59   Stomach cancer Neg Hx     SOCIAL HISTORY:   Social History   Tobacco Use   Smoking status: Former    Current packs/day: 0.00    Average packs/day: 1 pack/day for 25.0 years (25.0 ttl pk-yrs)    Types: Cigarettes    Start  date: 05/05/1978    Quit date: 05/06/2003    Years since quitting: 20.2   Smokeless tobacco: Never   Tobacco comments:    Quit 2005  Substance Use Topics   Alcohol use: Yes    Alcohol/week: 7.0 standard drinks of alcohol    Types: 7 Cans of beer per week    Comment: 0-1 beers daily     ALLERGIES:   Allergies  Allergen Reactions   Keflex  [Cephalexin ] Rash     CURRENT MEDICATIONS:   Current Outpatient Medications  Medication Sig Dispense Refill   aspirin  EC 81 MG tablet Take 81 mg by mouth 2 (two) times daily.     atorvastatin  (LIPITOR) 10 MG tablet Take 1 tablet (10 mg total) by mouth daily. 90 tablet 3   Cholecalciferol (VITAMIN D3) 50 MCG (2000 UT) TABS Take 50 mcg by mouth daily at 12 noon.     FLUoxetine  (PROZAC ) 20 MG capsule Take 1 capsule by mouth once daily 30 capsule 5   hydroxyurea  (HYDREA ) 500 MG capsule TAKE 1 CAPSULE DAILY EXCEPT TAKE 2 CAPSULES ON FRIDAYS 105 capsule 4   lisinopril  (ZESTRIL ) 10 MG tablet Take 1 tablet (10 mg total) by mouth daily. 90  tablet 3   Multiple Vitamins-Minerals (ONE-A-DAY MENS 50+) TABS Take 1 tablet by mouth daily.     NON FORMULARY CPAP-Setting 15     NON FORMULARY Place under the tongue in the morning, at noon, and at bedtime. .33 ml CBD Full Spectrum     tamsulosin  (FLOMAX ) 0.4 MG CAPS capsule 0.4 mg. 2 tablets a day at bedtime     Testosterone  Cypionate 200 MG/ML SOLN Inject 0.5 mLs into the muscle every 14 (fourteen) days.     No current facility-administered medications for this visit.    REVIEW OF SYSTEMS:   [X]  denotes positive finding, [ ]  denotes negative finding Cardiac  Comments:  Chest pain or chest pressure:    Shortness of breath upon exertion:    Short of breath when lying flat:    Irregular heart rhythm:        Vascular    Pain in calf, thigh, or hip brought on by ambulation:    Pain in feet at night that wakes you up from your sleep:     Blood clot in your veins:    Leg swelling:         Pulmonary     Oxygen at home:    Productive cough:     Wheezing:         Neurologic    Sudden weakness in arms or legs:     Sudden numbness in arms or legs:     Sudden onset of difficulty speaking or slurred speech:    Temporary loss of vision in one eye:     Problems with dizziness:         Gastrointestinal    Blood in stool:     Vomited blood:         Genitourinary    Burning when urinating:     Blood in urine:        Psychiatric    Major depression:         Hematologic    Bleeding problems:    Problems with blood clotting too easily:        Skin    Rashes or ulcers:        Constitutional    Fever or chills:      PHYSICAL EXAM:   Vitals:   07/21/23 1203  BP: 132/75  Pulse: 63  Resp: 20  Temp: 98 F (36.7 C)  SpO2: 97%  Weight: 172 lb (78 kg)  Height: 5\' 10"  (1.778 m)    GENERAL: The patient is a well-nourished male, in no acute distress. The vital signs are documented above. CARDIAC: There is a regular rate and rhythm.  VASCULAR: Trace edema bilaterally.  Palpable pedal pulses PULMONARY: Non-labored respirations ABDOMEN: Soft and non-tender with normal pitched bowel sounds.  MUSCULOSKELETAL: There are no major deformities or cyanosis. NEUROLOGIC: No focal weakness or paresthesias are detected. SKIN: There are no ulcers or rashes noted. PSYCHIATRIC: The patient has a normal affect.  STUDIES:   I have reviewed the following reflux study: RIGHT         Reflux NoRefluxReflux TimeDiameter cmsComments                                  Yes                                            +--------------+---------+------+-----------+------------+----------------+  CFV          no                                                       +--------------+---------+------+-----------+------------+----------------+   FV prox       no                                                        +--------------+---------+------+-----------+------------+----------------+   Popliteal    no                                                       +--------------+---------+------+-----------+------------+----------------+   GSV at SFJ    no                            .43                        +--------------+---------+------+-----------+------------+----------------+   GSV prox thighno                            .39                        +--------------+---------+------+-----------+------------+----------------+   GSV mid thigh no                            .28                        +--------------+---------+------+-----------+------------+----------------+   GSV dist thighno                            .20                        +--------------+---------+------+-----------+------------+----------------+   GSV at knee   no                            .34                        +--------------+---------+------+-----------+------------+----------------+   GSV prox calf no                            .21                        +--------------+---------+------+-----------+------------+----------------+   GSV mid calf            yes    >500 ms      .24                        +--------------+---------+------+-----------+------------+----------------+  GSV dist calf           yes    >500 ms      .31     chronic  thrombus  +--------------+---------+------+-----------+------------+----------------+   SSV Giacomini no                            .21                        +--------------+---------+------+-----------+------------+----------------+   SSV prox calf no                            .21     chronic  thrombus  +--------------+---------+------+-----------+------------+----------------+   SSV mid calf  no                            .22                         +--------------+---------+------+-----------+------------+----------------+      +--------------+---------+------+-----------+------------+----------------+   LEFT         Reflux NoRefluxReflux TimeDiameter cmsComments                                  Yes                                            +--------------+---------+------+-----------+------------+----------------+   CFV          no                                                       +--------------+---------+------+-----------+------------+----------------+   FV prox       no                                                       +--------------+---------+------+-----------+------------+----------------+   Popliteal    no                                                       +--------------+---------+------+-----------+------------+----------------+   GSV at SFJ    no                            .36                        +--------------+---------+------+-----------+------------+----------------+   GSV prox thighno                            .36                        +--------------+---------+------+-----------+------------+----------------+  GSV mid thigh no                            .31                        +--------------+---------+------+-----------+------------+----------------+   GSV dist thighno                            .23                        +--------------+---------+------+-----------+------------+----------------+   GSV at knee   no                            .24                        +--------------+---------+------+-----------+------------+----------------+   GSV prox calf           yes    >500 ms      .25                        +--------------+---------+------+-----------+------------+----------------+   GSV mid calf            yes    >500 ms      .26     chronic  thrombus   +--------------+---------+------+-----------+------------+----------------+   GSV dist calf no                            .19     chronic  thrombus  +--------------+---------+------+-----------+------------+----------------+   SSV Pop Fossa no                            .41                        +--------------+---------+------+-----------+------------+----------------+   SSV prox calf no                            .33                        +--------------+---------+------+-----------+------------+----------------+   SSV mid calf  no                            .25                         MEDICAL ISSUES:   Leg swelling: The patient does not have significant venous reflux on either leg to explain his symptoms.  However, he was found to have a ruptured Baker's cyst on the right and a 3 cm cyst on the left which could explain some of his symptoms.  He has seen orthopedics in the past for these and conservative treatment was indicated.  He knows to contact them should he have a change in symptoms.  There is nothing to do from a vascular standpoint.  He can continue to wear his compression stockings as needed.    Marti Slates, MD, FACS [facility] [phone] Pager [phone]

## 2023-08-16 ENCOUNTER — Other Ambulatory Visit: Payer: Self-pay | Admitting: Family Medicine

## 2023-08-20 DIAGNOSIS — H35412 Lattice degeneration of retina, left eye: Secondary | ICD-10-CM | POA: Diagnosis not present

## 2023-09-02 ENCOUNTER — Other Ambulatory Visit: Payer: Self-pay | Admitting: Family Medicine

## 2023-09-02 DIAGNOSIS — I10 Essential (primary) hypertension: Secondary | ICD-10-CM

## 2023-09-05 ENCOUNTER — Other Ambulatory Visit: Payer: Self-pay | Admitting: Internal Medicine

## 2023-09-05 DIAGNOSIS — D473 Essential (hemorrhagic) thrombocythemia: Secondary | ICD-10-CM

## 2023-10-28 ENCOUNTER — Encounter: Payer: Self-pay | Admitting: Physician Assistant

## 2023-10-28 ENCOUNTER — Ambulatory Visit: Payer: Medicare (Managed Care) | Admitting: Physician Assistant

## 2023-10-28 DIAGNOSIS — L219 Seborrheic dermatitis, unspecified: Secondary | ICD-10-CM

## 2023-10-28 DIAGNOSIS — L739 Follicular disorder, unspecified: Secondary | ICD-10-CM

## 2023-10-28 DIAGNOSIS — D492 Neoplasm of unspecified behavior of bone, soft tissue, and skin: Secondary | ICD-10-CM

## 2023-10-28 DIAGNOSIS — D489 Neoplasm of uncertain behavior, unspecified: Secondary | ICD-10-CM

## 2023-10-28 DIAGNOSIS — H61002 Unspecified perichondritis of left external ear: Secondary | ICD-10-CM | POA: Diagnosis not present

## 2023-10-28 DIAGNOSIS — L570 Actinic keratosis: Secondary | ICD-10-CM

## 2023-10-28 DIAGNOSIS — L821 Other seborrheic keratosis: Secondary | ICD-10-CM | POA: Diagnosis not present

## 2023-10-28 MED ORDER — HYDROCORTISONE 2.5 % EX CREA
TOPICAL_CREAM | Freq: Two times a day (BID) | CUTANEOUS | 11 refills | Status: AC | PRN
Start: 2023-10-28 — End: ?

## 2023-10-28 MED ORDER — KETOCONAZOLE 2 % EX SHAM: 1.0000 | MEDICATED_SHAMPOO | Freq: Once | CUTANEOUS | 0 refills | Status: DC

## 2023-10-28 NOTE — Progress Notes (Signed)
 New Patient Visit   Subjective  Dennis Martin is a 68 y.o. male who presents for the following: Rash  Recurring rash on the scalp. Has been a problem for many years. He has not used any topical treatments but used Nioxin shampoo. Denies itchiness.   Other concerns: sore are on left ear, rash in beard, scaly area on right posterior thigh.   The following portions of the chart were reviewed this encounter and updated as appropriate: medications, allergies, medical history  Review of Systems:  No other skin or systemic complaints except as noted in HPI or Assessment and Plan.  Objective  Well appearing patient in no apparent distress; mood and affect are within normal limits.  A focused examination was performed of the following areas: Scalp, face, ears, beard, neck, legs   Relevant exam findings are noted in the Assessment and Plan.     Left Superior Helix 8 mm Pink papule  Left Buccal Cheek, Scalp Erythematous thin papules/macules with gritty scale.   Assessment & Plan   NEOPLASM OF UNCERTAIN BEHAVIOR Left Superior Helix Skin / nail biopsy Type of biopsy: tangential   Informed consent: discussed and consent obtained   Patient was prepped and draped in usual sterile fashion: Area prepped with alcohol. Anesthesia: the lesion was anesthetized in a standard fashion   Anesthetic:  1% lidocaine  w/ epinephrine  1-100,000 buffered w/ 8.4% NaHCO3 Instrument used: flexible razor blade   Hemostasis achieved with: pressure, aluminum chloride and electrodesiccation   Outcome: patient tolerated procedure well   Post-procedure details: wound care instructions given   Post-procedure details comment:  Ointment and small bandage applied  Specimen 1 - Surgical pathology Differential Diagnosis: BCC vs SCC vs CNH  Check Margins: No AK (ACTINIC KERATOSIS) (2) Left Buccal Cheek, Scalp Destruction of lesion - Left Buccal Cheek, Scalp Complexity: simple   Destruction method: cryotherapy    Informed consent: discussed and consent obtained   Timeout:  patient name, date of birth, surgical site, and procedure verified Lesion destroyed using liquid nitrogen: Yes   Region frozen until ice ball extended beyond lesion: Yes   Outcome: patient tolerated procedure well with no complications   Post-procedure details: wound care instructions given    FOLLICULITIS   SEBORRHEIC KERATOSIS   SEBORRHEIC DERMATITIS    SEBORRHEIC KERATOSIS  - reassurance benign     SCALP FOLLICULITIS Exam: Perifollicular erythematous papules and pustules  Folliculitis occurs due to inflammation of the superficial hair follicle (pore), resulting in acne-like lesions (pus bumps). It can be infectious (bacterial, fungal) or noninfectious (shaving, tight clothing, heat/sweat, medications).  Folliculitis can be acute or chronic and recommended treatment depends on the underlying cause of folliculitis.  Treatment Plan: -Recommend START Panoxyl 10% wash on the scalp  SEBORRHEIC DERMATITIS - BEARD  Exam: Pink patches with greasy scale  flared  Seborrheic Dermatitis is a chronic persistent rash characterized by pinkness and scaling most commonly of the mid face but also can occur on the scalp (dandruff), ears; mid chest, mid back and groin.  It tends to be exacerbated by stress and cooler weather.  People who have neurologic disease may experience new onset or exacerbation of existing seborrheic dermatitis.  The condition is not curable but treatable and can be controlled.  Treatment Plan: -Hydrocortisone  2.5% as directed.  -Nizoral  shampoo 2% as directed.   Return in about 3 months (around 01/28/2024) for folliculitis f/u.  I, Gordan Beams, CMA, am acting as scribe for Keir Foland K, PA-C.   Documentation: I have  reviewed the above documentation for accuracy and completeness, and I agree with the above.  [name] K, PA-C

## 2023-10-31 LAB — SURGICAL PATHOLOGY

## 2023-11-03 ENCOUNTER — Ambulatory Visit: Payer: Self-pay | Admitting: Physician Assistant

## 2023-11-11 ENCOUNTER — Other Ambulatory Visit: Payer: Self-pay | Admitting: Family Medicine

## 2023-11-18 ENCOUNTER — Telehealth: Payer: Self-pay | Admitting: *Deleted

## 2023-11-18 DIAGNOSIS — E782 Mixed hyperlipidemia: Secondary | ICD-10-CM

## 2023-11-18 DIAGNOSIS — Z125 Encounter for screening for malignant neoplasm of prostate: Secondary | ICD-10-CM

## 2023-11-18 NOTE — Telephone Encounter (Signed)
-----   Message from Harlene Du sent at 11/18/2023  2:19 PM EDT ----- Regarding: lab Thurs 12/04/23 Hello,  Patient is coming in for CPE labs on 12/04/23. Can we get orders please.   Thanks

## 2023-11-19 ENCOUNTER — Encounter: Payer: Self-pay | Admitting: Physician Assistant

## 2023-11-28 ENCOUNTER — Other Ambulatory Visit: Payer: Self-pay | Admitting: Family Medicine

## 2023-11-28 DIAGNOSIS — I10 Essential (primary) hypertension: Secondary | ICD-10-CM

## 2023-12-02 ENCOUNTER — Other Ambulatory Visit: Payer: Self-pay | Admitting: Internal Medicine

## 2023-12-02 DIAGNOSIS — D473 Essential (hemorrhagic) thrombocythemia: Secondary | ICD-10-CM

## 2023-12-03 ENCOUNTER — Telehealth: Payer: Self-pay | Admitting: Internal Medicine

## 2023-12-03 NOTE — Telephone Encounter (Signed)
 I have re-scheduled Al for his appointment due too Dr. [name] being on call.

## 2023-12-04 ENCOUNTER — Other Ambulatory Visit (INDEPENDENT_AMBULATORY_CARE_PROVIDER_SITE_OTHER): Payer: Medicare HMO

## 2023-12-04 ENCOUNTER — Ambulatory Visit: Payer: Self-pay | Admitting: Family Medicine

## 2023-12-04 DIAGNOSIS — Z125 Encounter for screening for malignant neoplasm of prostate: Secondary | ICD-10-CM | POA: Diagnosis not present

## 2023-12-04 DIAGNOSIS — E782 Mixed hyperlipidemia: Secondary | ICD-10-CM

## 2023-12-04 LAB — COMPREHENSIVE METABOLIC PANEL WITH GFR
ALT: 14 U/L (ref 0–53)
AST: 19 U/L (ref 0–37)
Albumin: 4.2 g/dL (ref 3.5–5.2)
Alkaline Phosphatase: 68 U/L (ref 39–117)
BUN: 17 mg/dL (ref 6–23)
CO2: 28 meq/L (ref 19–32)
Calcium: 9.3 mg/dL (ref 8.4–10.5)
Chloride: 102 meq/L (ref 96–112)
Creatinine, Ser: 1.06 mg/dL (ref 0.40–1.50)
GFR: 73.19 mL/min (ref >60.00)
Glucose, Bld: 91 mg/dL (ref 70–99)
Potassium: 4.5 meq/L (ref 3.5–5.1)
Sodium: 139 meq/L (ref 135–145)
Total Bilirubin: 0.7 mg/dL (ref 0.2–1.2)
Total Protein: 5.8 g/dL — ABNORMAL LOW (ref 6.0–8.3)

## 2023-12-04 LAB — LIPID PANEL
Cholesterol: 113 mg/dL (ref 0–200)
HDL: 47.8 mg/dL (ref >39.00)
LDL Cholesterol: 49 mg/dL (ref 0–99)
NonHDL: 65.37
Total CHOL/HDL Ratio: 2
Triglycerides: 83 mg/dL (ref 0.0–149.0)
VLDL: 16.6 mg/dL (ref 0.0–40.0)

## 2023-12-04 LAB — PSA, MEDICARE: PSA: 0.78 ng/mL (ref 0.10–4.00)

## 2023-12-04 NOTE — Progress Notes (Signed)
 No critical labs need to be addressed urgently. We will discuss labs in detail at upcoming office visit.

## 2023-12-08 ENCOUNTER — Other Ambulatory Visit: Payer: Self-pay | Admitting: Family Medicine

## 2023-12-11 ENCOUNTER — Ambulatory Visit (INDEPENDENT_AMBULATORY_CARE_PROVIDER_SITE_OTHER): Payer: Medicare HMO | Admitting: Family Medicine

## 2023-12-11 ENCOUNTER — Encounter: Payer: Self-pay | Admitting: Family Medicine

## 2023-12-11 VITALS — BP 124/66 | HR 79 | Temp 97.8°F | Ht 70.0 in | Wt 177.4 lb

## 2023-12-11 DIAGNOSIS — D473 Essential (hemorrhagic) thrombocythemia: Secondary | ICD-10-CM

## 2023-12-11 DIAGNOSIS — Z1211 Encounter for screening for malignant neoplasm of colon: Secondary | ICD-10-CM

## 2023-12-11 DIAGNOSIS — Z0000 Encounter for general adult medical examination without abnormal findings: Secondary | ICD-10-CM

## 2023-12-11 DIAGNOSIS — I10 Essential (primary) hypertension: Secondary | ICD-10-CM | POA: Diagnosis not present

## 2023-12-11 DIAGNOSIS — E782 Mixed hyperlipidemia: Secondary | ICD-10-CM

## 2023-12-11 DIAGNOSIS — Z136 Encounter for screening for cardiovascular disorders: Secondary | ICD-10-CM

## 2023-12-11 DIAGNOSIS — F321 Major depressive disorder, single episode, moderate: Secondary | ICD-10-CM | POA: Diagnosis not present

## 2023-12-11 MED ORDER — COVID-19 MRNA VAC-TRIS(PFIZER) 30 MCG/0.3ML IM SUSY: 0.3000 mL | PREFILLED_SYRINGE | Freq: Once | INTRAMUSCULAR | 0 refills | Status: AC

## 2023-12-11 NOTE — Assessment & Plan Note (Signed)
Chronic, well-controlled on atorvastatin 10 mg p.o. daily

## 2023-12-11 NOTE — Assessment & Plan Note (Signed)
Chronic, diagnosed in 2006 Followed by hematology Dr. Shirline Frees CURRENT THERAPY:  1) Hydroxyurea 500 mg by mouth daily except Friday he is on 1000 mg 2) phlebotomy on as-needed basis.

## 2023-12-11 NOTE — Assessment & Plan Note (Signed)
 Chronic,  moderately  controlled.     Fluoxetine  20 mg daily

## 2023-12-11 NOTE — Assessment & Plan Note (Signed)
Stable, chronic.  Continue current medication.  Lisinopril 10 mg p.o. daily

## 2023-12-11 NOTE — Progress Notes (Signed)
 Patient ID: Dennis Martin, male    DOB: Jan 07, 1956, 68 y.o.   MRN: [id number]  This visit was conducted in person.  BP 124/66   Pulse 79   Temp 97.8 F (36.6 C) (Oral)   Ht 5' 10 (1.778 m)   Wt 177 lb 6 oz (80.5 kg)   SpO2 95%   BMI 25.45 kg/m    CC:  Chief Complaint  Patient presents with   Annual Exam    Subjective:   HPI: Dennis Martin is a 68 y.o. male presenting on 12/11/2023 for Annual Exam  The patient presents for    AMW, complete physical and review of chronic health problems. He/She also has the following acute concerns today: none  I have personally reviewed the Medicare Annual Wellness questionnaire and have noted 1. The patient's medical and social history 2. Their use of alcohol, tobacco or illicit drugs 3. Their current medications and supplements 4. The patient's functional ability including ADL's, fall risks, home safety risks and hearing or visual             impairment. 5. Diet and physical activities 6. Evidence for depression or mood disorders 7.         Updated provider list Cognitive evaluation was performed and recorded on pt medicare questionnaire form. The patients weight, height, BMI and visual acuity have been recorded in the chart   I have made referrals, counseling and provided education to the patient based review of the above and I have provided the pt with a written personalized care plan for preventive services.   Documentation of this information was scanned into the electronic record under the media tab.  Vision Screening - Comments:: Last eye exam within past yr.     No falls in last 12 months.   Flowsheet Row Office Visit from 01/08/2023 in [facility]  PHQ-2 Total Score 0    Advance directives and end of life planning reviewed in detail with patient and documented in EMR. Patient given handout on advance care directives if needed. HCPOA and living will updated if needed.   Continues to be  caregiver for his wife. She is doing better overall,  using CBD oil. He reports some increased in anxiety but less depression compared to last office visit. On fluoxetine  20 mg daily   Daughter  now helping out more... has more support so not feeling as overwhelmed.     01/08/2023   10:59 AM 12/10/2022   10:49 AM 09/06/2022    2:48 PM  Depression screen PHQ 2/9  Decreased Interest 0 0 2  Down, Depressed, Hopeless 0 0 2  PHQ - 2 Score 0 0 4  Altered sleeping 1 3 3   Tired, decreased energy 1 2 1   Change in appetite 0 0 0  Feeling bad or failure about yourself  0 0 1  Trouble concentrating 0 0 0  Moving slowly or fidgety/restless 0 1 0  Suicidal thoughts 0 0 1  PHQ-9 Score 2 6 10   Difficult doing work/chores Not difficult at all Somewhat difficult Somewhat difficult       01/08/2023   10:59 AM 09/06/2022    2:49 PM 08/06/2022   10:31 AM  GAD 7 : Generalized Anxiety Score  Nervous, Anxious, on Edge 0 2 0  Control/stop worrying 0 1 0  Worry too much - different things 0 2 1  Trouble relaxing 1 2 2   Restless 0 0 0  Easily  annoyed or irritable 1 2 1   Afraid - awful might happen 0 1 0  Total GAD 7 Score 2 10 4   Anxiety Difficulty Not difficult at all Somewhat difficult Somewhat difficult     Essential thrombocytopenia diagnosed in 2006, androgen induced polycythemia: Followed by Dr. Gatha.  Reviewed last office visit June 26, 2022  Low testosterone : On testosterone  supplementation..  Followed by urology.    Hypertension:  Well-controlled on lisinopril  10 mg p.o. daily BP Readings from Last 3 Encounters:  12/11/23 124/66  07/21/23 132/75  06/24/23 133/63  Using medication without problems or lightheadedness:  no SE Chest pain with exertion: none Edema: none Short of breath: none Average home BPs: Other issues:  Elevated Cholesterol: LDL at goal at last check on atorvastatin  10 mg daily.  NO SE. Lab Results  Component Value Date   CHOL 113 12/04/2023   HDL 47.80  12/04/2023   LDLCALC 49 12/04/2023   TRIG 83.0 12/04/2023   CHOLHDL 2 12/04/2023    Staying active.  Looking  into getting more active.  Sleep apnea followed by pulmonary on CPAP.     Relevant past medical, surgical, family and social history reviewed and updated as indicated. Interim medical history since our last visit reviewed. Allergies and medications reviewed and updated. Outpatient Medications Prior to Visit  Medication Sig Dispense Refill   aspirin  EC 81 MG tablet Take 81 mg by mouth 2 (two) times daily.     atorvastatin  (LIPITOR) 10 MG tablet Take 1 tablet by mouth once daily 90 tablet 0   Cholecalciferol (VITAMIN D3) 50 MCG (2000 UT) TABS Take 50 mcg by mouth daily at 12 noon.     FLUoxetine  (PROZAC ) 20 MG capsule Take 1 capsule by mouth once daily 30 capsule 0   hydrocortisone  2.5 % cream Apply topically 2 (two) times daily as needed (Rash). 30 g 11   hydroxyurea  (HYDREA ) 500 MG capsule TAKE 1 CAPSULE BY MOUTH ONCE DAILY FOR  6  DAYS  AND  2  CAPSULES  ON  FRIDAYS 105 capsule 0   lisinopril  (ZESTRIL ) 10 MG tablet Take 1 tablet by mouth once daily 90 tablet 0   Multiple Vitamins-Minerals (ONE-A-DAY MENS 50+) TABS Take 1 tablet by mouth daily.     NON FORMULARY CPAP-Setting 15     NON FORMULARY Place under the tongue in the morning, at noon, and at bedtime. .33 ml CBD Full Spectrum     tamsulosin  (FLOMAX ) 0.4 MG CAPS capsule 0.4 mg. 2 tablets a day at bedtime     Testosterone  Cypionate 200 MG/ML SOLN Inject 0.5 mLs into the muscle every 14 (fourteen) days.     No facility-administered medications prior to visit.     Per HPI unless specifically indicated in ROS section below Review of Systems  Constitutional:  Negative for fatigue and fever.  HENT:  Negative for ear pain.   Eyes:  Negative for pain.  Respiratory:  Negative for cough and shortness of breath.   Cardiovascular:  Negative for chest pain, palpitations and leg swelling.  Gastrointestinal:  Negative for  abdominal pain.  Genitourinary:  Negative for dysuria.  Musculoskeletal:  Negative for arthralgias.  Neurological:  Negative for syncope, light-headedness and headaches.  Psychiatric/Behavioral:  Negative for dysphoric mood.    Objective:  BP 124/66   Pulse 79   Temp 97.8 F (36.6 C) (Oral)   Ht 5' 10 (1.778 m)   Wt 177 lb 6 oz (80.5 kg)   SpO2 95%  BMI 25.45 kg/m   Wt Readings from Last 3 Encounters:  12/11/23 177 lb 6 oz (80.5 kg)  07/21/23 172 lb (78 kg)  06/24/23 173 lb 6.4 oz (78.7 kg)         Physical Exam Constitutional:      Appearance: He is well-developed.  HENT:     Head: Normocephalic.     Right Ear: Hearing normal.     Left Ear: Hearing normal.     Nose: Nose normal.  Neck:     Thyroid : No thyroid  mass or thyromegaly.     Vascular: No carotid bruit.     Trachea: Trachea normal.  Cardiovascular:     Rate and Rhythm: Normal rate and regular rhythm.     Pulses: Normal pulses.     Heart sounds: Heart sounds not distant. No murmur heard.    No friction rub. No gallop.     Comments: No peripheral edema Pulmonary:     Effort: Pulmonary effort is normal. No respiratory distress.     Breath sounds: Normal breath sounds.  Skin:    General: Skin is warm and dry.     Findings: No rash.  Psychiatric:        Speech: Speech normal.        Behavior: Behavior normal.        Thought Content: Thought content normal.  Results for orders placed or performed in visit on 12/04/23  PSA, Medicare   Collection Time: 12/04/23 11:05 AM  Result Value Ref Range   PSA 0.78 0.10 - 4.00 ng/ml  Comprehensive metabolic panel   Collection Time: 12/04/23 11:05 AM  Result Value Ref Range   Sodium 139 135 - 145 mEq/L   Potassium 4.5 3.5 - 5.1 mEq/L   Chloride 102 96 - 112 mEq/L   CO2 28 19 - 32 mEq/L   Glucose, Bld 91 70 - 99 mg/dL   BUN 17 6 - 23 mg/dL   Creatinine, Ser 8.93 0.40 - 1.50 mg/dL   Total Bilirubin 0.7 0.2 - 1.2 mg/dL   Alkaline Phosphatase 68 39 - 117 U/L    AST 19 0 - 37 U/L   ALT 14 0 - 53 U/L   Total Protein 5.8 (L) 6.0 - 8.3 g/dL   Albumin 4.2 3.5 - 5.2 g/dL   GFR 26.80 >39.99 mL/min   Calcium  9.3 8.4 - 10.5 mg/dL  Lipid panel   Collection Time: 12/04/23 11:05 AM  Result Value Ref Range   Cholesterol 113 0 - 200 mg/dL   Triglycerides 16.9 0.0 - 149.0 mg/dL   HDL 52.19 >60.99 mg/dL   VLDL 83.3 0.0 - 59.9 mg/dL   LDL Cholesterol 49 0 - 99 mg/dL   Total CHOL/HDL Ratio 2    NonHDL 65.37     Assessment and Plan The patient's preventative maintenance and recommended screening tests for an annual wellness exam were reviewed in full today. Brought up to date unless services declined.  Counselled on the importance of diet, exercise, and its role in overall health and mortality. The patient's FH and SH was reviewed, including their home life, tobacco status, and drug and alcohol status.    Vaccines: Tdap up-to-date, COVID-vaccine up-to-date, shingles vaccine series completed pneumonia series completed, flu vaccine  Prostate Cancer Screen: Lab Results  Component Value Date   PSA 0.78 12/04/2023   PSA 0.83 12/04/2022   PSA 0.33 12/06/2021  Colon Cancer Screen:  start Cologuard       Smoking Status: Former ETOH/  drug use: None/none  Hep C: Done  Medicare annual wellness visit, initial  Essential thrombocytosis (HCC) Assessment & Plan: Chronic, diagnosed in 2006 Followed by hematology Dr. Gatha CURRENT THERAPY:  1) Hydroxyurea  500 mg by mouth daily except Friday he is on 1000 mg 2) phlebotomy on as-needed basis.   Primary hypertension Assessment & Plan: Stable, chronic.  Continue current medication.  Lisinopril  10 mg p.o. daily   Current moderate episode of major depressive disorder without prior episode [facility]) Assessment & Plan: Chronic,  moderately  controlled.     Fluoxetine  20 mg daily   Mixed hyperlipidemia Assessment & Plan: Chronic, well-controlled on atorvastatin  10 mg p.o. daily   Colon cancer  screening -     Cologuard  Screening for AAA (abdominal aortic aneurysm) -     VAS US  AAA DUPLEX; Future  Other orders -     COVID-19 mRNA Vac-TriS(Pfizer); Inject 0.3 mLs into the muscle once for 1 dose.  Dispense: 0.3 mL; Refill: 0      Return in about 1 year (around 12/10/2024).   Greig Ring, MD

## 2023-12-18 NOTE — Progress Notes (Signed)
 [facility] OFFICE PROGRESS NOTE  [name], MD [address] [place] [address]  DIAGNOSIS:  1) Essential thrombocythemia diagnosed in 2006  2) androgen-induced polycythemia.  PRIOR THERAPY: None   CURRENT THERAPY:  1) Hydroxyurea  500 mg by mouth daily except Friday he is on 1000 mg 2) phlebotomy on as-needed basis.  INTERVAL HISTORY: Dennis Martin 68 y.o. male returns to the clinic today for follow-up visit.  The patient was last seen in the clinic by Dr. [name] 6 months ago.   Of note the patient is taking testosterone  therapy 100 mg every 14 days which contributes to his androgen induced polycythemia. The patient is fair today without any concerning complaints except he has had eye surgery since he was last here for retinal detachment and tear. He reports his energy is fine.  He denies shortness of breath. He reports headaches only once every once in while that is self limiting. He denies any dizziness. He denies any epistaxis, gingival bleeding, melena, or hematochezia. Denies any abdominal pain, nausea, vomiting, diarrhea, or constipation. Denies any chest pain, cough, or hemoptysis. Denies any rashes or itching.  The patient is here today for evaluation and repeat lab work.    MEDICAL HISTORY: Past Medical History:  Diagnosis Date   Anxiety    hx of - 10 years ago   Arthritis 2001   both arthritis, wrist, hands    BPH (benign prostatic hyperplasia)    Cataract 04/05/17   beginning stage   Depression    DVT of leg (deep venous thrombosis) (HCC) 03/26/1999   post knee arthroscopy,also complicated by infection, PICC line used for long term antibiotics, was on coumadin x's 6 months   Essential thrombocytosis (HCC)    GERD (gastroesophageal reflux disease) 2007   occasional   Headache    relative to testosterone  level, it had been a problem, improved with clomid    History of stress test 03/25/2006   done in [place]  ,nuclear stress test-  told that it was normal   Hypertension 2007   on Lisinopril  10 mg   Peripheral vascular disease    Sleep apnea 1999   did surgery for the problem   Spleen enlarged    Thrombocythemia    monitored by Dr. Gatha    ALLERGIES:  is allergic to keflex  [facility] ].  MEDICATIONS:  Current Outpatient Medications  Medication Sig Dispense Refill   aspirin  EC 81 MG tablet Take 81 mg by mouth 2 (two) times daily.     atorvastatin  (LIPITOR) 10 MG tablet Take 1 tablet by mouth once daily 90 tablet 0   Cholecalciferol (VITAMIN D3) 50 MCG (2000 UT) TABS Take 50 mcg by mouth daily at 12 noon.     FLUoxetine  (PROZAC ) 20 MG capsule Take 1 capsule by mouth once daily 30 capsule 0   hydrocortisone  2.5 % cream Apply topically 2 (two) times daily as needed (Rash). 30 g 11   hydroxyurea  (HYDREA ) 500 MG capsule TAKE 1 CAPSULE BY MOUTH ONCE DAILY FOR 6 DAYS AND  2 CAPSULES  ON  FRIDAYS 105 capsule 2   lisinopril  (ZESTRIL ) 10 MG tablet Take 1 tablet by mouth once daily 90 tablet 0   Multiple Vitamins-Minerals (ONE-A-DAY MENS 50+) TABS Take 1 tablet by mouth daily.     NON FORMULARY CPAP-Setting 15     NON FORMULARY Place under the tongue in the morning, at noon, and at bedtime. .33 ml CBD Full Spectrum     tamsulosin  (  FLOMAX ) 0.4 MG CAPS capsule 0.4 mg. 2 tablets a day at bedtime     Testosterone  Cypionate 200 MG/ML SOLN Inject 0.5 mLs into the muscle every 14 (fourteen) days.     No current facility-administered medications for this visit.    SURGICAL HISTORY:  Past Surgical History:  Procedure Laterality Date   EYE SURGERY     chalazion on both eyelids   JOINT REPLACEMENT  05/12/15,  07/17/15   right knee, left knee   KNEE SURGERY Bilateral 2001   NASAL SINUS SURGERY     right shoulder surgery     TONSILLECTOMY     TOTAL KNEE ARTHROPLASTY Right 05/22/2015   TOTAL KNEE ARTHROPLASTY Right 05/22/2015   Procedure: TOTAL KNEE ARTHROPLASTY;  Surgeon: Dempsey Sensor, MD;  Location: [facility] OR;  Service:  Orthopedics;  Laterality: Right;   TOTAL KNEE ARTHROPLASTY Left 07/17/2015   TOTAL KNEE ARTHROPLASTY Left 07/17/2015   Procedure: TOTAL KNEE ARTHROPLASTY;  Surgeon: Dempsey Sensor, MD;  Location: [facility] OR;  Service: Orthopedics;  Laterality: Left;   TRIGGER FINGER RELEASE Left    ULNAR NERVE TRANSPOSITION  2007   left    REVIEW OF SYSTEMS:   Review of Systems  Constitutional: Negative for appetite change, chills, fatigue, fever and unexpected weight change.  HENT: Negative for mouth sores, nosebleeds, sore throat and trouble swallowing.   Eyes: Negative for eye problems and icterus.  Respiratory: Negative for cough, hemoptysis, shortness of breath and wheezing.   Cardiovascular: Negative for chest pain and leg swelling.  Gastrointestinal: Negative for abdominal pain, constipation, diarrhea, nausea and vomiting.  Genitourinary: Negative for bladder incontinence, difficulty urinating, dysuria, frequency and hematuria.   Musculoskeletal: Negative for back pain, gait problem, neck pain and neck stiffness.  Skin: Negative for itching and rash.  Neurological: Negative for dizziness, extremity weakness, gait problem, headaches, light-headedness and seizures.  Hematological: Negative for adenopathy. Does not bruise/bleed easily.  Psychiatric/Behavioral: Negative for confusion, depression and sleep disturbance. The patient is not nervous/anxious.     PHYSICAL EXAMINATION:  Blood pressure 122/67, pulse 66, temperature 97.6 F (36.4 C), temperature source Temporal, resp. rate 13, weight 179 lb 6.4 oz (81.4 kg), SpO2 95%.  ECOG PERFORMANCE STATUS: 0  Physical Exam  Constitutional: Oriented to person, place, and time and well-developed, well-nourished, and in no distress.  HENT:  Head: Normocephalic and atraumatic.  Mouth/Throat: Oropharynx is clear and moist. No oropharyngeal exudate.  Eyes: Conjunctivae are normal. Right eye exhibits no discharge. Left eye exhibits no discharge. No scleral  icterus.  Neck: Normal range of motion. Neck supple.  Cardiovascular: Normal rate, regular rhythm, normal heart sounds and intact distal pulses.   Pulmonary/Chest: Effort normal and breath sounds normal. No respiratory distress. No wheezes. No rales.  Abdominal: Soft. Bowel sounds are normal. Exhibits no distension and no mass. There is no tenderness.  Musculoskeletal: Normal range of motion. Exhibits no edema.  Lymphadenopathy:    No cervical adenopathy.  Neurological: Alert and oriented to person, place, and time. Exhibits normal muscle tone. Gait normal. Coordination normal.  Skin: Skin is warm and dry. No rash noted. Not diaphoretic. No erythema. No pallor.  Psychiatric: Mood, memory and judgment normal.  Vitals reviewed.  LABORATORY DATA: Lab Results  Component Value Date   WBC 9.2 12/24/2023   HGB 14.5 12/24/2023   HCT 45.2 12/24/2023   MCV 91.9 12/24/2023   PLT 416 (H) 12/24/2023      Chemistry      Component Value Date/Time   NA 137  12/24/2023 1427   NA 139 11/10/2013 1508   K 4.2 12/24/2023 1427   K 4.3 11/10/2013 1508   CL 104 12/24/2023 1427   CL 102 05/04/2012 1309   CO2 29 12/24/2023 1427   CO2 28 11/10/2013 1508   BUN 20 12/24/2023 1427   BUN 15.4 11/10/2013 1508   CREATININE 1.17 12/24/2023 1427   CREATININE 1.1 11/10/2013 1508      Component Value Date/Time   CALCIUM  8.7 (L) 12/24/2023 1427   CALCIUM  9.6 11/10/2013 1508   ALKPHOS 68 12/24/2023 1427   ALKPHOS 78 11/10/2013 1508   AST 23 12/24/2023 1427   AST 19 11/10/2013 1508   ALT 15 12/24/2023 1427   ALT 15 11/10/2013 1508   BILITOT 0.8 12/24/2023 1427   BILITOT 0.43 11/10/2013 1508       RADIOGRAPHIC STUDIES:  No results found.   ASSESSMENT/PLAN:  This is a very pleasant 68 year old Caucasian male diagnosed with essential thrombocythemia in 2006.  He is currently undergoing treatment with 500 mg p.o. daily of hydroxyurea  except on Fridays where he takes 1000 mg.   The patient also is  taking testosterone  which is contributing to his polycythemia.   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   Labs were reviewed.  The patient's hemoglobin today is ** today. His hematocrit is 45.2% His platelets are slightly elevated 416. No phlebotomy is needed. His Hbg and hematocrit have been well managed.   He will continue the hydrea  500 mg daily and 1000 mg on Fridays.    We will see him back for labs and a follow-up in 6 months.  The patient was advised to call immediately if he has any concerning symptoms in the interval. The patient voices understanding of current disease status and treatment options and is in agreement with the current care plan. All questions were answered. The patient knows to call the clinic with any problems, questions or concerns. We can certainly see the patient much sooner if necessary   Orders Placed This Encounter  Procedures   CBC with Differential (Cancer Center Only)    Standing Status:   Future    Expected Date:   06/23/2024    Expiration Date:   12/23/2024   CMP (Cancer Center only)    Standing Status:   Future    Expected Date:   06/23/2024    Expiration Date:   12/23/2024   Iron and Iron Binding Capacity ([facility] only)    Standing Status:   Future    Expected Date:   06/23/2024    Expiration Date:   12/23/2024   Ferritin    Standing Status:   Future    Expected Date:   06/23/2024    Expiration Date:   12/23/2024   Lactate dehydrogenase (LDH)    Standing Status:   Future    Expected Date:   06/23/2024    Expiration Date:   12/23/2024     The total time spent in the appointment was 20-29 minutes  Ezme Duch L Fardeen Steinberger, PA-C 12/24/23

## 2023-12-24 ENCOUNTER — Inpatient Hospital Stay: Admitting: Internal Medicine

## 2023-12-24 ENCOUNTER — Inpatient Hospital Stay: Admitting: Physician Assistant

## 2023-12-24 ENCOUNTER — Inpatient Hospital Stay

## 2023-12-24 ENCOUNTER — Inpatient Hospital Stay: Attending: Internal Medicine

## 2023-12-24 DIAGNOSIS — Z79899 Other long term (current) drug therapy: Secondary | ICD-10-CM | POA: Insufficient documentation

## 2023-12-24 DIAGNOSIS — D473 Essential (hemorrhagic) thrombocythemia: Secondary | ICD-10-CM | POA: Diagnosis not present

## 2023-12-24 LAB — CMP (CANCER CENTER ONLY)
ALT: 15 U/L (ref 0–44)
AST: 23 U/L (ref 15–41)
Albumin: 4.1 g/dL (ref 3.5–5.0)
Alkaline Phosphatase: 68 U/L (ref 38–126)
Anion gap: 4 — ABNORMAL LOW (ref 5–15)
BUN: 20 mg/dL (ref 8–23)
CO2: 29 mmol/L (ref 22–32)
Calcium: 8.7 mg/dL — ABNORMAL LOW (ref 8.9–10.3)
Chloride: 104 mmol/L (ref 98–111)
Creatinine: 1.17 mg/dL (ref 0.61–1.24)
GFR, Estimated: >60 mL/min (ref >60)
Glucose, Bld: 93 mg/dL (ref 70–99)
Potassium: 4.2 mmol/L (ref 3.5–5.1)
Sodium: 137 mmol/L (ref 135–145)
Total Bilirubin: 0.8 mg/dL (ref 0.0–1.2)
Total Protein: 5.8 g/dL — ABNORMAL LOW (ref 6.5–8.1)

## 2023-12-24 LAB — CBC WITH DIFFERENTIAL (CANCER CENTER ONLY)
Abs Immature Granulocytes: 0.05 K/uL (ref 0.00–0.07)
Basophils Absolute: 0.1 K/uL (ref 0.0–0.1)
Basophils Relative: 1 %
Eosinophils Absolute: 0.2 K/uL (ref 0.0–0.5)
Eosinophils Relative: 2 %
HCT: 45.2 % (ref 39.0–52.0)
Hemoglobin: 14.5 g/dL (ref 13.0–17.0)
Immature Granulocytes: 1 %
Lymphocytes Relative: 11 %
Lymphs Abs: 1 K/uL (ref 0.7–4.0)
MCH: 29.5 pg (ref 26.0–34.0)
MCHC: 32.1 g/dL (ref 30.0–36.0)
MCV: 91.9 fL (ref 80.0–100.0)
Monocytes Absolute: 0.8 K/uL (ref 0.1–1.0)
Monocytes Relative: 9 %
Neutro Abs: 7 K/uL (ref 1.7–7.7)
Neutrophils Relative %: 76 %
Platelet Count: 416 K/uL — ABNORMAL HIGH (ref 150–400)
RBC: 4.92 MIL/uL (ref 4.22–5.81)
RDW: 15.4 % (ref 11.5–15.5)
WBC Count: 9.2 K/uL (ref 4.0–10.5)
nRBC: 0 % (ref 0.0–0.2)

## 2023-12-24 LAB — IRON AND IRON BINDING CAPACITY (CC-WL,HP ONLY)
Iron: 46 ug/dL (ref 45–182)
Saturation Ratios: 10 % — ABNORMAL LOW (ref 17.9–39.5)
TIBC: 445 ug/dL (ref 250–450)
UIBC: 399 ug/dL — ABNORMAL HIGH (ref 117–376)

## 2023-12-24 LAB — LACTATE DEHYDROGENASE: LDH: 177 U/L (ref 98–192)

## 2023-12-24 LAB — FERRITIN: Ferritin: 22 ng/mL — ABNORMAL LOW (ref 24–336)

## 2023-12-24 MED ORDER — HYDROXYUREA 500 MG PO CAPS: ORAL_CAPSULE | ORAL | 2 refills | Status: AC

## 2023-12-26 ENCOUNTER — Encounter (HOSPITAL_COMMUNITY)

## 2023-12-29 DIAGNOSIS — Z1211 Encounter for screening for malignant neoplasm of colon: Secondary | ICD-10-CM | POA: Diagnosis not present

## 2024-01-04 ENCOUNTER — Other Ambulatory Visit: Payer: Self-pay | Admitting: Family Medicine

## 2024-01-05 LAB — COLOGUARD: COLOGUARD: POSITIVE — AB

## 2024-01-06 ENCOUNTER — Ambulatory Visit: Payer: Self-pay | Admitting: Family Medicine

## 2024-01-07 ENCOUNTER — Other Ambulatory Visit: Payer: Self-pay | Admitting: Family Medicine

## 2024-01-07 DIAGNOSIS — R195 Other fecal abnormalities: Secondary | ICD-10-CM

## 2024-01-09 ENCOUNTER — Other Ambulatory Visit (HOSPITAL_COMMUNITY)

## 2024-01-15 ENCOUNTER — Encounter: Payer: Self-pay | Admitting: Family Medicine

## 2024-01-15 DIAGNOSIS — M1811 Unilateral primary osteoarthritis of first carpometacarpal joint, right hand: Secondary | ICD-10-CM | POA: Diagnosis not present

## 2024-01-15 NOTE — Telephone Encounter (Signed)
Noted. Thank you for seeing the patient.

## 2024-01-15 NOTE — Telephone Encounter (Signed)
 I spoke with pt since 01/09/24 pt has had loose stools on and off since 01/09/24; no blood or mucus seen. No N&V. No fever. Immodium and BRAT diet not helping; pt is staying hydrated. Sometimes stool can be watery but mostly loose stool. Occasional on and off dull to sharpe mid upper abd pain. No pain now and last diarrhea was 01/15/24 at 10 AM.No dry mouth noted.No appts at [facility] available on 01/15/24 or 01/16/24. pt scheduled appt at [facility] on 01/16/24 at 4PM with JAYSON Aurora NP with UC & ED precautions and pt voiced understanding.(Pt lives in [place] but pt will already be in [place] tomorrow afternoon). Sending note to JAYSON Aurora NP and FYI to Dr Avelina as PCP.

## 2024-01-16 ENCOUNTER — Encounter: Payer: Self-pay | Admitting: Nurse Practitioner

## 2024-01-16 ENCOUNTER — Other Ambulatory Visit
Admission: RE | Admit: 2024-01-16 | Discharge: 2024-01-16 | Disposition: A | Discharge status: Home / Self Care | Source: Ambulatory Visit | Attending: Nurse Practitioner | Admitting: Nurse Practitioner

## 2024-01-16 ENCOUNTER — Ambulatory Visit (INDEPENDENT_AMBULATORY_CARE_PROVIDER_SITE_OTHER): Admitting: Nurse Practitioner

## 2024-01-16 VITALS — BP 122/60 | HR 64 | Temp 97.8°F | Ht 70.0 in | Wt 174.6 lb

## 2024-01-16 DIAGNOSIS — D473 Essential (hemorrhagic) thrombocythemia: Secondary | ICD-10-CM | POA: Insufficient documentation

## 2024-01-16 DIAGNOSIS — R197 Diarrhea, unspecified: Secondary | ICD-10-CM | POA: Diagnosis not present

## 2024-01-16 LAB — IRON AND TIBC
Iron: 22 ug/dL — ABNORMAL LOW (ref 45–182)
Saturation Ratios: 5 % — ABNORMAL LOW (ref 17.9–39.5)
TIBC: 447 ug/dL (ref 250–450)
UIBC: 425 ug/dL

## 2024-01-16 LAB — CBC WITH DIFFERENTIAL (CANCER CENTER ONLY)
Abs Immature Granulocytes: 0.2 K/uL — ABNORMAL HIGH (ref 0.00–0.07)
Basophils Absolute: 0.1 K/uL (ref 0.0–0.1)
Basophils Relative: 0 %
Eosinophils Absolute: 0 K/uL (ref 0.0–0.5)
Eosinophils Relative: 0 %
HCT: 47.8 % (ref 39.0–52.0)
Hemoglobin: 15.4 g/dL (ref 13.0–17.0)
Immature Granulocytes: 1 %
Lymphocytes Relative: 5 %
Lymphs Abs: 0.8 K/uL (ref 0.7–4.0)
MCH: 30.1 pg (ref 26.0–34.0)
MCHC: 32.2 g/dL (ref 30.0–36.0)
MCV: 93.4 fL (ref 80.0–100.0)
Monocytes Absolute: 1 K/uL (ref 0.1–1.0)
Monocytes Relative: 6 %
Neutro Abs: 14.1 K/uL — ABNORMAL HIGH (ref 1.7–7.7)
Neutrophils Relative %: 88 %
Platelet Count: 466 K/uL — ABNORMAL HIGH (ref 150–400)
RBC: 5.12 MIL/uL (ref 4.22–5.81)
RDW: 15.9 % — ABNORMAL HIGH (ref 11.5–15.5)
WBC Count: 16.3 K/uL — ABNORMAL HIGH (ref 4.0–10.5)
nRBC: 0 % (ref 0.0–0.2)

## 2024-01-16 LAB — GASTROINTESTINAL PANEL BY PCR, STOOL (REPLACES STOOL CULTURE)

## 2024-01-16 LAB — CMP (CANCER CENTER ONLY)
ALT: 17 U/L (ref 0–44)
AST: 23 U/L (ref 15–41)
Albumin: 4.2 g/dL (ref 3.5–5.0)
Alkaline Phosphatase: 60 U/L (ref 38–126)
Anion gap: 11 (ref 5–15)
BUN: 21 mg/dL (ref 8–23)
CO2: 23 mmol/L (ref 22–32)
Calcium: 8.8 mg/dL — ABNORMAL LOW (ref 8.9–10.3)
Chloride: 103 mmol/L (ref 98–111)
Creatinine: 1.02 mg/dL (ref 0.61–1.24)
GFR, Estimated: >60 mL/min (ref >60)
Glucose, Bld: 98 mg/dL (ref 70–99)
Potassium: 3.8 mmol/L (ref 3.5–5.1)
Sodium: 137 mmol/L (ref 135–145)
Total Bilirubin: 1.2 mg/dL (ref 0.0–1.2)
Total Protein: 6.5 g/dL (ref 6.5–8.1)

## 2024-01-16 LAB — FERRITIN: Ferritin: 11 ng/mL — ABNORMAL LOW (ref 24–336)

## 2024-01-16 LAB — LACTATE DEHYDROGENASE: LDH: 165 U/L (ref 98–192)

## 2024-01-16 LAB — C DIFFICILE QUICK SCREEN W PCR REFLEX
C Diff antigen: NEGATIVE
C Diff interpretation: NOT DETECTED
C Diff toxin: NEGATIVE

## 2024-01-16 NOTE — Patient Instructions (Signed)
 Please take the stool sample to  [facility] [address] [place], [place], [place] [address]

## 2024-01-16 NOTE — Progress Notes (Signed)
 Established Patient Office Visit  Subjective:  Patient ID: Dennis Martin, male    DOB: [birth year]  Age: 68 y.o. MRN: [id number]  CC:  Chief Complaint  Patient presents with   Acute Visit    Intermittent loose stools sometimes watery since last Saturday   Discussed the use of a AI scribe software for clinical note transcription with the patient, who gave verbal consent to proceed.  HPI  Dennis Martin is a 68 year old male who presents with a six-day history of intermittent diarrhea.  He experiences diarrhea with initially watery stools, unresponsive to Imodium. Despite a bland diet, stools remain semi-formed and loose. He has headaches and intermittent, non-radiating abdominal pain around the umbilical area. He denies nausea, blood in stool, vomiting, and fever. Bowel movements occur one to two times daily, sometimes triggered by eating. He maintains hydration with tea and an electrolyte and vitamin C supplement.  HPI   Past Medical History:  Diagnosis Date   Anxiety    hx of - 10 years ago   Arthritis 2001   both arthritis, wrist, hands    BPH (benign prostatic hyperplasia)    Cataract 04/05/17   beginning stage   Depression    DVT of leg (deep venous thrombosis) (HCC) 03/26/1999   post knee arthroscopy,also complicated by infection, PICC line used for long term antibiotics, was on coumadin x's 6 months   Essential thrombocytosis (HCC)    GERD (gastroesophageal reflux disease) 2007   occasional   Headache    relative to testosterone  level, it had been a problem, improved with clomid    History of stress test 03/25/2006   done in [place]  ,nuclear stress test- told that it was normal   Hypertension 2007   on Lisinopril  10 mg   Peripheral vascular disease    Sleep apnea 1999   did surgery for the problem   Spleen enlarged    Thrombocythemia    monitored by Dr. Gatha    Past Surgical History:  Procedure Laterality Date   EYE SURGERY     chalazion on both  eyelids   JOINT REPLACEMENT  05/12/15,  07/17/15   right knee, left knee   KNEE SURGERY Bilateral 2001   NASAL SINUS SURGERY     right shoulder surgery     TONSILLECTOMY     TOTAL KNEE ARTHROPLASTY Right 05/22/2015   TOTAL KNEE ARTHROPLASTY Right 05/22/2015   Procedure: TOTAL KNEE ARTHROPLASTY;  Surgeon: Dempsey Sensor, MD;  Location: [facility] OR;  Service: Orthopedics;  Laterality: Right;   TOTAL KNEE ARTHROPLASTY Left 07/17/2015   TOTAL KNEE ARTHROPLASTY Left 07/17/2015   Procedure: TOTAL KNEE ARTHROPLASTY;  Surgeon: Dempsey Sensor, MD;  Location: [facility] OR;  Service: Orthopedics;  Laterality: Left;   TRIGGER FINGER RELEASE Left    ULNAR NERVE TRANSPOSITION  2007   left    Family History  Problem Relation Age of Onset   Heart attack Mother 75   Arthritis Mother    Heart disease Mother    Hypertension Mother    Varicose Veins Mother    Arthritis Father    Hearing loss Father    Heart disease Father    Hypertension Father    Other Brother        brain aneyurism   Colon cancer Paternal Aunt 87   Early death Brother    Alcohol abuse Brother    Arthritis Brother    Intellectual disability Brother    Learning disabilities Brother    Stomach  cancer Neg Hx     Social History   Socioeconomic History   Marital status: Married    Spouse name: Science Writer   Number of children: 2   Years of education: high school   Highest education level: Some college, no degree  Occupational History   Not on file  Tobacco Use   Smoking status: Former    Current packs/day: 0.00    Average packs/day: 1.1 packs/day for 28.3 years (30.0 ttl pk-yrs)    Types: Cigarettes    Start date: 05/05/1978    Quit date: 05/06/2003    Years since quitting: 20.7   Smokeless tobacco: Never   Tobacco comments:    Quit 2005  Vaping Use   Vaping status: Never Used  Substance and Sexual Activity   Alcohol use: Yes    Alcohol/week: 1.0 standard drink of alcohol    Types: 1 Cans of beer per week    Comment: stopped drinking  when I started antidepressant.   Drug use: No   Sexual activity: Not Currently    Birth control/protection: None  Other Topics Concern   Not on file  Social History Narrative   05/11/19   From: [place]  originally, moved to [facility] in [place]    Living: with Resa, wife since 2002   Work: data center management work      Family: 2 sons - Lonni and Franky - no grandchildren       Enjoys: target shooting      Exercise: multiple joint issues has limited   Diet: normal      Safety   Seat belts: Yes    Guns: Yes  and secure   Safe in relationships: Yes    Social Drivers of Corporate Investment Banker Strain: Low Risk  (01/15/2024)   Overall Financial Resource Strain (CARDIA)    Difficulty of Paying Living Expenses: Not hard at all  Food Insecurity: No Food Insecurity (01/15/2024)   Hunger Vital Sign    Worried About Running Out of Food in the Last Year: Never true    Ran Out of Food in the Last Year: Never true  Transportation Needs: No Transportation Needs (01/15/2024)   PRAPARE - Administrator, Civil Service (Medical): No    Lack of Transportation (Non-Medical): No  Physical Activity: Inactive (01/15/2024)   Exercise Vital Sign    Days of Exercise per Week: 0 days    Minutes of Exercise per Session: Not on file  Stress: Stress Concern Present (01/15/2024)   [employer] of Occupational Health - Occupational Stress Questionnaire    Feeling of Stress: To some extent  Social Connections: Unknown (01/15/2024)   Social Connection and Isolation Panel    Frequency of Communication with Friends and Family: Three times a week    Frequency of Social Gatherings with Friends and Family: Twice a week    Attends Religious Services: Not on Marketing Executive or Organizations: No    Attends Banker Meetings: Not on file    Marital Status: Married  Recent Concern: Social Connections - Moderately Isolated (12/07/2023)   Social Connection and  Isolation Panel    Frequency of Communication with Friends and Family: Three times a week    Frequency of Social Gatherings with Friends and Family: Never    Attends Religious Services: Never    Database Administrator or Organizations: No    Attends Banker Meetings: Not on file  Marital Status: Married  Catering Manager Violence: Not on file     Outpatient Medications Prior to Visit  Medication Sig Dispense Refill   aspirin  EC 81 MG tablet Take 81 mg by mouth 2 (two) times daily.     atorvastatin  (LIPITOR) 10 MG tablet Take 1 tablet by mouth once daily 90 tablet 0   Cholecalciferol (VITAMIN D3) 50 MCG (2000 UT) TABS Take 50 mcg by mouth daily at 12 noon.     FLUoxetine  (PROZAC ) 20 MG capsule Take 1 capsule by mouth once daily 90 capsule 1   hydrocortisone  2.5 % cream Apply topically 2 (two) times daily as needed (Rash). 30 g 11   hydroxyurea  (HYDREA ) 500 MG capsule TAKE 1 CAPSULE BY MOUTH ONCE DAILY FOR 6 DAYS AND  2 CAPSULES  ON  FRIDAYS 105 capsule 2   lisinopril  (ZESTRIL ) 10 MG tablet Take 1 tablet by mouth once daily 90 tablet 0   Multiple Vitamins-Minerals (ONE-A-DAY MENS 50+) TABS Take 1 tablet by mouth daily.     NON FORMULARY CPAP-Setting 15     NON FORMULARY Place under the tongue in the morning, at noon, and at bedtime. .33 ml CBD Full Spectrum     tamsulosin  (FLOMAX ) 0.4 MG CAPS capsule 0.4 mg. 2 tablets a day at bedtime     Testosterone  Cypionate 200 MG/ML SOLN Inject 0.5 mLs into the muscle every 14 (fourteen) days.     No facility-administered medications prior to visit.    Allergies  Allergen Reactions   Keflex  [Cephalexin ] Rash    ROS Review of Systems Negative unless indicated in HPI.    Objective:    Physical Exam Constitutional:      Appearance: Normal appearance.  HENT:     Mouth/Throat:     Mouth: Mucous membranes are moist.  Eyes:     Conjunctiva/sclera: Conjunctivae normal.     Pupils: Pupils are equal, round, and reactive to  light.  Cardiovascular:     Rate and Rhythm: Normal rate and regular rhythm.     Pulses: Normal pulses.     Heart sounds: Normal heart sounds.  Pulmonary:     Effort: Pulmonary effort is normal.     Breath sounds: Normal breath sounds.  Abdominal:     General: Bowel sounds are normal.     Palpations: Abdomen is soft.     Tenderness: There is abdominal tenderness in the periumbilical area.     Hernia: No hernia is present.  Musculoskeletal:     Cervical back: Normal range of motion. No tenderness.  Skin:    General: Skin is warm.     Findings: No bruising.  Neurological:     General: No focal deficit present.     Mental Status: He is alert and oriented to person, place, and time. Mental status is at baseline.  Psychiatric:        Mood and Affect: Mood normal.        Behavior: Behavior normal.        Thought Content: Thought content normal.        Judgment: Judgment normal.     BP 122/60   Pulse 64   Temp 97.8 F (36.6 C)   Ht 5' 10 (1.778 m)   Wt 174 lb 9.6 oz (79.2 kg)   SpO2 95%   BMI 25.05 kg/m  Wt Readings from Last 3 Encounters:  01/16/24 174 lb 9.6 oz (79.2 kg)  12/24/23 179 lb 6.4 oz (81.4 kg)  12/11/23 177  lb 6 oz (80.5 kg)     Health Maintenance  Topic Date Due   Colonoscopy  05/26/2022   COVID-19 Vaccine (9 - Pfizer risk 2025-26 season) 06/18/2024   Medicare Annual Wellness (AWV)  12/10/2024   DTaP/Tdap/Td (3 - Td or Tdap) 12/12/2032   Pneumococcal Vaccine: 50+ Years  Completed   Influenza Vaccine  Completed   Hepatitis C Screening  Completed   Zoster Vaccines- Shingrix   Completed   Meningococcal B Vaccine  Aged Out    There are no preventive care reminders to display for this patient.  Lab Results  Component Value Date   TSH 1.75 05/15/2017   Lab Results  Component Value Date   WBC 16.3 (H) 01/16/2024   HGB 15.4 01/16/2024   HCT 47.8 01/16/2024   MCV 93.4 01/16/2024   PLT 466 (H) 01/16/2024   Lab Results  Component Value Date   NA  137 01/16/2024   K 3.8 01/16/2024   CHLORIDE 103 11/10/2013   CO2 23 01/16/2024   GLUCOSE 98 01/16/2024   BUN 21 01/16/2024   CREATININE 1.02 01/16/2024   BILITOT 1.2 01/16/2024   ALKPHOS 60 01/16/2024   AST 23 01/16/2024   ALT 17 01/16/2024   PROT 6.5 01/16/2024   ALBUMIN 4.2 01/16/2024   CALCIUM  8.8 (L) 01/16/2024   ANIONGAP 11 01/16/2024   GFR 73.19 12/04/2023   Lab Results  Component Value Date   CHOL 113 12/04/2023   Lab Results  Component Value Date   HDL 47.80 12/04/2023   Lab Results  Component Value Date   LDLCALC 49 12/04/2023   Lab Results  Component Value Date   TRIG 83.0 12/04/2023   Lab Results  Component Value Date   CHOLHDL 2 12/04/2023   No results found for: HGBA1C    Assessment & Plan:  Diarrhea, unspecified type Assessment & Plan: Diarrhea for six days with intermittent periumbilical pain. Denise fever, nausea, vomiting or blood in stool.  Has been eating bland diet. -We will check for labs as outlined. -Stool kit provided and instructions to submit it at [facility] lab. -Advise against further use of Imodium. - Encourage hydration and bland diet.  Orders: -     Gastrointestinal Panel by PCR , Stool -     C Difficile Quick Screen w PCR reflex -     CBC -     Basic metabolic panel with GFR -     Hepatic function panel    Follow-up: Return if symptoms worsen or fail to improve.   Kandee Escalante, NP

## 2024-01-16 NOTE — Assessment & Plan Note (Addendum)
 Diarrhea for six days with intermittent periumbilical pain. Denise fever, nausea, vomiting or blood in stool.  Has been eating bland diet. -We will check for labs as outlined. -Stool kit provided and instructions to submit it at [facility] lab. -Advise against further use of Imodium. - Encourage hydration and bland diet.

## 2024-01-19 ENCOUNTER — Other Ambulatory Visit: Payer: Self-pay

## 2024-01-19 ENCOUNTER — Ambulatory Visit: Payer: Self-pay | Admitting: Nurse Practitioner

## 2024-01-19 DIAGNOSIS — D72829 Elevated white blood cell count, unspecified: Secondary | ICD-10-CM

## 2024-01-21 ENCOUNTER — Telehealth: Payer: Self-pay

## 2024-01-21 ENCOUNTER — Encounter (HOSPITAL_BASED_OUTPATIENT_CLINIC_OR_DEPARTMENT_OTHER): Payer: Self-pay

## 2024-01-21 NOTE — Telephone Encounter (Signed)
 Copied from CRM [id number]. Topic: Clinical - Lab/Test Results >> Jan 19, 2024 10:39 AM Deaijah H wrote: Reason for CRM: patient called in to return call to [facility]

## 2024-01-22 ENCOUNTER — Ambulatory Visit (INDEPENDENT_AMBULATORY_CARE_PROVIDER_SITE_OTHER)

## 2024-01-22 ENCOUNTER — Telehealth: Payer: Self-pay | Admitting: *Deleted

## 2024-01-22 ENCOUNTER — Other Ambulatory Visit: Payer: Self-pay | Admitting: Family Medicine

## 2024-01-22 DIAGNOSIS — I10 Essential (primary) hypertension: Secondary | ICD-10-CM

## 2024-01-22 DIAGNOSIS — D72829 Elevated white blood cell count, unspecified: Secondary | ICD-10-CM

## 2024-01-22 DIAGNOSIS — Z136 Encounter for screening for cardiovascular disorders: Secondary | ICD-10-CM

## 2024-01-22 NOTE — Telephone Encounter (Signed)
 Future order for CBC w/Diff placed in Epic.

## 2024-01-22 NOTE — Telephone Encounter (Signed)
 Looks like a CBC has been ordered by another office.  Are you okay putting in a order so lab can be drawn here?

## 2024-01-22 NOTE — Telephone Encounter (Signed)
-----   Message from Veva JINNY Ferrari sent at 01/21/2024  4:20 PM EDT ----- Regarding: Lab orders for Mon, 11.3.25 Lab orders, thanks

## 2024-01-26 ENCOUNTER — Encounter: Payer: Self-pay | Admitting: Physician Assistant

## 2024-01-26 ENCOUNTER — Ambulatory Visit: Admitting: Physician Assistant

## 2024-01-26 ENCOUNTER — Other Ambulatory Visit (INDEPENDENT_AMBULATORY_CARE_PROVIDER_SITE_OTHER)

## 2024-01-26 VITALS — BP 87/54

## 2024-01-26 DIAGNOSIS — L219 Seborrheic dermatitis, unspecified: Secondary | ICD-10-CM | POA: Diagnosis not present

## 2024-01-26 DIAGNOSIS — L570 Actinic keratosis: Secondary | ICD-10-CM | POA: Diagnosis not present

## 2024-01-26 DIAGNOSIS — Z1283 Encounter for screening for malignant neoplasm of skin: Secondary | ICD-10-CM | POA: Diagnosis not present

## 2024-01-26 DIAGNOSIS — D1801 Hemangioma of skin and subcutaneous tissue: Secondary | ICD-10-CM

## 2024-01-26 DIAGNOSIS — L578 Other skin changes due to chronic exposure to nonionizing radiation: Secondary | ICD-10-CM | POA: Diagnosis not present

## 2024-01-26 DIAGNOSIS — D229 Melanocytic nevi, unspecified: Secondary | ICD-10-CM

## 2024-01-26 DIAGNOSIS — L821 Other seborrheic keratosis: Secondary | ICD-10-CM | POA: Diagnosis not present

## 2024-01-26 DIAGNOSIS — W908XXA Exposure to other nonionizing radiation, initial encounter: Secondary | ICD-10-CM

## 2024-01-26 DIAGNOSIS — L814 Other melanin hyperpigmentation: Secondary | ICD-10-CM

## 2024-01-26 DIAGNOSIS — D72829 Elevated white blood cell count, unspecified: Secondary | ICD-10-CM

## 2024-01-26 DIAGNOSIS — L739 Follicular disorder, unspecified: Secondary | ICD-10-CM | POA: Diagnosis not present

## 2024-01-26 LAB — CBC WITH DIFFERENTIAL/PLATELET
Basophils Absolute: 0.1 K/uL (ref 0.0–0.1)
Basophils Relative: 0.8 % (ref 0.0–3.0)
Eosinophils Absolute: 0.1 K/uL (ref 0.0–0.7)
Eosinophils Relative: 0.8 % (ref 0.0–5.0)
HCT: 46.6 % (ref 39.0–52.0)
Hemoglobin: 14.9 g/dL (ref 13.0–17.0)
Lymphocytes Relative: 6.5 % — ABNORMAL LOW (ref 12.0–46.0)
Lymphs Abs: 0.6 K/uL — ABNORMAL LOW (ref 0.7–4.0)
MCHC: 32 g/dL (ref 30.0–36.0)
MCV: 94.2 fl (ref 78.0–100.0)
Monocytes Absolute: 0.7 K/uL (ref 0.1–1.0)
Monocytes Relative: 7.5 % (ref 3.0–12.0)
Neutro Abs: 7.6 K/uL (ref 1.4–7.7)
Neutrophils Relative %: 84.4 % — ABNORMAL HIGH (ref 43.0–77.0)
Platelets: 381 K/uL (ref 150.0–400.0)
RBC: 4.95 Mil/uL (ref 4.22–5.81)
RDW: 18 % — ABNORMAL HIGH (ref 11.5–15.5)
WBC: 9 K/uL (ref 4.0–10.5)

## 2024-01-26 NOTE — Progress Notes (Signed)
   Follow-Up Visit   Subjective  Dennis Martin is a 68 y.o. male who presents for the following: TBSE exam today - no history of skin cancer.   Follow up for folliculitis of scalp - He is washing with Panoxyl daily and it is improved.  He had 2 Aks of his scalp and left cheek that were treated with LN2 at his last appointment.  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  Is also here for full body skin exam as he requested.    The following portions of the chart were reviewed this encounter and updated as appropriate: medications, allergies, medical history  Review of Systems:  No other skin or systemic complaints except as noted in HPI or Assessment and Plan.  Objective  Well appearing patient in no apparent distress; mood and affect are within normal limits.  A full examination was performed including scalp, head, eyes, ears, nose, lips, neck, chest, axillae, abdomen, back, buttocks, bilateral upper extremities, bilateral lower extremities, hands, feet, fingers, toes, fingernails, and toenails. All findings within normal limits unless otherwise noted below.    Relevant exam findings are noted in the Assessment and Plan.  Scalp (5) Erythematous thin papules/macules with gritty scale.   Assessment & Plan   FOLLICULITIS Exam: Much improved of scalp  Treatment Plan: Continue Panoxyl 10 wash daily  SEBORRHEIC DERMATITIS Exam: Improved   Treatment Plan: Continue hydrocortisone  2.5% cream as directed and Nizoral  shampoo as directed.   LENTIGINES, SEBORRHEIC KERATOSES, HEMANGIOMAS - Benign normal skin lesions - Benign-appearing - Call for any changes  MELANOCYTIC NEVI - Tan-brown and/or pink-flesh-colored symmetric macules and papules - Benign appearing on exam today - Observation - Call clinic for new or changing moles - Recommend daily use of broad spectrum spf 30+ sunscreen to sun-exposed areas.   ACTINIC DAMAGE -  Chronic condition, secondary to cumulative UV/sun exposure - diffuse scaly erythematous macules with underlying dyspigmentation - Recommend daily broad spectrum sunscreen SPF 30+ to sun-exposed areas, reapply every 2 hours as needed.  - Staying in the shade or wearing long sleeves, sun glasses (UVA+UVB protection) and wide brim hats (4-inch brim around the entire circumference of the hat) are also recommended for sun protection.  - Call for new or changing lesions.  SKIN CANCER SCREENING PERFORMED TODAY  AK (ACTINIC KERATOSIS) (5) Scalp (5) Destruction of lesion - Scalp (5) Complexity: simple   Destruction method: cryotherapy   Informed consent: discussed and consent obtained   Timeout:  patient name, date of birth, surgical site, and procedure verified Lesion destroyed using liquid nitrogen: Yes   Region frozen until ice ball extended beyond lesion: Yes   Outcome: patient tolerated procedure well with no complications   Post-procedure details: wound care instructions given    FOLLICULITIS   SEBORRHEIC DERMATITIS   LENTIGINES   SEBORRHEIC KERATOSIS   CHERRY ANGIOMA   MULTIPLE BENIGN NEVI   SCREENING EXAM FOR SKIN CANCER    Return in about 6 months (around 07/25/2024) for AK follow up.  I, Roseline Hutchinson, CMA, am acting as scribe for Vernessa Likes K, PA-C .   Documentation: I have reviewed the above documentation for accuracy and completeness, and I agree with the above.  Orpheus Hayhurst K, PA-C

## 2024-01-26 NOTE — Patient Instructions (Addendum)

## 2024-01-27 ENCOUNTER — Ambulatory Visit: Payer: Self-pay | Admitting: Family Medicine

## 2024-01-30 ENCOUNTER — Ambulatory Visit: Admitting: Family Medicine

## 2024-01-30 VITALS — BP 110/62 | HR 78 | Temp 98.5°F | Ht 70.0 in | Wt 171.8 lb

## 2024-01-30 DIAGNOSIS — R197 Diarrhea, unspecified: Secondary | ICD-10-CM | POA: Diagnosis not present

## 2024-01-30 DIAGNOSIS — I959 Hypotension, unspecified: Secondary | ICD-10-CM

## 2024-01-30 NOTE — Assessment & Plan Note (Signed)
 Acute, lower blood pressures on lisinopril  likely given increased fluid loss in stool. Encourage p.o. intake of fluids.  Decrease lisinopril  to half tablet daily and if blood pressure remaining low hold altogether.  Return ER precautions provided.

## 2024-01-30 NOTE — Progress Notes (Signed)
 Has been going on for 3 weeks tomorrow. Not having normal stools.

## 2024-01-30 NOTE — Progress Notes (Signed)
 Patient ID: Dennis Martin, male    DOB: [birth year], 68 y.o.   MRN: [id number]  This visit was conducted in person.  BP 110/62   Pulse 78   Temp 98.5 F (36.9 C) (Oral)   Ht 5' 10 (1.778 m)   Wt 171 lb 12.8 oz (77.9 kg)   SpO2 98%   BMI 24.65 kg/m    CC:  Chief Complaint  Patient presents with   GI Problem    Subjective:   HPI: Dennis Martin is a 68 y.o. male presenting on 01/30/2024 for GI Problem  Reviewed last office visit with WENDI Aurora, NP regarding acute diarrhea. At that time diarrhea had been going on for 6 days with intermittent periumbilical pain. Evaluated with CBC: Initially showed leukocytosis on repeat that has resolved  complete metabolic panel unremarkable C. difficile: Negative GI panel PCR: Negative  Cologuard December 29, 2023 positive and patient referred to GI  Essential thrombocytosis followed by hematology    He has noted lower blood pressures recently at home.  Today was normal in the office on lisinopril  10 mg p.o. daily BP Readings from Last 3 Encounters:  01/30/24 110/62  01/26/24 (!) 87/54  01/16/24 122/60     He reports continue softer stools , semi-formed, sometime watery.  Urgency to defecate.  BMs 3 times last night, 1-2 times during a day.  No blood in stool.  Occ mild discomfort in generalize.. feeling like has to defecate... relieved  after BM.  Trying to  eat bland food.. doesn't change anything.. no back to eating normally.  Drinking lots of water.   New meds iron but started after.   Has lost 8 lbs. Wt Readings from Last 3 Encounters:  01/30/24 171 lb 12.8 oz (77.9 kg)  01/16/24 174 lb 9.6 oz (79.2 kg)  12/24/23 179 lb 6.4 oz (81.4 kg)     Relevant past medical, surgical, family and social history reviewed and updated as indicated. Interim medical history since our last visit reviewed. Allergies and medications reviewed and updated. Outpatient Medications Prior to Visit  Medication Sig Dispense Refill   aspirin  EC 81 MG  tablet Take 81 mg by mouth 2 (two) times daily.     atorvastatin  (LIPITOR) 10 MG tablet Take 1 tablet by mouth once daily 90 tablet 0   Cholecalciferol (VITAMIN D3) 50 MCG (2000 UT) TABS Take 50 mcg by mouth daily at 12 noon.     ferrous sulfate 325 (65 FE) MG EC tablet Take 325 mg by mouth as needed.     FLUoxetine  (PROZAC ) 20 MG capsule Take 1 capsule by mouth once daily 90 capsule 1   hydrocortisone  2.5 % cream Apply topically 2 (two) times daily as needed (Rash). 30 g 11   hydroxyurea  (HYDREA ) 500 MG capsule TAKE 1 CAPSULE BY MOUTH ONCE DAILY FOR 6 DAYS AND  2 CAPSULES  ON  FRIDAYS 105 capsule 2   lisinopril  (ZESTRIL ) 10 MG tablet Take 1 tablet by mouth once daily 90 tablet 0   Multiple Vitamins-Minerals (ONE-A-DAY MENS 50+) TABS Take 1 tablet by mouth daily.     NON FORMULARY CPAP-Setting 15     NON FORMULARY Place under the tongue in the morning, at noon, and at bedtime. .33 ml CBD Full Spectrum     tamsulosin  (FLOMAX ) 0.4 MG CAPS capsule 0.4 mg. 2 tablets a day at bedtime     Testosterone  Cypionate 200 MG/ML SOLN Inject 0.5 mLs into the muscle every 14 (fourteen) days.  No facility-administered medications prior to visit.     Per HPI unless specifically indicated in ROS section below Review of Systems  Constitutional:  Negative for fatigue and fever.  HENT:  Negative for ear pain.   Eyes:  Negative for pain.  Respiratory:  Negative for cough and shortness of breath.   Cardiovascular:  Negative for chest pain, palpitations and leg swelling.  Gastrointestinal:  Negative for abdominal pain.  Genitourinary:  Negative for dysuria.  Musculoskeletal:  Negative for arthralgias.  Neurological:  Negative for syncope, light-headedness and headaches.  Psychiatric/Behavioral:  Negative for dysphoric mood.    Objective:  BP 110/62   Pulse 78   Temp 98.5 F (36.9 C) (Oral)   Ht 5' 10 (1.778 m)   Wt 171 lb 12.8 oz (77.9 kg)   SpO2 98%   BMI 24.65 kg/m   Wt Readings from Last 3  Encounters:  01/30/24 171 lb 12.8 oz (77.9 kg)  01/16/24 174 lb 9.6 oz (79.2 kg)  12/24/23 179 lb 6.4 oz (81.4 kg)      Physical Exam Vitals reviewed.  Constitutional:      Appearance: He is well-developed.  HENT:     Head: Normocephalic.     Right Ear: Hearing normal.     Left Ear: Hearing normal.     Nose: Nose normal.  Neck:     Thyroid : No thyroid  mass or thyromegaly.     Vascular: No carotid bruit.     Trachea: Trachea normal.  Cardiovascular:     Rate and Rhythm: Normal rate and regular rhythm.     Pulses: Normal pulses.     Heart sounds: Heart sounds not distant. No murmur heard.    No friction rub. No gallop.     Comments: No peripheral edema Pulmonary:     Effort: Pulmonary effort is normal. No respiratory distress.     Breath sounds: Normal breath sounds.  Skin:    General: Skin is warm and dry.     Findings: No rash.  Psychiatric:        Speech: Speech normal.        Behavior: Behavior normal.        Thought Content: Thought content normal.       Results for orders placed or performed in visit on 01/26/24  CBC with Differential/Platelet   Collection Time: 01/26/24 10:52 AM  Result Value Ref Range   WBC 9.0 4.0 - 10.5 K/uL   RBC 4.95 4.22 - 5.81 Mil/uL   Hemoglobin 14.9 13.0 - 17.0 g/dL   HCT 53.3 60.9 - 47.9 %   MCV 94.2 78.0 - 100.0 fl   MCHC 32.0 30.0 - 36.0 g/dL   RDW 81.9 (H) 88.4 - 84.4 %   Platelets 381.0 150.0 - 400.0 K/uL   Neutrophils Relative % 84.4 (H) 43.0 - 77.0 %   Lymphocytes Relative 6.5 (L) 12.0 - 46.0 %   Monocytes Relative 7.5 3.0 - 12.0 %   Eosinophils Relative 0.8 0.0 - 5.0 %   Basophils Relative 0.8 0.0 - 3.0 %   Neutro Abs 7.6 1.4 - 7.7 K/uL   Lymphs Abs 0.6 (L) 0.7 - 4.0 K/uL   Monocytes Absolute 0.7 0.1 - 1.0 K/uL   Eosinophils Absolute 0.1 0.0 - 0.7 K/uL   Basophils Absolute 0.1 0.0 - 0.1 K/uL    Assessment and Plan  There are no diagnoses linked to this encounter.  No follow-ups on file.   Greig Ring, MD

## 2024-01-30 NOTE — Patient Instructions (Addendum)
 Decrease  lisinopril  to 1/2 tablet daily.SABRA if BP is till low hold altogether.  Start probiotic.SABRA Align or Culturelle.  Avoid greasy foods and gas forming.  Can use immodium off and on. Make colonoscopy referral as  planned given positive Cologuard.

## 2024-01-30 NOTE — Assessment & Plan Note (Signed)
 Acute, negative initial workup including c-Met, CBC, C. difficile and GI PCR panel. Patient did have positive Cologuard and has plans for scheduling diagnostic colonoscopy when able.  The symptoms at this point sound most consistent with irritable bowel syndrome.  Patient is under a lot of stress caregiving for his wife.  Start probiotic.SABRA Align or Culturelle.  Avoid greasy foods and gas forming.  Can use immodium off and on. Make colonoscopy referral as  planned given positive Cologuard.

## 2024-02-10 ENCOUNTER — Encounter: Payer: Self-pay | Admitting: Family Medicine

## 2024-02-25 ENCOUNTER — Other Ambulatory Visit: Payer: Self-pay | Admitting: Family Medicine

## 2024-02-25 DIAGNOSIS — I10 Essential (primary) hypertension: Secondary | ICD-10-CM

## 2024-03-08 ENCOUNTER — Other Ambulatory Visit: Payer: Self-pay | Admitting: Family Medicine

## 2024-03-08 DIAGNOSIS — I10 Essential (primary) hypertension: Secondary | ICD-10-CM

## 2024-03-08 MED ORDER — ATORVASTATIN CALCIUM 10 MG PO TABS: 10.0000 mg | ORAL_TABLET | Freq: Every day | ORAL | 1 refills | Status: AC

## 2024-03-08 MED ORDER — FLUOXETINE HCL 20 MG PO CAPS: 20.0000 mg | ORAL_CAPSULE | Freq: Every day | ORAL | 1 refills | Status: AC

## 2024-03-08 MED ORDER — LISINOPRIL 10 MG PO TABS: 10.0000 mg | ORAL_TABLET | Freq: Every day | ORAL | 1 refills | Status: AC

## 2024-03-10 ENCOUNTER — Ambulatory Visit

## 2024-03-10 VITALS — Ht 71.0 in | Wt 172.0 lb

## 2024-03-10 DIAGNOSIS — Z1211 Encounter for screening for malignant neoplasm of colon: Secondary | ICD-10-CM

## 2024-03-10 MED ORDER — NA SULFATE-K SULFATE-MG SULF 17.5-3.13-1.6 GM/177ML PO SOLN: 1.0000 | Freq: Once | ORAL | 0 refills | Status: AC

## 2024-03-10 NOTE — Progress Notes (Signed)

## 2024-03-12 ENCOUNTER — Other Ambulatory Visit: Payer: Self-pay | Admitting: Physician Assistant

## 2024-03-12 DIAGNOSIS — L219 Seborrheic dermatitis, unspecified: Secondary | ICD-10-CM

## 2024-03-29 ENCOUNTER — Encounter: Payer: Self-pay | Admitting: Internal Medicine

## 2024-03-31 ENCOUNTER — Ambulatory Visit: Admitting: Internal Medicine

## 2024-03-31 ENCOUNTER — Encounter: Payer: Self-pay | Admitting: Internal Medicine

## 2024-03-31 VITALS — BP 111/76 | HR 60 | Temp 97.3°F | Resp 13 | Ht 71.0 in | Wt 172.0 lb

## 2024-03-31 DIAGNOSIS — D122 Benign neoplasm of ascending colon: Secondary | ICD-10-CM | POA: Diagnosis not present

## 2024-03-31 DIAGNOSIS — K5730 Diverticulosis of large intestine without perforation or abscess without bleeding: Secondary | ICD-10-CM | POA: Diagnosis not present

## 2024-03-31 DIAGNOSIS — Z1211 Encounter for screening for malignant neoplasm of colon: Secondary | ICD-10-CM

## 2024-03-31 DIAGNOSIS — D125 Benign neoplasm of sigmoid colon: Secondary | ICD-10-CM

## 2024-03-31 MED ORDER — SODIUM CHLORIDE 0.9 % IV SOLN: 500.0000 mL | Freq: Once | INTRAVENOUS | Status: DC

## 2024-03-31 NOTE — Patient Instructions (Addendum)
 Continue present medications. Await pathology results. Repeat colonoscopy is recommended for surveillance. The colonoscopy date will be determined after pathology results from today's exam become available for review.  Please read handouts provided  YOU HAD AN ENDOSCOPIC PROCEDURE TODAY AT THE [facility]:   Refer to the procedure report that was given to you for any specific questions about what was found during the examination.  If the procedure report does not answer your questions, please call your gastroenterologist to clarify.  If you requested that your care partner not be given the details of your procedure findings, then the procedure report has been included in a sealed envelope for you to review at your convenience later.  YOU SHOULD EXPECT: Some feelings of bloating in the abdomen. Passage of more gas than usual.  Walking can help get rid of the air that was put into your GI tract during the procedure and reduce the bloating. If you had a lower endoscopy (such as a colonoscopy or flexible sigmoidoscopy) you may notice spotting of blood in your stool or on the toilet paper. If you underwent a bowel prep for your procedure, you may not have a normal bowel movement for a few days.  Please Note:  You might notice some irritation and congestion in your nose or some drainage.  This is from the oxygen used during your procedure.  There is no need for concern and it should clear up in a day or so.  SYMPTOMS TO REPORT IMMEDIATELY:  Following lower endoscopy (colonoscopy or flexible sigmoidoscopy):  Excessive amounts of blood in the stool  Significant tenderness or worsening of abdominal pains  Swelling of the abdomen that is new, acute  Fever of 100F or higher  For urgent or emergent issues, a gastroenterologist can be reached at any hour by calling [phone]. Do not use MyChart messaging for urgent concerns.    DIET:  We do recommend a small meal at first, but then you  may proceed to your regular diet.  Drink plenty of fluids but you should avoid alcoholic beverages for 24 hours.  ACTIVITY:  You should plan to take it easy for the rest of today and you should NOT DRIVE or use heavy machinery until tomorrow (because of the sedation medicines used during the test).    FOLLOW UP: Our staff will call the number listed on your records the next business day following your procedure.  We will call around 7:15- 8:00 am to check on you and address any questions or concerns that you may have regarding the information given to you following your procedure. If we do not reach you, we will leave a message.     If any biopsies were taken you will be contacted by phone or by letter within the next 1-3 weeks.  Please call us  at [phone] if you have not heard about the biopsies in 3 weeks.    SIGNATURES/CONFIDENTIALITY: You and/or your care partner have signed paperwork which will be entered into your electronic medical record.  These signatures attest to the fact that that the information above on your After Visit Summary has been reviewed and is understood.  Full responsibility of the confidentiality of this discharge information lies with you and/or your care-partner.

## 2024-03-31 NOTE — Progress Notes (Signed)
 To pacu, VSS. Report to [email or web address].tb

## 2024-03-31 NOTE — Progress Notes (Signed)
 Pt's states no medical or surgical changes since previsit or office visit.

## 2024-03-31 NOTE — Progress Notes (Signed)
 "   GASTROENTEROLOGY PROCEDURE H&P NOTE   Primary Care Physician: [name], MD    Reason for Procedure:  Colon cancer screening  Plan:    Colonoscopy  Patient is appropriate for endoscopic procedure(s) in the ambulatory ([facility]) setting.  The nature of the procedure, as well as the risks, benefits, and alternatives were carefully and thoroughly reviewed with the patient. Ample time for discussion and questions allowed.  All questions were answered. The patient understood, was satisfied, and agreed with the plan to proceed.    HPI: Dennis Martin is a 69 y.o. male who presents for colonoscopy.  Medical history as below.  Tolerated the prep.  No recent chest pain or shortness of breath.  No abdominal pain today.  Past Medical History:  Diagnosis Date   Anxiety    hx of - 10 years ago   Arthritis 2001   both arthritis, wrist, hands    BPH (benign prostatic hyperplasia)    Cataract 04/05/17   beginning stage   Depression    DVT of leg (deep venous thrombosis) (HCC) 03/26/1999   post knee arthroscopy,also complicated by infection, PICC line used for long term antibiotics, was on coumadin x's 6 months   Essential thrombocytosis (HCC)    GERD (gastroesophageal reflux disease) 2007   occasional   Headache    relative to testosterone  level, it had been a problem, improved with clomid    History of stress test 03/25/2006   done in [place]  ,nuclear stress test- told that it was normal   Hyperlipidemia    Hypertension 2007   on Lisinopril  10 mg   Peripheral vascular disease    Sleep apnea 1999   did surgery for the problem   Spleen enlarged    Thrombocythemia    monitored by Dr. Gatha    Past Surgical History:  Procedure Laterality Date   EYE SURGERY     chalazion on both eyelids   JOINT REPLACEMENT  05/12/15,  07/17/15   right knee, left knee   KNEE SURGERY Bilateral 2001   NASAL SINUS SURGERY     right shoulder surgery     TONSILLECTOMY     TOTAL KNEE  ARTHROPLASTY Right 05/22/2015   TOTAL KNEE ARTHROPLASTY Right 05/22/2015   Procedure: TOTAL KNEE ARTHROPLASTY;  Surgeon: Dempsey Sensor, MD;  Location: [facility] OR;  Service: Orthopedics;  Laterality: Right;   TOTAL KNEE ARTHROPLASTY Left 07/17/2015   TOTAL KNEE ARTHROPLASTY Left 07/17/2015   Procedure: TOTAL KNEE ARTHROPLASTY;  Surgeon: Dempsey Sensor, MD;  Location: [facility] OR;  Service: Orthopedics;  Laterality: Left;   TRIGGER FINGER RELEASE Left    ULNAR NERVE TRANSPOSITION  2007   left    Prior to Admission medications  Medication Sig Start Date End Date Taking? Authorizing Provider  aspirin  EC 81 MG tablet Take 81 mg by mouth 2 (two) times daily.   Yes [provider]  atorvastatin  (LIPITOR) 10 MG tablet Take 1 tablet (10 mg total) by mouth daily. 03/08/24  Yes Bedsole, Amy E, MD  Cholecalciferol (VITAMIN D3) 50 MCG (2000 UT) TABS Take 50 mcg by mouth daily at 12 noon.   Yes [provider]  FLUoxetine  (PROZAC ) 20 MG capsule Take 1 capsule (20 mg total) by mouth daily. 03/08/24  Yes Bedsole, Amy E, MD  hydrocortisone  2.5 % cream Apply topically 2 (two) times daily as needed (Rash). 10/28/23  Yes Sandridge, Brenda K, PA-C  hydroxyurea  (HYDREA ) 500 MG capsule TAKE 1 CAPSULE BY MOUTH ONCE DAILY FOR  6 DAYS AND  2 CAPSULES  ON  FRIDAYS 12/24/23  Yes Heilingoetter, Cassandra L, PA-C  ketoconazole  (NIZORAL ) 2 % shampoo Apply 1 Application topically 2 (two) times a week. 03/15/24  Yes Sandridge, Erminio K, PA-C  lisinopril  (ZESTRIL ) 10 MG tablet Take 1 tablet (10 mg total) by mouth daily. 03/08/24  Yes Bedsole, Amy E, MD  Multiple Vitamins-Minerals (ONE-A-DAY MENS 50+) TABS Take 1 tablet by mouth daily.   Yes [provider]  NON FORMULARY CPAP-Setting 15   Yes [provider]  NON FORMULARY Place under the tongue in the morning, at noon, and at bedtime. .33 ml CBD Full Spectrum 01/13/20  Yes [provider]  tamsulosin  (FLOMAX ) 0.4 MG CAPS capsule 0.4 mg. 2 tablets a  day at bedtime 07/07/18  Yes [provider]  ferrous sulfate 325 (65 FE) MG EC tablet Take 325 mg by mouth as needed.    [provider]  Testosterone  Cypionate 200 MG/ML SOLN Inject 0.5 mLs into the muscle every 14 (fourteen) days. 06/13/20   [provider]    Current Outpatient Medications  Medication Sig Dispense Refill   aspirin  EC 81 MG tablet Take 81 mg by mouth 2 (two) times daily.     atorvastatin  (LIPITOR) 10 MG tablet Take 1 tablet (10 mg total) by mouth daily. 90 tablet 1   Cholecalciferol (VITAMIN D3) 50 MCG (2000 UT) TABS Take 50 mcg by mouth daily at 12 noon.     FLUoxetine  (PROZAC ) 20 MG capsule Take 1 capsule (20 mg total) by mouth daily. 90 capsule 1   hydrocortisone  2.5 % cream Apply topically 2 (two) times daily as needed (Rash). 30 g 11   hydroxyurea  (HYDREA ) 500 MG capsule TAKE 1 CAPSULE BY MOUTH ONCE DAILY FOR 6 DAYS AND  2 CAPSULES  ON  FRIDAYS 105 capsule 2   ketoconazole  (NIZORAL ) 2 % shampoo Apply 1 Application topically 2 (two) times a week. 360 mL 11   lisinopril  (ZESTRIL ) 10 MG tablet Take 1 tablet (10 mg total) by mouth daily. 90 tablet 1   Multiple Vitamins-Minerals (ONE-A-DAY MENS 50+) TABS Take 1 tablet by mouth daily.     NON FORMULARY CPAP-Setting 15     NON FORMULARY Place under the tongue in the morning, at noon, and at bedtime. .33 ml CBD Full Spectrum     tamsulosin  (FLOMAX ) 0.4 MG CAPS capsule 0.4 mg. 2 tablets a day at bedtime     ferrous sulfate 325 (65 FE) MG EC tablet Take 325 mg by mouth as needed.     Testosterone  Cypionate 200 MG/ML SOLN Inject 0.5 mLs into the muscle every 14 (fourteen) days.     Current Facility-Administered Medications  Medication Dose Route Frequency Provider Last Rate Last Admin   0.9 %  sodium chloride  infusion  500 mL Intravenous Once Damaree Sargent, Gordy HERO, MD        Allergies as of 03/31/2024 - Review Complete 03/31/2024  Allergen Reaction Noted   Keflex  [cephalexin ] Rash 03/22/2013    Family  History  Problem Relation Age of Onset   Heart attack Mother 6   Arthritis Mother    Heart disease Mother    Hypertension Mother    Varicose Veins Mother    Arthritis Father    Hearing loss Father    Heart disease Father    Hypertension Father    Other Brother        brain aneyurism   Colon polyps Brother    Early death  Brother    Alcohol abuse Brother    Arthritis Brother    Intellectual disability Brother    Learning disabilities Brother    Colon polyps Paternal Aunt    Colon cancer Paternal Aunt 40   Stomach cancer Neg Hx    Esophageal cancer Neg Hx    Rectal cancer Neg Hx     Social History   Socioeconomic History   Marital status: Married    Spouse name: Science Writer   Number of children: 2   Years of education: high school   Highest education level: Some college, no degree  Occupational History   Not on file  Tobacco Use   Smoking status: Former    Current packs/day: 0.00    Average packs/day: 1.1 packs/day for 28.3 years (30.0 ttl pk-yrs)    Types: Cigarettes    Start date: 05/05/1978    Quit date: 05/06/2003    Years since quitting: 20.9   Smokeless tobacco: Never   Tobacco comments:    Quit 2005  Vaping Use   Vaping status: Never Used  Substance and Sexual Activity   Alcohol use: Yes    Alcohol/week: 1.0 standard drink of alcohol    Types: 1 Cans of beer per week    Comment: stopped drinking when I started antidepressant.   Drug use: No   Sexual activity: Not Currently    Birth control/protection: None  Other Topics Concern   Not on file  Social History Narrative   05/11/19   From: [place]  originally, moved [facility] in [place]    Living: with Resa, wife since 2002   Work: data center management work      Family: 2 sons - Lonni and Franky - no grandchildren       Enjoys: target shooting      Exercise: multiple joint issues has limited   Diet: normal      Safety   Seat belts: Yes    Guns: Yes  and secure   Safe in relationships: Yes     Social Drivers of Health   Tobacco Use: Medium Risk (03/31/2024)   Patient History    Smoking Tobacco Use: Former    Smokeless Tobacco Use: Never    Passive Exposure: Not on Actuary Strain: Low Risk (01/15/2024)   Overall Financial Resource Strain (CARDIA)    Difficulty of Paying Living Expenses: Not hard at all  Food Insecurity: No Food Insecurity (01/15/2024)   Epic    Worried About Radiation Protection Practitioner of Food in the Last Year: Never true    Ran Out of Food in the Last Year: Never true  Transportation Needs: No Transportation Needs (01/15/2024)   Epic    Lack of Transportation (Medical): No    Lack of Transportation (Non-Medical): No  Physical Activity: Inactive (01/15/2024)   Exercise Vital Sign    Days of Exercise per Week: 0 days    Minutes of Exercise per Session: Not on file  Stress: Stress Concern Present (01/15/2024)   [employer] of Occupational Health - Occupational Stress Questionnaire    Feeling of Stress: To some extent  Social Connections: Unknown (01/15/2024)   Social Connection and Isolation Panel    Frequency of Communication with Friends and Family: Three times a week    Frequency of Social Gatherings with Friends and Family: Twice a week    Attends Religious Services: Not on Marketing Executive or Organizations: No    Attends Banker  Meetings: Not on file    Marital Status: Married  Recent Concern: Social Connections - Moderately Isolated (12/07/2023)   Social Connection and Isolation Panel    Frequency of Communication with Friends and Family: Three times a week    Frequency of Social Gatherings with Friends and Family: Never    Attends Religious Services: Never    Database Administrator or Organizations: No    Attends Engineer, Structural: Not on file    Marital Status: Married  Catering Manager Violence: Not on file  Depression (PHQ2-9): Medium Risk (01/16/2024)   Depression (PHQ2-9)    PHQ-2 Score: 5   Alcohol Screen: Low Risk (01/15/2024)   Alcohol Screen    Last Alcohol Screening Score (AUDIT): 4  Housing: Low Risk (01/15/2024)   Epic    Unable to Pay for Housing in the Last Year: No    Number of Times Moved in the Last Year: 0    Homeless in the Last Year: No  Utilities: Not on file  Health Literacy: Not on file    Physical Exam: Vital signs in last 24 hours: @BP  124/77   Pulse 76   Temp (!) 97.3 F (36.3 C)   Ht 5' 11 (1.803 m)   Wt 172 lb (78 kg)   SpO2 97%   BMI 23.99 kg/m  GEN: NAD EYE: Sclerae anicteric ENT: MMM CV: Non-tachycardic Pulm: CTA b/l GI: Soft, NT/ND NEURO:  Alert & Oriented x 3   Gordy Starch, MD [facility]  03/31/2024 10:25 AM  "

## 2024-03-31 NOTE — Op Note (Signed)
 [facility] Patient Name: Dennis Martin Procedure Date: 03/31/2024 10:04 AM MRN: [id number] Endoscopist: Gordy CHRISTELLA Starch , MD, [id number] Age: 69 Referring MD:  Date of Birth: [birth year] Gender: Male Account #: [id number] Procedure:                Colonoscopy Indications:              Screening for colorectal malignant neoplasm, Last                            colonoscopy: 2014 Medicines:                Monitored Anesthesia Care Procedure:                Pre-Anesthesia Assessment:                           - Prior to the procedure, a History and Physical                            was performed, and patient medications and                            allergies were reviewed. The patient's tolerance of                            previous anesthesia was also reviewed. The risks                            and benefits of the procedure and the sedation                            options and risks were discussed with the patient.                            All questions were answered, and informed consent                            was obtained. Prior Anticoagulants: The patient has                            taken no anticoagulant or antiplatelet agents. ASA                            Grade Assessment: II - A patient with mild systemic                            disease. After reviewing the risks and benefits,                            the patient was deemed in satisfactory condition to                            undergo the procedure.  After obtaining informed consent, the colonoscope                            was passed under direct vision. Throughout the                            procedure, the patient's blood pressure, pulse, and                            oxygen saturations were monitored continuously. The                            Olympus Scope SN: [id number] was introduced through                            the anus and advanced to the cecum, identified  by                            appendiceal orifice and ileocecal valve. The                            colonoscopy was performed without difficulty. The                            patient tolerated the procedure well. The quality                            of the bowel preparation was good. The ileocecal                            valve, appendiceal orifice, and rectum were                            photographed. Scope In: 10:35:16 AM Scope Out: 10:49:06 AM Scope Withdrawal Time: 0 hours 10 minutes 34 seconds  Total Procedure Duration: 0 hours 13 minutes 50 seconds  Findings:                 The digital rectal exam was normal.                           A 7 mm polyp was found in the ascending colon. The                            polyp was sessile. The polyp was removed with a                            cold snare. Resection and retrieval were complete.                           A 3 mm polyp was found in the sigmoid colon. The                            polyp was sessile. The  polyp was removed with a                            cold snare. Resection and retrieval were complete.                           Multiple small-mouthed diverticula were found in                            the sigmoid colon.                           The retroflexed view of the distal rectum and anal                            verge was normal and showed no anal or rectal                            abnormalities. Complications:            No immediate complications. Estimated Blood Loss:     Estimated blood loss: none. Impression:               - One 7 mm polyp in the ascending colon, removed                            with a cold snare. Resected and retrieved.                           - One 3 mm polyp in the sigmoid colon, removed with                            a cold snare. Resected and retrieved.                           - Mild diverticulosis in the sigmoid colon.                           - The distal rectum  and anal verge are normal on                            retroflexion view. Recommendation:           - Patient has a contact number available for                            emergencies. The signs and symptoms of potential                            delayed complications were discussed with the                            patient. Return to normal activities tomorrow.  Written discharge instructions were provided to the                            patient.                           - Resume previous diet.                           - Continue present medications.                           - Await pathology results.                           - Repeat colonoscopy is recommended for                            surveillance. The colonoscopy date will be                            determined after pathology results from today's                            exam become available for review. Gordy CHRISTELLA Starch, MD 03/31/2024 10:55:44 AM This report has been signed electronically.

## 2024-03-31 NOTE — Progress Notes (Signed)
 Called to room to assist during endoscopic procedure.  Patient ID and intended procedure confirmed with present staff. Received instructions for my participation in the procedure from the performing physician.

## 2024-04-01 ENCOUNTER — Telehealth: Payer: Self-pay | Admitting: *Deleted

## 2024-04-01 NOTE — Telephone Encounter (Signed)
LVM for post procedure call.

## 2024-04-21 ENCOUNTER — Ambulatory Visit: Payer: Self-pay | Admitting: Internal Medicine

## 2024-04-21 LAB — SURGICAL PATHOLOGY

## 2024-06-23 ENCOUNTER — Ambulatory Visit: Admitting: Physician Assistant

## 2024-06-23 ENCOUNTER — Other Ambulatory Visit

## 2024-07-26 ENCOUNTER — Ambulatory Visit: Admitting: Physician Assistant
